# Patient Record
Sex: Female | Born: 1960
Health system: Southern US, Community
[De-identification: ages and names within clinical notes are randomized; demographics above are authoritative.]

## PROBLEM LIST (undated history)

## (undated) DIAGNOSIS — E785 Hyperlipidemia, unspecified: Secondary | ICD-10-CM

## (undated) DIAGNOSIS — K219 Gastro-esophageal reflux disease without esophagitis: Secondary | ICD-10-CM

## (undated) DIAGNOSIS — E079 Disorder of thyroid, unspecified: Secondary | ICD-10-CM

## (undated) DIAGNOSIS — C50919 Malignant neoplasm of unspecified site of unspecified female breast: Secondary | ICD-10-CM

## (undated) DIAGNOSIS — E039 Hypothyroidism, unspecified: Secondary | ICD-10-CM

## (undated) HISTORY — DX: Malignant neoplasm of unspecified site of unspecified female breast: C50.919

## (undated) HISTORY — DX: Hyperlipidemia, unspecified: E78.5

## (undated) HISTORY — DX: Disorder of thyroid, unspecified: E07.9

## (undated) HISTORY — DX: Gastro-esophageal reflux disease without esophagitis: K21.9

---

## 1998-08-23 ENCOUNTER — Encounter: Admission: RE | Admit: 1998-08-23 | Discharge: 1998-11-21 | Payer: Self-pay | Admitting: Gynecology

## 1998-10-24 ENCOUNTER — Inpatient Hospital Stay (HOSPITAL_COMMUNITY): Admission: AD | Admit: 1998-10-24 | Discharge: 1998-10-26 | Payer: Self-pay | Admitting: Obstetrics and Gynecology

## 1999-12-04 ENCOUNTER — Other Ambulatory Visit: Admission: RE | Admit: 1999-12-04 | Discharge: 1999-12-04 | Payer: Self-pay | Admitting: Obstetrics and Gynecology

## 2000-03-17 ENCOUNTER — Encounter: Admission: RE | Admit: 2000-03-17 | Discharge: 2000-03-17 | Payer: Self-pay | Admitting: Internal Medicine

## 2000-03-17 ENCOUNTER — Encounter: Payer: Self-pay | Admitting: Internal Medicine

## 2002-03-20 ENCOUNTER — Other Ambulatory Visit: Admission: RE | Admit: 2002-03-20 | Discharge: 2002-03-20 | Payer: Self-pay | Admitting: Obstetrics and Gynecology

## 2003-05-04 ENCOUNTER — Other Ambulatory Visit: Admission: RE | Admit: 2003-05-04 | Discharge: 2003-05-04 | Payer: Self-pay | Admitting: *Deleted

## 2004-05-30 ENCOUNTER — Other Ambulatory Visit: Admission: RE | Admit: 2004-05-30 | Discharge: 2004-05-30 | Payer: Self-pay | Admitting: Obstetrics and Gynecology

## 2005-08-04 ENCOUNTER — Other Ambulatory Visit: Admission: RE | Admit: 2005-08-04 | Discharge: 2005-08-04 | Payer: Self-pay | Admitting: Obstetrics and Gynecology

## 2006-11-23 ENCOUNTER — Other Ambulatory Visit: Admission: RE | Admit: 2006-11-23 | Discharge: 2006-11-23 | Payer: Self-pay | Admitting: Obstetrics and Gynecology

## 2007-09-27 ENCOUNTER — Encounter: Admission: RE | Admit: 2007-09-27 | Discharge: 2007-09-27 | Payer: Self-pay | Admitting: Internal Medicine

## 2008-01-17 ENCOUNTER — Other Ambulatory Visit: Admission: RE | Admit: 2008-01-17 | Discharge: 2008-01-17 | Payer: Self-pay | Admitting: Obstetrics & Gynecology

## 2008-12-18 ENCOUNTER — Ambulatory Visit: Payer: Self-pay | Admitting: Internal Medicine

## 2009-06-24 ENCOUNTER — Ambulatory Visit: Payer: Self-pay | Admitting: Internal Medicine

## 2010-01-14 ENCOUNTER — Ambulatory Visit: Payer: Self-pay | Admitting: Internal Medicine

## 2010-04-18 ENCOUNTER — Ambulatory Visit: Payer: Self-pay | Admitting: Internal Medicine

## 2010-07-22 ENCOUNTER — Ambulatory Visit: Payer: Self-pay | Admitting: Internal Medicine

## 2010-12-30 ENCOUNTER — Encounter (INDEPENDENT_AMBULATORY_CARE_PROVIDER_SITE_OTHER): Payer: 59 | Admitting: Internal Medicine

## 2010-12-30 DIAGNOSIS — E559 Vitamin D deficiency, unspecified: Secondary | ICD-10-CM

## 2010-12-30 DIAGNOSIS — E785 Hyperlipidemia, unspecified: Secondary | ICD-10-CM

## 2010-12-30 DIAGNOSIS — E039 Hypothyroidism, unspecified: Secondary | ICD-10-CM

## 2011-04-13 ENCOUNTER — Other Ambulatory Visit: Payer: Self-pay | Admitting: Internal Medicine

## 2011-06-14 ENCOUNTER — Other Ambulatory Visit: Payer: Self-pay | Admitting: Internal Medicine

## 2011-06-24 ENCOUNTER — Encounter: Payer: Self-pay | Admitting: Internal Medicine

## 2011-06-26 ENCOUNTER — Encounter: Payer: Self-pay | Admitting: Internal Medicine

## 2011-06-26 ENCOUNTER — Ambulatory Visit (INDEPENDENT_AMBULATORY_CARE_PROVIDER_SITE_OTHER): Payer: 59 | Admitting: Internal Medicine

## 2011-06-26 VITALS — BP 134/78 | HR 80 | Temp 97.9°F | Ht 61.25 in | Wt 131.0 lb

## 2011-06-26 DIAGNOSIS — Z23 Encounter for immunization: Secondary | ICD-10-CM

## 2011-06-26 DIAGNOSIS — E559 Vitamin D deficiency, unspecified: Secondary | ICD-10-CM

## 2011-06-26 DIAGNOSIS — E039 Hypothyroidism, unspecified: Secondary | ICD-10-CM

## 2011-06-26 DIAGNOSIS — E785 Hyperlipidemia, unspecified: Secondary | ICD-10-CM

## 2011-06-26 DIAGNOSIS — K219 Gastro-esophageal reflux disease without esophagitis: Secondary | ICD-10-CM

## 2011-06-26 LAB — TSH: TSH: 0.808 u[IU]/mL (ref 0.350–4.500)

## 2011-06-28 ENCOUNTER — Encounter: Payer: Self-pay | Admitting: Internal Medicine

## 2011-06-29 DIAGNOSIS — E785 Hyperlipidemia, unspecified: Secondary | ICD-10-CM | POA: Insufficient documentation

## 2011-06-29 DIAGNOSIS — K219 Gastro-esophageal reflux disease without esophagitis: Secondary | ICD-10-CM | POA: Insufficient documentation

## 2011-06-29 DIAGNOSIS — E039 Hypothyroidism, unspecified: Secondary | ICD-10-CM | POA: Insufficient documentation

## 2011-06-29 DIAGNOSIS — E559 Vitamin D deficiency, unspecified: Secondary | ICD-10-CM | POA: Insufficient documentation

## 2011-06-29 NOTE — Progress Notes (Signed)
  Subjective:    Patient ID: Christie Molina, female    DOB: July 25, 1961, 50 y.o.   MRN: [id number]  HPI 50 year old white female with history of vitamin D deficiency, hyperlipidemia, hypothyroidism, GE reflux for six-month recheck. Doing well with these health issues without complaints. Having issues with mother who has dementia. It has been stressful for her.    Review of Systems     Objective:   Physical Exam neck is supple without thyromegaly; chest clear to auscultation; cardiac exam regular rate and rhythm normal S1 and S2        Assessment & Plan:  Hypothyroidism  GE reflux  Hyperlipidemia  History of vitamin D deficiency  Plan is to continue same dose of Synthroid. In 6 months she will have physical examination and fasting lab studies

## 2011-06-30 ENCOUNTER — Other Ambulatory Visit: Payer: Self-pay | Admitting: Internal Medicine

## 2011-07-01 ENCOUNTER — Other Ambulatory Visit: Payer: Self-pay

## 2011-07-01 MED ORDER — PROTONIX 40 MG PO TBEC: 40.0000 mg | DELAYED_RELEASE_TABLET | Freq: Every day | ORAL | Status: DC

## 2011-07-14 ENCOUNTER — Other Ambulatory Visit: Payer: Self-pay | Admitting: Internal Medicine

## 2011-08-13 ENCOUNTER — Other Ambulatory Visit: Payer: Self-pay | Admitting: Internal Medicine

## 2011-09-11 ENCOUNTER — Other Ambulatory Visit: Payer: Self-pay | Admitting: Internal Medicine

## 2011-09-28 ENCOUNTER — Encounter: Payer: Self-pay | Admitting: Internal Medicine

## 2011-12-23 ENCOUNTER — Other Ambulatory Visit: Payer: Self-pay | Admitting: Internal Medicine

## 2011-12-23 LAB — TSH: TSH: 0.906 u[IU]/mL (ref 0.350–4.500)

## 2011-12-23 LAB — CBC WITH DIFFERENTIAL/PLATELET
Basophils Absolute: 0.1 10*3/uL (ref 0.0–0.1)
Eosinophils Absolute: 0.1 10*3/uL (ref 0.0–0.7)
Eosinophils Relative: 1 % (ref 0–5)
MCH: 29.5 pg (ref 26.0–34.0)
MCV: 91.7 fL (ref 78.0–100.0)
Neutrophils Relative %: 64 % (ref 43–77)
Platelets: 195 10*3/uL (ref 150–400)
RBC: 4.47 MIL/uL (ref 3.87–5.11)
RDW: 13 % (ref 11.5–15.5)
WBC: 5 10*3/uL (ref 4.0–10.5)

## 2011-12-23 LAB — COMPREHENSIVE METABOLIC PANEL
ALT: 11 U/L (ref 0–35)
AST: 16 U/L (ref 0–37)
Alkaline Phosphatase: 35 U/L — ABNORMAL LOW (ref 39–117)
Creat: 0.79 mg/dL (ref 0.50–1.10)
Sodium: 139 mEq/L (ref 135–145)
Total Bilirubin: 0.5 mg/dL (ref 0.3–1.2)
Total Protein: 6.3 g/dL (ref 6.0–8.3)

## 2011-12-23 LAB — LIPID PANEL
Cholesterol: 179 mg/dL (ref 0–200)
LDL Cholesterol: 100 mg/dL — ABNORMAL HIGH (ref 0–99)
Total CHOL/HDL Ratio: 2.7 Ratio
VLDL: 12 mg/dL (ref 0–40)

## 2011-12-24 ENCOUNTER — Other Ambulatory Visit: Payer: 59 | Admitting: Internal Medicine

## 2011-12-24 LAB — VITAMIN D 25 HYDROXY (VIT D DEFICIENCY, FRACTURES): Vit D, 25-Hydroxy: 21 ng/mL — ABNORMAL LOW (ref 30–89)

## 2011-12-25 ENCOUNTER — Other Ambulatory Visit: Payer: Self-pay | Admitting: Internal Medicine

## 2011-12-25 ENCOUNTER — Encounter: Payer: Self-pay | Admitting: Internal Medicine

## 2011-12-25 ENCOUNTER — Ambulatory Visit (INDEPENDENT_AMBULATORY_CARE_PROVIDER_SITE_OTHER): Payer: 59 | Admitting: Internal Medicine

## 2011-12-25 DIAGNOSIS — E785 Hyperlipidemia, unspecified: Secondary | ICD-10-CM

## 2011-12-25 DIAGNOSIS — Z87898 Personal history of other specified conditions: Secondary | ICD-10-CM

## 2011-12-25 DIAGNOSIS — F419 Anxiety disorder, unspecified: Secondary | ICD-10-CM

## 2011-12-25 DIAGNOSIS — K219 Gastro-esophageal reflux disease without esophagitis: Secondary | ICD-10-CM

## 2011-12-25 DIAGNOSIS — Z0000 Encounter for general adult medical examination without abnormal findings: Secondary | ICD-10-CM

## 2011-12-25 DIAGNOSIS — F411 Generalized anxiety disorder: Secondary | ICD-10-CM

## 2011-12-25 DIAGNOSIS — Z8639 Personal history of other endocrine, nutritional and metabolic disease: Secondary | ICD-10-CM

## 2011-12-25 DIAGNOSIS — E039 Hypothyroidism, unspecified: Secondary | ICD-10-CM

## 2011-12-25 LAB — POCT URINALYSIS DIPSTICK
Bilirubin, UA: NEGATIVE
Ketones, UA: NEGATIVE
Protein, UA: NEGATIVE

## 2011-12-27 LAB — URINE CULTURE: Colony Count: >=100000

## 2011-12-27 NOTE — Patient Instructions (Signed)
Continue same medications and return in 6 months 
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
## 2011-12-29 ENCOUNTER — Encounter: Payer: Self-pay | Admitting: Internal Medicine

## 2012-01-11 ENCOUNTER — Other Ambulatory Visit: Payer: Self-pay | Admitting: Internal Medicine

## 2012-06-27 ENCOUNTER — Other Ambulatory Visit: Payer: Self-pay

## 2012-06-27 MED ORDER — PROTONIX 40 MG PO TBEC: 40.0000 mg | DELAYED_RELEASE_TABLET | Freq: Every day | ORAL | Status: DC

## 2012-07-13 ENCOUNTER — Other Ambulatory Visit: Payer: Self-pay | Admitting: Internal Medicine

## 2012-07-13 LAB — LIPID PANEL
LDL Cholesterol: 125 mg/dL — ABNORMAL HIGH (ref 0–99)
Total CHOL/HDL Ratio: 3.4 Ratio

## 2012-07-13 LAB — HEPATIC FUNCTION PANEL
AST: 13 U/L (ref 0–37)
Bilirubin, Direct: 0.1 mg/dL (ref 0.0–0.3)
Total Bilirubin: 0.6 mg/dL (ref 0.3–1.2)

## 2012-07-13 LAB — TSH: TSH: 1.068 u[IU]/mL (ref 0.350–4.500)

## 2012-07-18 ENCOUNTER — Ambulatory Visit (INDEPENDENT_AMBULATORY_CARE_PROVIDER_SITE_OTHER): Payer: 59 | Admitting: Internal Medicine

## 2012-07-18 ENCOUNTER — Encounter: Payer: Self-pay | Admitting: Internal Medicine

## 2012-07-18 VITALS — BP 128/76 | HR 80 | Temp 98.7°F | Wt 135.0 lb

## 2012-07-18 DIAGNOSIS — E785 Hyperlipidemia, unspecified: Secondary | ICD-10-CM

## 2012-07-18 DIAGNOSIS — Z23 Encounter for immunization: Secondary | ICD-10-CM

## 2012-07-18 DIAGNOSIS — E039 Hypothyroidism, unspecified: Secondary | ICD-10-CM

## 2012-07-18 DIAGNOSIS — Z733 Stress, not elsewhere classified: Secondary | ICD-10-CM

## 2012-07-18 DIAGNOSIS — K219 Gastro-esophageal reflux disease without esophagitis: Secondary | ICD-10-CM

## 2012-07-18 DIAGNOSIS — F439 Reaction to severe stress, unspecified: Secondary | ICD-10-CM

## 2012-07-18 NOTE — Progress Notes (Signed)
  Subjective:    Patient ID: Christie Molina, female    DOB: April 15, 1961, 51 y.o.   MRN: [id number]  HPI 51 year old White female for 6 month recheck. Hx hyperlipidemia and hypothyroidism. TSH normal and  LDL has increased. Under stress with job and caring for mother with dementia. History of GE reflux.    Review of Systems     Objective:   Physical Exam Neck supple; Chest clear to auscultation; Corr RRR; No thyromegaly. Skin is warm and dry. Extremities without edema.        Assessment & Plan:  Hypothyroidism  Hyperlipidemia  Situational stress  History of GE reflux  Plan: Labs reviewed. Watch diet and try to get more exercise. Also under stress at work. Stress with mother with dementia. Return in 6 months for physical exam.

## 2012-07-21 ENCOUNTER — Ambulatory Visit: Payer: 59 | Admitting: Internal Medicine

## 2012-08-07 ENCOUNTER — Other Ambulatory Visit: Payer: Self-pay | Admitting: Internal Medicine

## 2012-08-07 NOTE — Patient Instructions (Addendum)
Continue same medications and return in 6 months. Watch diet and try to get some exercise.

## 2012-08-17 ENCOUNTER — Other Ambulatory Visit: Payer: Self-pay | Admitting: Internal Medicine

## 2012-10-10 ENCOUNTER — Other Ambulatory Visit: Payer: Self-pay | Admitting: Internal Medicine

## 2012-10-21 ENCOUNTER — Other Ambulatory Visit: Payer: Self-pay

## 2012-10-21 MED ORDER — SIMVASTATIN 20 MG PO TABS: 20.0000 mg | ORAL_TABLET | Freq: Every day | ORAL | Status: DC

## 2012-11-01 LAB — HM MAMMOGRAPHY

## 2012-12-30 ENCOUNTER — Encounter: Payer: 59 | Admitting: Internal Medicine

## 2013-01-26 ENCOUNTER — Other Ambulatory Visit: Payer: Self-pay | Admitting: Internal Medicine

## 2013-01-26 LAB — COMPREHENSIVE METABOLIC PANEL
AST: 14 U/L (ref 0–37)
Albumin: 3.8 g/dL (ref 3.5–5.2)
BUN: 11 mg/dL (ref 6–23)
Calcium: 9.1 mg/dL (ref 8.4–10.5)
Chloride: 107 mEq/L (ref 96–112)
Glucose, Bld: 82 mg/dL (ref 70–99)
Potassium: 4.3 mEq/L (ref 3.5–5.3)
Sodium: 141 mEq/L (ref 135–145)
Total Protein: 6.4 g/dL (ref 6.0–8.3)

## 2013-01-26 LAB — CBC WITH DIFFERENTIAL/PLATELET
HCT: 39.2 % (ref 36.0–46.0)
Hemoglobin: 13.5 g/dL (ref 12.0–15.0)
Lymphocytes Relative: 26 % (ref 12–46)
Monocytes Absolute: 0.4 10*3/uL (ref 0.1–1.0)
Monocytes Relative: 7 % (ref 3–12)
Neutro Abs: 3.5 10*3/uL (ref 1.7–7.7)
Neutrophils Relative %: 65 % (ref 43–77)
RBC: 4.54 MIL/uL (ref 3.87–5.11)
WBC: 5.4 10*3/uL (ref 4.0–10.5)

## 2013-01-26 LAB — TSH: TSH: 1.037 u[IU]/mL (ref 0.350–4.500)

## 2013-01-26 LAB — LIPID PANEL
HDL: 68 mg/dL (ref >39)
LDL Cholesterol: 115 mg/dL — ABNORMAL HIGH (ref 0–99)
Triglycerides: 81 mg/dL (ref <150)

## 2013-01-30 ENCOUNTER — Encounter: Payer: Self-pay | Admitting: Internal Medicine

## 2013-01-30 ENCOUNTER — Ambulatory Visit (INDEPENDENT_AMBULATORY_CARE_PROVIDER_SITE_OTHER): Payer: 59 | Admitting: Internal Medicine

## 2013-01-30 VITALS — BP 122/80 | HR 76 | Temp 98.1°F | Ht 61.25 in | Wt 136.0 lb

## 2013-01-30 DIAGNOSIS — E785 Hyperlipidemia, unspecified: Secondary | ICD-10-CM

## 2013-01-30 DIAGNOSIS — Z8639 Personal history of other endocrine, nutritional and metabolic disease: Secondary | ICD-10-CM

## 2013-01-30 DIAGNOSIS — K219 Gastro-esophageal reflux disease without esophagitis: Secondary | ICD-10-CM

## 2013-01-30 DIAGNOSIS — Z0000 Encounter for general adult medical examination without abnormal findings: Secondary | ICD-10-CM

## 2013-01-30 DIAGNOSIS — E039 Hypothyroidism, unspecified: Secondary | ICD-10-CM

## 2013-01-30 DIAGNOSIS — F411 Generalized anxiety disorder: Secondary | ICD-10-CM

## 2013-01-30 LAB — POCT URINALYSIS DIPSTICK
Glucose, UA: NEGATIVE
Leukocytes, UA: NEGATIVE
Protein, UA: NEGATIVE
Spec Grav, UA: <=1.005
Urobilinogen, UA: NEGATIVE

## 2013-03-06 ENCOUNTER — Other Ambulatory Visit: Payer: Self-pay | Admitting: Internal Medicine

## 2013-03-29 ENCOUNTER — Telehealth: Payer: Self-pay

## 2013-03-29 DIAGNOSIS — J069 Acute upper respiratory infection, unspecified: Secondary | ICD-10-CM

## 2013-03-29 MED ORDER — AZITHROMYCIN 250 MG PO TABS: ORAL_TABLET | ORAL | Status: DC

## 2013-03-29 NOTE — Telephone Encounter (Signed)
Patient calls this am with sore throat, earache and colored nasal drainage. No fever or chills. Mom is in the hospital with possible discharge today. OK per verbal order of Dr. Lenord Fellers to call in a Z Pak to her pharmacy. Patient advised of this.

## 2013-06-13 ENCOUNTER — Ambulatory Visit: Payer: Self-pay | Admitting: Nurse Practitioner

## 2013-06-28 ENCOUNTER — Encounter: Payer: Self-pay | Admitting: Nurse Practitioner

## 2013-06-29 ENCOUNTER — Encounter: Payer: Self-pay | Admitting: Nurse Practitioner

## 2013-06-29 ENCOUNTER — Ambulatory Visit (INDEPENDENT_AMBULATORY_CARE_PROVIDER_SITE_OTHER): Payer: 59 | Admitting: Nurse Practitioner

## 2013-06-29 VITALS — BP 100/62 | HR 72 | Resp 16 | Ht 61.75 in | Wt 135.0 lb

## 2013-06-29 DIAGNOSIS — E559 Vitamin D deficiency, unspecified: Secondary | ICD-10-CM

## 2013-06-29 DIAGNOSIS — Z01419 Encounter for gynecological examination (general) (routine) without abnormal findings: Secondary | ICD-10-CM

## 2013-06-29 NOTE — Progress Notes (Signed)
Patient ID: Christie Molina, female   DOB: 07-Feb-1961, 52 y.o.   MRN: [id number] 52 y.o. [id number] Married Caucasian Fe here for annual exam.   Menses still at 3-4 days. Light.  This summer Vit D level was very low at 41 and took Vit D RX weekly X 12 weeks.  As we discussed last year, the plan was to take her off OCP this year.  She is still OK with plan.  Patient's last menstrual period was 06/22/2013.          Sexually active: yes  The current method of family planning is OCP (estrogen/progesterone).    Exercising: no  The patient does not participate in regular exercise at present. Smoker:  no  Health Maintenance: Pap:  06/03/12, WNL, NEG HR HPV MMG:  11/01/12, BI-Rads 2: benign findings TDaP:  12/2011 Labs: PCP   reports that she has quit smoking. Her smoking use included Cigarettes. She smoked 0.00 packs per day. She has never used smokeless tobacco. She reports that  drinks alcohol. She reports that she does not use illicit drugs.  Past Medical History  Diagnosis Date  . GERD (gastroesophageal reflux disease)   . Hyperlipidemia   . Thyroid disease   . Vitamin D deficiency     History reviewed. No pertinent past surgical history.  Current Outpatient Prescriptions  Medication Sig Dispense Refill  . Ergocalciferol (VITAMIN D2) 2000 UNITS TABS Take 1 tablet by mouth daily.       Marland Kitchen levonorgestrel-ethinyl estradiol (AVIANE,ALESSE,LESSINA) 0.1-20 MG-MCG tablet Take 1 tablet by mouth daily.        Marland Kitchen PROTONIX 40 MG tablet Take 1 tablet (40 mg total) by mouth daily.  90 tablet  3  . simvastatin (ZOCOR) 20 MG tablet Take 1 tablet (20 mg total) by mouth at bedtime.  30 tablet  3  . SYNTHROID 112 MCG tablet TAKE 1 TABLET BY MOUTH DAILY  30 tablet  5   No current facility-administered medications for this visit.    Family History  Problem Relation Age of Onset  . Heart disease Father     ROS:  Pertinent items are noted in HPI.  Otherwise, a comprehensive ROS was negative.  Exam:   BP  100/62  Pulse 72  Resp 16  Ht 5' 1.75" (1.568 m)  Wt 135 lb (61.236 kg)  BMI 24.91 kg/m2  LMP 06/22/2013 Height: 5' 1.75" (156.8 cm)  Ht Readings from Last 3 Encounters:  06/29/13 5' 1.75" (1.568 m)  01/30/13 5' 1.25" (1.556 m)  12/25/11 5' 0.25" (1.53 m)    General appearance: alert, cooperative and appears stated age Head: Normocephalic, without obvious abnormality, atraumatic Neck: no adenopathy, supple, symmetrical, trachea midline and thyroid normal to inspection and palpation Lungs: clear to auscultation bilaterally Breasts: normal appearance, no masses or tenderness Heart: regular rate and rhythm Abdomen: soft, non-tender; no masses,  no organomegaly Extremities: extremities normal, atraumatic, no cyanosis or edema Skin: Skin color, texture, turgor normal. No rashes or lesions Lymph nodes: Cervical, supraclavicular, and axillary nodes normal. No abnormal inguinal nodes palpated Neurologic: Grossly normal   Pelvic: External genitalia:  no lesions              Urethra:  normal appearing urethra with no masses, tenderness or lesions              Bartholin's and Skene's: normal                 Vagina: normal appearing vagina with normal  color and discharge, no lesions              Cervix: anteverted              Pap taken: no Bimanual Exam:  Uterus:  normal size, contour, position, consistency, mobility, non-tender              Adnexa: no mass, fullness, tenderness               Rectovaginal: Confirms               Anus:  normal sphincter tone, no lesions  A:  Well Woman with normal exam  OCP for cycle regulation  Perimenopausal  Hypothyroid, Vit D deficiency, GERD  P:   Pap smear as per guidelines   Will DC OCP after this cycle, she will then call back in 3 months with an update on menses and any menopausal symptoms  BUM for BC in the interim  Mammogram due 1/15  Will repeat Vit D for patient and follow with results  Counseled on breast self exam, adequate intake  of calcium and vitamin D, diet and exercise, Kegel's exercises return annually or prn  An After Visit Summary was printed and given to the patient.

## 2013-06-29 NOTE — Patient Instructions (Addendum)

## 2013-06-30 LAB — VITAMIN D 25 HYDROXY (VIT D DEFICIENCY, FRACTURES): Vit D, 25-Hydroxy: 32 ng/mL (ref 30–89)

## 2013-07-03 NOTE — Progress Notes (Signed)
Encounter reviewed by Dr. Brook Silva.  

## 2013-07-04 ENCOUNTER — Telehealth: Payer: Self-pay | Admitting: *Deleted

## 2013-07-04 NOTE — Telephone Encounter (Signed)
Message copied by Luisa Dago on Tue Jul 04, 2013  5:01 PM ------      Message from: Ria Comment R      Created: Fri Jun 30, 2013 11:02 AM       Let her know Vit D is better than this summer - she can let PCP decide if more Vit D is needed or to change to OTC. ------

## 2013-07-04 NOTE — Telephone Encounter (Signed)
I have attempted to contact this patient by phone with the following results: left message to return my call on answering machine (home).  

## 2013-07-05 NOTE — Telephone Encounter (Signed)
Pt.notified

## 2013-07-06 ENCOUNTER — Encounter: Payer: Self-pay | Admitting: Internal Medicine

## 2013-07-06 DIAGNOSIS — F411 Generalized anxiety disorder: Secondary | ICD-10-CM | POA: Insufficient documentation

## 2013-07-06 NOTE — Patient Instructions (Addendum)
Continue same medications and return in 6 months 
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
## 2013-07-25 ENCOUNTER — Encounter: Payer: Self-pay | Admitting: Internal Medicine

## 2013-07-25 ENCOUNTER — Ambulatory Visit (INDEPENDENT_AMBULATORY_CARE_PROVIDER_SITE_OTHER): Payer: 59 | Admitting: Internal Medicine

## 2013-07-25 VITALS — BP 118/72 | HR 72 | Temp 96.4°F | Ht 62.0 in | Wt 140.0 lb

## 2013-07-25 DIAGNOSIS — Z1329 Encounter for screening for other suspected endocrine disorder: Secondary | ICD-10-CM

## 2013-07-25 DIAGNOSIS — K219 Gastro-esophageal reflux disease without esophagitis: Secondary | ICD-10-CM

## 2013-07-25 DIAGNOSIS — E785 Hyperlipidemia, unspecified: Secondary | ICD-10-CM

## 2013-07-25 DIAGNOSIS — Z23 Encounter for immunization: Secondary | ICD-10-CM

## 2013-07-25 DIAGNOSIS — E039 Hypothyroidism, unspecified: Secondary | ICD-10-CM

## 2013-07-25 LAB — TSH: TSH: 0.384 u[IU]/mL (ref 0.350–4.500)

## 2013-07-25 NOTE — Patient Instructions (Signed)
TSH is pending. Flu vaccine given today. Return in 6 months for physical exam.

## 2013-07-25 NOTE — Progress Notes (Signed)
  Subjective:    Patient ID: Christie Molina, female    DOB: December 29, 1960, 52 y.o.   MRN: [id number]  HPI 52 year old female in today to followup on hypothyroidism. TSH drawn today. She also has history of hyperlipidemia and is on statin medication. Mother has dementia and is in a nursing home. Patient also has history of GE reflux.   Review of Systems     Objective:   Physical Exam Neck is supple without thyromegaly. Chest clear to auscultation. Cardiac exam regular rate and rhythm. Skin is warm and dry. Extremities without edema.        Assessment & Plan:  Hypothyroidism-TSH pending  GE reflux-stable on PPI  Hyperlipidemia-lipid panel not checked today  Plan: Flu vaccine given today. TSH is pending. Return in 6 months for physical examination.  Spent 25 minutes with patient speaking with her about medical issues and about her mother's illness

## 2013-08-15 ENCOUNTER — Other Ambulatory Visit: Payer: Self-pay | Admitting: Internal Medicine

## 2013-08-30 ENCOUNTER — Other Ambulatory Visit: Payer: Self-pay | Admitting: Internal Medicine

## 2013-09-07 ENCOUNTER — Encounter: Payer: Self-pay | Admitting: Internal Medicine

## 2013-09-07 ENCOUNTER — Ambulatory Visit (INDEPENDENT_AMBULATORY_CARE_PROVIDER_SITE_OTHER): Payer: 59 | Admitting: Internal Medicine

## 2013-09-07 VITALS — BP 120/80 | HR 80 | Temp 97.7°F | Ht 61.25 in | Wt 134.0 lb

## 2013-09-07 DIAGNOSIS — F411 Generalized anxiety disorder: Secondary | ICD-10-CM

## 2013-09-12 MED ORDER — ALPRAZOLAM 0.25 MG PO TABS: 0.2500 mg | ORAL_TABLET | Freq: Two times a day (BID) | ORAL | Status: DC | PRN
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
## 2013-09-12 NOTE — Patient Instructions (Addendum)
Take Xanax as needed for anxiety.

## 2013-09-26 ENCOUNTER — Telehealth: Payer: Self-pay | Admitting: Nurse Practitioner

## 2013-09-26 NOTE — Telephone Encounter (Signed)
Ok to do Provera Challenge 10 mg for 10 days.  Then have her to call with +/- results about 10-14 days later.

## 2013-09-26 NOTE — Telephone Encounter (Signed)
Patient Is calling because she was seen in October by patty they took her off of the [facility] and patty told her to call in a few months if she hadnt started her cycle. She has not started her cycle.

## 2013-09-26 NOTE — Telephone Encounter (Signed)
Patient had AEX 06-29-13 with Patty and OCP were discontinued.   She was instructed to call back with update on cycles. No contraception, states no possibility of pregnancy, patient very vague in her answers and can not talk because she is at work.  I am having to guess answers to my questions so she can answer with yes/no answers. States LMP 07-20-13. Usually cycles Q month.  Advised will send to [facility] for review and call her back but maybe tomorrow.

## 2013-10-03 MED ORDER — MEDROXYPROGESTERONE ACETATE 10 MG PO TABS: 10.0000 mg | ORAL_TABLET | Freq: Every day | ORAL | Status: DC

## 2013-10-03 NOTE — Telephone Encounter (Signed)
Spoke with patient. Message from Lauro Franklin, FNP given, verbalized understanding. Rx sent. . Will call us with results of provera withdrawal.

## 2014-01-25 ENCOUNTER — Other Ambulatory Visit: Payer: Self-pay | Admitting: Internal Medicine

## 2014-01-25 LAB — CBC WITH DIFFERENTIAL/PLATELET
Basophils Absolute: 0 10*3/uL (ref 0.0–0.1)
Basophils Relative: 1 % (ref 0–1)
EOS ABS: 0.1 10*3/uL (ref 0.0–0.7)
Eosinophils Relative: 3 % (ref 0–5)
HCT: 39.1 % (ref 36.0–46.0)
HEMOGLOBIN: 13.8 g/dL (ref 12.0–15.0)
LYMPHS ABS: 1.4 10*3/uL (ref 0.7–4.0)
Lymphocytes Relative: 38 % (ref 12–46)
MCH: 28.9 pg (ref 26.0–34.0)
MCHC: 35.3 g/dL (ref 30.0–36.0)
MCV: 82 fL (ref 78.0–100.0)
MONOS PCT: 11 % (ref 3–12)
Monocytes Absolute: 0.4 10*3/uL (ref 0.1–1.0)
Neutro Abs: 1.8 10*3/uL (ref 1.7–7.7)
Neutrophils Relative %: 47 % (ref 43–77)
PLATELETS: 216 10*3/uL (ref 150–400)
RBC: 4.77 MIL/uL (ref 3.87–5.11)
RDW: 13.6 % (ref 11.5–15.5)
WBC: 3.8 10*3/uL — ABNORMAL LOW (ref 4.0–10.5)

## 2014-01-25 LAB — COMPREHENSIVE METABOLIC PANEL
ALBUMIN: 4.2 g/dL (ref 3.5–5.2)
ALT: 17 U/L (ref 0–35)
AST: 19 U/L (ref 0–37)
Alkaline Phosphatase: 55 U/L (ref 39–117)
BILIRUBIN TOTAL: 0.5 mg/dL (ref 0.2–1.2)
BUN: 13 mg/dL (ref 6–23)
CO2: 29 meq/L (ref 19–32)
Calcium: 9.3 mg/dL (ref 8.4–10.5)
Chloride: 103 mEq/L (ref 96–112)
Creat: 0.87 mg/dL (ref 0.50–1.10)
Glucose, Bld: 84 mg/dL (ref 70–99)
POTASSIUM: 4.4 meq/L (ref 3.5–5.3)
SODIUM: 141 meq/L (ref 135–145)
Total Protein: 6.6 g/dL (ref 6.0–8.3)

## 2014-01-25 LAB — LIPID PANEL
CHOLESTEROL: 231 mg/dL — AB (ref 0–200)
HDL: 87 mg/dL (ref >39)
LDL Cholesterol: 132 mg/dL — ABNORMAL HIGH (ref 0–99)
TRIGLYCERIDES: 62 mg/dL (ref <150)
Total CHOL/HDL Ratio: 2.7 Ratio
VLDL: 12 mg/dL (ref 0–40)

## 2014-01-25 LAB — TSH: TSH: 0.057 u[IU]/mL — ABNORMAL LOW (ref 0.350–4.500)

## 2014-01-26 LAB — VITAMIN D 25 HYDROXY (VIT D DEFICIENCY, FRACTURES): VIT D 25 HYDROXY: 26 ng/mL — AB (ref 30–89)

## 2014-02-01 ENCOUNTER — Ambulatory Visit (INDEPENDENT_AMBULATORY_CARE_PROVIDER_SITE_OTHER): Payer: 59 | Admitting: Internal Medicine

## 2014-02-01 ENCOUNTER — Encounter: Payer: Self-pay | Admitting: Internal Medicine

## 2014-02-01 VITALS — BP 128/84 | HR 80 | Temp 98.8°F | Ht 61.0 in | Wt 136.5 lb

## 2014-02-01 DIAGNOSIS — Z9119 Patient's noncompliance with other medical treatment and regimen: Secondary | ICD-10-CM

## 2014-02-01 DIAGNOSIS — R82998 Other abnormal findings in urine: Secondary | ICD-10-CM

## 2014-02-01 DIAGNOSIS — E039 Hypothyroidism, unspecified: Secondary | ICD-10-CM

## 2014-02-01 DIAGNOSIS — Z9114 Patient's other noncompliance with medication regimen: Secondary | ICD-10-CM

## 2014-02-01 DIAGNOSIS — E559 Vitamin D deficiency, unspecified: Secondary | ICD-10-CM

## 2014-02-01 DIAGNOSIS — Z0000 Encounter for general adult medical examination without abnormal findings: Secondary | ICD-10-CM

## 2014-02-01 DIAGNOSIS — E785 Hyperlipidemia, unspecified: Secondary | ICD-10-CM

## 2014-02-01 DIAGNOSIS — Z91199 Patient's noncompliance with other medical treatment and regimen due to unspecified reason: Secondary | ICD-10-CM

## 2014-02-01 LAB — POCT URINALYSIS DIPSTICK
BILIRUBIN UA: NEGATIVE
Glucose, UA: NEGATIVE
KETONES UA: NEGATIVE
NITRITE UA: NEGATIVE
PH UA: 6.5
Protein, UA: NEGATIVE
Spec Grav, UA: <=1.005
Urobilinogen, UA: NEGATIVE

## 2014-02-01 MED ORDER — SYNTHROID 112 MCG PO TABS: ORAL_TABLET | ORAL | Status: DC

## 2014-02-01 MED ORDER — SIMVASTATIN 20 MG PO TABS: 20.0000 mg | ORAL_TABLET | Freq: Every day | ORAL | Status: DC
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
## 2014-02-01 NOTE — Patient Instructions (Addendum)
TSH is low but continue same dose of Synthroid and come by in 2 months to have TSH rechecked. Otherwise restart lipid-lowering medication and followup here in 6 months for six-month recheck with office visit, lipid panel, liver functions and TSH if compliant with statin medication. We will arrange for colonoscopy. You will be due for mammogram in January. Please have bone density study done then. Take 2000 units vitamin D 3 daily

## 2014-02-02 LAB — URINALYSIS, MICROSCOPIC ONLY
Casts: NONE SEEN
Crystals: NONE SEEN

## 2014-02-02 LAB — URINE CULTURE
COLONY COUNT: NO GROWTH
Organism ID, Bacteria: NO GROWTH

## 2014-02-02 LAB — URINALYSIS, ROUTINE W REFLEX MICROSCOPIC
Bilirubin Urine: NEGATIVE
Glucose, UA: NEGATIVE mg/dL
HGB URINE DIPSTICK: NEGATIVE
Ketones, ur: NEGATIVE mg/dL
Nitrite: NEGATIVE
PROTEIN: NEGATIVE mg/dL
Specific Gravity, Urine: 1.006 (ref 1.005–1.030)
UROBILINOGEN UA: 0.2 mg/dL (ref 0.0–1.0)
pH: 6.5 (ref 5.0–8.0)

## 2014-04-12 ENCOUNTER — Other Ambulatory Visit: Payer: 59 | Admitting: Internal Medicine

## 2014-04-13 ENCOUNTER — Other Ambulatory Visit (INDEPENDENT_AMBULATORY_CARE_PROVIDER_SITE_OTHER): Payer: 59 | Admitting: Internal Medicine

## 2014-04-13 DIAGNOSIS — E039 Hypothyroidism, unspecified: Secondary | ICD-10-CM

## 2014-04-14 LAB — TSH: TSH: 0.048 u[IU]/mL — AB (ref 0.350–4.500)

## 2014-04-16 ENCOUNTER — Other Ambulatory Visit: Payer: Self-pay | Admitting: Gastroenterology

## 2014-04-16 ENCOUNTER — Other Ambulatory Visit: Payer: Self-pay

## 2014-04-16 MED ORDER — LEVOTHYROXINE SODIUM 100 MCG PO TABS: 100.0000 ug | ORAL_TABLET | Freq: Every day | ORAL | Status: DC
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
## 2014-07-02 ENCOUNTER — Encounter (HOSPITAL_COMMUNITY): Payer: Self-pay | Admitting: Pharmacy Technician

## 2014-07-03 ENCOUNTER — Encounter: Payer: Self-pay | Admitting: Nurse Practitioner

## 2014-07-03 ENCOUNTER — Ambulatory Visit (INDEPENDENT_AMBULATORY_CARE_PROVIDER_SITE_OTHER): Payer: 59 | Admitting: Nurse Practitioner

## 2014-07-03 VITALS — BP 110/70 | HR 68 | Resp 20 | Ht 61.75 in | Wt 136.6 lb

## 2014-07-03 DIAGNOSIS — K219 Gastro-esophageal reflux disease without esophagitis: Secondary | ICD-10-CM

## 2014-07-03 DIAGNOSIS — N951 Menopausal and female climacteric states: Secondary | ICD-10-CM

## 2014-07-03 DIAGNOSIS — E039 Hypothyroidism, unspecified: Secondary | ICD-10-CM

## 2014-07-03 DIAGNOSIS — E559 Vitamin D deficiency, unspecified: Secondary | ICD-10-CM

## 2014-07-03 DIAGNOSIS — Z01419 Encounter for gynecological examination (general) (routine) without abnormal findings: Secondary | ICD-10-CM

## 2014-07-03 NOTE — Progress Notes (Signed)
53 y.o. [id number] Married Caucasian Fe here for annual exam.  No new diagnosis.  Amenorrhea almost at a year.  Some warm flushes that are tolerable. No vaginal dryness.  Patient's last menstrual period was 07/22/2013.          Sexually active: Yes.    The current method of family planning is none.    Exercising: No.  not regularly Smoker:  Former smoker  Health Maintenance: Pap:  06/03/12 WNL/negative HR HPV MMG:  11/02/13 3D-normal-per patient will need request for 3D next year, so insurance will cover Colonoscopy:  One is scheduled for 10/15 BMD:   none TDaP:  3/13 with Dr [facility]: PCP-Dr Baxley   reports that she quit smoking about 28 years ago. Her smoking use included Cigarettes. She has a .4 pack-year smoking history. She has never used smokeless tobacco. She reports that she drinks alcohol. She reports that she does not use illicit drugs.  Past Medical History  Diagnosis Date  . GERD (gastroesophageal reflux disease)   . Hyperlipidemia   . Thyroid disease   . Vitamin D deficiency     History reviewed. No pertinent past surgical history.  Current Outpatient Prescriptions  Medication Sig Dispense Refill  . Cholecalciferol (VITAMIN D PO) Take by mouth.      Marland Kitchen. ibuprofen (ADVIL,MOTRIN) 200 MG tablet Take 200 mg by mouth once as needed for headache or mild pain.      Marland Kitchen. levothyroxine (SYNTHROID, LEVOTHROID) 100 MCG tablet Take 100 mcg by mouth daily before breakfast.      . Loratadine (ALAVERT PO) Take 1 tablet by mouth once as needed (allergies).      . meloxicam (MOBIC) 15 MG tablet Take 15 mg by mouth once as needed for pain.       . pantoprazole (PROTONIX) 40 MG tablet Take 40 mg by mouth every morning.      . simvastatin (ZOCOR) 20 MG tablet Take 20 mg by mouth every evening.       No current facility-administered medications for this visit.    Family History  Problem Relation Age of Onset  . Heart disease Father     ROS:  Pertinent items are noted in HPI.   Otherwise, a comprehensive ROS was negative.  Exam:   BP 110/70  Pulse 68  Resp 20  Ht 5' 1.75" (1.568 m)  Wt 136 lb 9.6 oz (61.961 kg)  BMI 25.20 kg/m2  LMP 07/22/2013 Height: 5' 1.75" (156.8 cm)  Ht Readings from Last 3 Encounters:  07/03/14 5' 1.75" (1.568 m)  02/01/14 5\' 1"  (1.549 m)  09/07/13 5' 1.25" (1.556 m)    General appearance: alert, cooperative and appears stated age Head: Normocephalic, without obvious abnormality, atraumatic Neck: no adenopathy, supple, symmetrical, trachea midline and thyroid normal to inspection and palpation Lungs: clear to auscultation bilaterally Breasts: normal appearance, no masses or tenderness Heart: regular rate and rhythm Abdomen: soft, non-tender; no masses,  no organomegaly Extremities: extremities normal, atraumatic, no cyanosis or edema Skin: Skin color, texture, turgor normal. No rashes or lesions Lymph nodes: Cervical, supraclavicular, and axillary nodes normal. No abnormal inguinal nodes palpated Neurologic: Grossly normal   Pelvic: External genitalia:  no lesions              Urethra:  normal appearing urethra with no masses, tenderness or lesions              Bartholin's and Skene's: normal  Vagina: normal appearing vagina with normal color and discharge, no lesions              Cervix: anteverted              Pap taken: No. Bimanual Exam:  Uterus:  normal size, contour, position, consistency, mobility, non-tender              Adnexa: no mass, fullness, tenderness               Rectovaginal: Confirms               Anus:  normal sphincter tone, no lesions  A:  Well Woman with normal exam  Postmenopausal   Tolerable vaso symptoms  Hypothyroid, Vit D deficiency, GERD   P:   Reviewed health and wellness pertinent to exam  Pap smear taken today  Mammogram is due 1/16  If any vaginal bleeding to call  Counseled on breast self exam, mammography screening, adequate intake of calcium and vitamin D, diet and  exercise, Kegel's exercises return annually or prn  An After Visit Summary was printed and given to the patient.

## 2014-07-03 NOTE — Progress Notes (Signed)
Encounter reviewed by Dr. Brook Silva.  

## 2014-07-03 NOTE — Patient Instructions (Signed)

## 2014-07-09 ENCOUNTER — Encounter (HOSPITAL_COMMUNITY): Payer: Self-pay | Admitting: *Deleted

## 2014-07-11 ENCOUNTER — Other Ambulatory Visit: Payer: Self-pay | Admitting: Internal Medicine

## 2014-07-11 DIAGNOSIS — E039 Hypothyroidism, unspecified: Secondary | ICD-10-CM

## 2014-07-11 DIAGNOSIS — E785 Hyperlipidemia, unspecified: Secondary | ICD-10-CM

## 2014-07-11 NOTE — Progress Notes (Signed)
Patient called requesting lab orders be faxed to her so she can take them to [facility] to have blood drawn.  Orders faxed.

## 2014-07-17 LAB — HEPATIC FUNCTION PANEL
ALK PHOS: 51 U/L (ref 39–117)
ALT: 13 U/L (ref 0–35)
AST: 18 U/L (ref 0–37)
Albumin: 4.2 g/dL (ref 3.5–5.2)
BILIRUBIN INDIRECT: 0.6 mg/dL (ref 0.2–1.2)
BILIRUBIN TOTAL: 0.7 mg/dL (ref 0.2–1.2)
Bilirubin, Direct: 0.1 mg/dL (ref 0.0–0.3)
TOTAL PROTEIN: 6.5 g/dL (ref 6.0–8.3)

## 2014-07-17 LAB — TSH: TSH: 0.081 u[IU]/mL — ABNORMAL LOW (ref 0.350–4.500)

## 2014-07-17 LAB — LIPID PANEL
Cholesterol: 197 mg/dL (ref 0–200)
HDL: 79 mg/dL (ref >39)
LDL CALC: 105 mg/dL — AB (ref 0–99)
Total CHOL/HDL Ratio: 2.5 Ratio
Triglycerides: 64 mg/dL (ref <150)
VLDL: 13 mg/dL (ref 0–40)

## 2014-07-20 ENCOUNTER — Ambulatory Visit (INDEPENDENT_AMBULATORY_CARE_PROVIDER_SITE_OTHER): Payer: 59 | Admitting: Internal Medicine

## 2014-07-20 ENCOUNTER — Encounter: Payer: Self-pay | Admitting: Internal Medicine

## 2014-07-20 VITALS — BP 120/80 | HR 75 | Temp 97.0°F | Ht 62.0 in | Wt 137.0 lb

## 2014-07-20 DIAGNOSIS — E039 Hypothyroidism, unspecified: Secondary | ICD-10-CM

## 2014-07-20 DIAGNOSIS — E785 Hyperlipidemia, unspecified: Secondary | ICD-10-CM

## 2014-07-20 DIAGNOSIS — K219 Gastro-esophageal reflux disease without esophagitis: Secondary | ICD-10-CM

## 2014-07-20 MED ORDER — LEVOTHYROXINE SODIUM 88 MCG PO TABS: 88.0000 ug | ORAL_TABLET | Freq: Every day | ORAL | Status: DC

## 2014-07-20 NOTE — Patient Instructions (Addendum)
Return in 8 weeks for TSH on new dose of Levothyroxine. Physical exam due April 2016.

## 2014-07-21 ENCOUNTER — Other Ambulatory Visit: Payer: Self-pay | Admitting: Internal Medicine

## 2014-07-23 ENCOUNTER — Other Ambulatory Visit: Payer: Self-pay | Admitting: Gastroenterology

## 2014-07-23 NOTE — Anesthesia Preprocedure Evaluation (Addendum)
Anesthesia Evaluation  Patient identified by MRN, date of birth, ID band Patient awake    Reviewed: Allergy & Precautions, H&P , NPO status , Patient's Chart, lab work & pertinent test results  Airway Mallampati: II TM Distance: >3 FB Neck ROM: Full    Dental no notable dental hx.    Pulmonary neg pulmonary ROS, former smoker,  breath sounds clear to auscultation  Pulmonary exam normal       Cardiovascular Exercise Tolerance: Good negative cardio ROS  Rhythm:Regular Rate:Normal     Neuro/Psych negative neurological ROS  negative psych ROS   GI/Hepatic negative GI ROS, Neg liver ROS, GERD-  Medicated and Controlled,  Endo/Other  negative endocrine ROSHypothyroidism   Renal/GU negative Renal ROS  negative genitourinary   Musculoskeletal negative musculoskeletal ROS (+)   Abdominal   Peds negative pediatric ROS (+)  Hematology negative hematology ROS (+)   Anesthesia Other Findings   Reproductive/Obstetrics negative OB ROS                        Anesthesia Physical Anesthesia Plan  ASA: II  Anesthesia Plan: MAC   Post-op Pain Management:    Induction: Intravenous  Airway Management Planned: Nasal Cannula  Additional Equipment:   Intra-op Plan:   Post-operative Plan: Extubation in OR  Informed Consent: I have reviewed the patients History and Physical, chart, labs and discussed the procedure including the risks, benefits and alternatives for the proposed anesthesia with the patient or authorized representative who has indicated his/her understanding and acceptance.   Dental advisory given  Plan Discussed with: CRNA  Anesthesia Plan Comments:         Anesthesia Quick Evaluation

## 2014-07-24 ENCOUNTER — Ambulatory Visit (HOSPITAL_COMMUNITY): Payer: 59 | Admitting: Anesthesiology

## 2014-07-24 ENCOUNTER — Ambulatory Visit (HOSPITAL_COMMUNITY)
Admission: RE | Admit: 2014-07-24 | Discharge: 2014-07-24 | Disposition: A | Discharge status: Home / Self Care | Payer: 59 | Source: Ambulatory Visit | Attending: Gastroenterology | Admitting: Gastroenterology

## 2014-07-24 ENCOUNTER — Encounter (HOSPITAL_COMMUNITY)
Admission: RE | Disposition: A | Discharge status: Home / Self Care | Payer: Self-pay | Source: Ambulatory Visit | Attending: Gastroenterology

## 2014-07-24 ENCOUNTER — Encounter (HOSPITAL_COMMUNITY): Payer: Self-pay | Admitting: *Deleted

## 2014-07-24 ENCOUNTER — Encounter (HOSPITAL_COMMUNITY): Payer: 59 | Admitting: Anesthesiology

## 2014-07-24 DIAGNOSIS — Z87891 Personal history of nicotine dependence: Secondary | ICD-10-CM | POA: Insufficient documentation

## 2014-07-24 DIAGNOSIS — Z1211 Encounter for screening for malignant neoplasm of colon: Secondary | ICD-10-CM | POA: Insufficient documentation

## 2014-07-24 DIAGNOSIS — E039 Hypothyroidism, unspecified: Secondary | ICD-10-CM | POA: Diagnosis not present

## 2014-07-24 HISTORY — PX: COLONOSCOPY WITH PROPOFOL: SHX5780

## 2014-07-24 SURGERY — COLONOSCOPY WITH PROPOFOL
Anesthesia: Monitor Anesthesia Care

## 2014-07-24 MED ORDER — PROPOFOL 10 MG/ML IV BOLUS
INTRAVENOUS | Status: AC
  Filled 2014-07-24: qty 20

## 2014-07-24 MED ORDER — LACTATED RINGERS IV SOLN
INTRAVENOUS | Status: DC | PRN
  Administered 2014-07-24: 08:00:00 via INTRAVENOUS

## 2014-07-24 MED ORDER — SODIUM CHLORIDE 0.9 % IJ SOLN
INTRAMUSCULAR | Status: AC
  Filled 2014-07-24: qty 10

## 2014-07-24 MED ORDER — LACTATED RINGERS IV SOLN
INTRAVENOUS | Status: DC
  Administered 2014-07-24: 1000 mL via INTRAVENOUS

## 2014-07-24 MED ORDER — PROPOFOL INFUSION 10 MG/ML OPTIME
INTRAVENOUS | Status: DC | PRN
  Administered 2014-07-24: 100 ug/kg/min via INTRAVENOUS

## 2014-07-24 MED ORDER — PROPOFOL 10 MG/ML IV EMUL
INTRAVENOUS | Status: DC | PRN
  Administered 2014-07-24 (×4): 20 mg via INTRAVENOUS
  Administered 2014-07-24: 40 mg via INTRAVENOUS

## 2014-07-24 MED ORDER — EPHEDRINE SULFATE 50 MG/ML IJ SOLN
INTRAMUSCULAR | Status: AC
  Filled 2014-07-24: qty 1

## 2014-07-24 MED ORDER — LIDOCAINE HCL (CARDIAC) 20 MG/ML IV SOLN
INTRAVENOUS | Status: AC
  Filled 2014-07-24: qty 5

## 2014-07-24 SURGICAL SUPPLY — 22 items

## 2014-07-24 NOTE — Transfer of Care (Signed)
Immediate Anesthesia Transfer of Care Note  Patient: Christie BachJulie D Streett  Procedure(s) Performed: Procedure(s): COLONOSCOPY WITH PROPOFOL (N/A)  Patient Location: PACU  Anesthesia Type:MAC  Level of Consciousness: awake, alert  and oriented  Airway & Oxygen Therapy: Patient Spontanous Breathing and Patient connected to face mask oxygen  Post-op Assessment: Report given to PACU RN and Post -op Vital signs reviewed and stable  Post vital signs: Reviewed and stable  Complications: No apparent anesthesia complications

## 2014-07-24 NOTE — H&P (Signed)
  Procedure: Baseline screening colonoscopy History: The patient is a 53 year old female born October 15, 1960. She is scheduled to undergo her first screening colonoscopy with polypectomy to prevent colon cancer. Medication allergies: None Past medical and surgical history: No chronic medical problems Exam: The patient is alert lying comfortably on the endoscopy stretcher. Lungs are clear to auscultation. Cardiac exam reveals a regular rhythm. Abdomen is soft and nontender to palpation. Plan: Proceed with baseline screening colonoscopy

## 2014-07-24 NOTE — Op Note (Signed)
Procedure: Baseline screening colonoscopy  Endoscopist: Danise EdgeMartin Johnson  Sedation: Propofol administered by anesthesia  Procedure: The patient was placed in the left lateral decubitus position. Anal inspection and digital rectal exam were normal. The Pentax pediatric colonoscope was introduced into the rectum and advanced to the cecum. A normal-appearing appendiceal orifice was identified. A normal-appearing ileocecal valve was intubated and the terminal ileum inspected. Colonic preparation for the exam today was good.  Rectum. Normal. Retroflexed view of the distal rectum normal  Sigmoid colon and descending colon. Normal  Splenic flexure. Normal  Transverse colon. Normal  Hepatic flexure. Normal  Ascending colon. Normal  Cecum and ileocecal valve. Normal  Terminal ileum. Normal  Assessment: Normal colonoscopy with inspection of the terminal ileum  Recommendation: Schedule repeat screening colonoscopy in 10 years

## 2014-07-24 NOTE — Discharge Instructions (Signed)
Colonoscopy, Care After °These instructions give you information on caring for yourself after your procedure. Your doctor may also give you more specific instructions. Call your doctor if you have any problems or questions after your procedure. °HOME CARE °· Do not drive for 24 hours. °· Do not sign important papers or use machinery for 24 hours. °· You may shower. °· You may go back to your usual activities, but go slower for the first 24 hours. °· Take rest breaks often during the first 24 hours. °· Walk around or use warm packs on your belly (abdomen) if you have belly cramping or gas. °· Drink enough fluids to keep your pee (urine) clear or pale yellow. °· Resume your normal diet. Avoid heavy or fried foods. °· Avoid drinking alcohol for 24 hours or as told by your doctor. °· Only take medicines as told by your doctor. °If a tissue sample (biopsy) was taken during the procedure:  °· Do not take aspirin or blood thinners for 7 days, or as told by your doctor. °· Do not drink alcohol for 7 days, or as told by your doctor. °· Eat soft foods for the first 24 hours. °GET HELP IF: °You still have a small amount of blood in your poop (stool) 2-3 days after the procedure. °GET HELP RIGHT AWAY IF: °· You have more than a small amount of blood in your poop. °· You see clumps of tissue (blood clots) in your poop. °· Your belly is puffy (swollen). °· You feel sick to your stomach (nauseous) or throw up (vomit). °· You have a fever. °· You have belly pain that gets worse and medicine does not help. °MAKE SURE YOU: °· Understand these instructions. °· Will watch your condition. °· Will get help right away if you are not doing well or get worse. °Document Released: 10/24/2010 Document Revised: 09/26/2013 Document Reviewed: 05/29/2013 °ExitCare® Patient Information ©2015 [employer]. This information is not intended to replace advice given to you by your health care provider. Make sure you discuss any questions you have with  your health care provider. ° °Monitored Anesthesia Care °Monitored anesthesia care is an anesthesia service for a medical procedure. Anesthesia is the loss of the ability to feel pain. It is produced by medicines called anesthetics. It may affect a small area of your body (local anesthesia), a large area of your body (regional anesthesia), or your entire body (general anesthesia). The need for monitored anesthesia care depends your procedure, your condition, and the potential need for regional or general anesthesia. It is often provided during procedures where:  °· General anesthesia may be needed if there are complications. This is because you need special care when you are under general anesthesia.   °· You will be under local or regional anesthesia. This is so that you are able to have higher levels of anesthesia if needed.   °· You will receive calming medicines (sedatives). This is especially the case if sedatives are given to put you in a semi-conscious state of relaxation (deep sedation). This is because the amount of sedative needed to produce this state can be hard to predict. Too much of a sedative can produce general anesthesia. °Monitored anesthesia care is performed by one or more health care providers who have special training in all types of anesthesia. You will need to meet with these health care providers before your procedure. During this meeting, they will ask you about your medical history. They will also give you instructions to follow. (  For example, you will need to stop eating and drinking before your procedure. You may also need to stop or change medicines you are taking.) During your procedure, your health care providers will stay with you. They will:  °· Watch your condition. This includes watching your blood pressure, breathing, and level of pain.   °· Diagnose and treat problems that occur.   °· Give medicines if they are needed. These may include calming medicines (sedatives) and  anesthetics.   °· Make sure you are comfortable.   °Having monitored anesthesia care does not necessarily mean that you will be under anesthesia. It does mean that your health care providers will be able to manage anesthesia if you need it or if it occurs. It also means that you will be able to have a different type of anesthesia than you are having if you need it. When your procedure is complete, your health care providers will continue to watch your condition. They will make sure any medicines wear off before you are allowed to go home.  °Document Released: 06/17/2005 Document Revised: 02/05/2014 Document Reviewed: 11/02/2012 °ExitCare® Patient Information ©2015 [employer]. This information is not intended to replace advice given to you by your health care provider. Make sure you discuss any questions you have with your health care provider. ° ° °

## 2014-07-25 ENCOUNTER — Encounter (HOSPITAL_COMMUNITY): Payer: Self-pay | Admitting: Gastroenterology

## 2014-07-26 NOTE — Anesthesia Postprocedure Evaluation (Signed)
  Anesthesia Post-op Note  Patient: Lawerance BachJulie D Kakar  Procedure(s) Performed: Procedure(s) (LRB): COLONOSCOPY WITH PROPOFOL (N/A)  Patient Location: PACU  Anesthesia Type: MAC  Level of Consciousness: awake and alert   Airway and Oxygen Therapy: Patient Spontanous Breathing  Post-op Pain: mild  Post-op Assessment: Post-op Vital signs reviewed, Patient's Cardiovascular Status Stable, Respiratory Function Stable, Patent Airway and No signs of Nausea or vomiting  Last Vitals:  Filed Vitals:   07/24/14 0850  BP: 122/76  Pulse: 65  Temp:   Resp: 15    Post-op Vital Signs: stable   Complications: No apparent anesthesia complications

## 2014-08-03 ENCOUNTER — Ambulatory Visit: Payer: 59 | Admitting: Internal Medicine

## 2014-08-06 ENCOUNTER — Encounter (HOSPITAL_COMMUNITY): Payer: Self-pay | Admitting: Gastroenterology

## 2014-09-02 NOTE — Progress Notes (Signed)
   Subjective:    Patient ID: Christie BachJulie D Balash, female    DOB: [birth year], 53 y.o.   MRN: [id number]  HPI  Six-month recheck on hypothyroidism and GE reflux. No complaints or problems. Doing well. Has colonoscopy planned in the near future. TSH is low. Has been low for the past several months. Were going to change Synthroid to 0.088 mg daily from 0.1 mg daily. Total cholesterol has improved on statin therapy from 231-197 and LDL cholesterol is improved from 132-105. Am pleased with this result.    Review of Systems     Objective:   Physical Exam  Neck is supple without thyromegaly. Chest clear. Cardiac exam regular rate and rhythm. Extremities without edema      Assessment & Plan:  Hypothyroidism-TSH is low indicating over replacement. Change dose to 0.088 mg daily  Hyperlipidemia-stable on statin therapy and improved  GE reflux-on PPI  Plan: Return in April for physical examination. Return in 8 weeks for TSH only on new dose of Synthroid.  25 minutes spent with patient

## 2014-09-13 ENCOUNTER — Other Ambulatory Visit: Payer: 59 | Admitting: Internal Medicine

## 2014-09-17 ENCOUNTER — Other Ambulatory Visit (INDEPENDENT_AMBULATORY_CARE_PROVIDER_SITE_OTHER): Payer: 59 | Admitting: Internal Medicine

## 2014-09-17 DIAGNOSIS — E039 Hypothyroidism, unspecified: Secondary | ICD-10-CM

## 2014-09-18 ENCOUNTER — Telehealth: Payer: Self-pay

## 2014-09-18 LAB — TSH: TSH: 0.466 u[IU]/mL (ref 0.350–4.500)

## 2014-09-18 NOTE — Telephone Encounter (Signed)
Patient informed of TSH results and advised to continue same therapy.

## 2014-09-18 NOTE — Telephone Encounter (Signed)
-----   Message from Margaree MackintoshMary J Baxley, MD sent at 09/18/2014  8:57 AM EST ----- TSH much improved. Continue same dose of thyroid replacement.

## 2014-10-16 ENCOUNTER — Other Ambulatory Visit: Payer: Self-pay | Admitting: Internal Medicine

## 2014-10-18 ENCOUNTER — Other Ambulatory Visit: Payer: Self-pay | Admitting: Internal Medicine

## 2014-10-23 ENCOUNTER — Telehealth: Payer: Self-pay | Admitting: Nurse Practitioner

## 2014-10-23 NOTE — Telephone Encounter (Signed)
Spoke with Programmer, applicationsAnne at [facility]. Initiated PA for patient's 3D mammogram. Reference number [id number]. Thurston Holenne states that no further forms need to be sent to [facility] as information was already provided. To check on status will need to return call in one week.

## 2014-10-23 NOTE — Telephone Encounter (Signed)
Spoke with patient. Patient states that her insurance will not cover a 3D mammogram unless she receives PA from our office. States PA needs to be sent to [facility] [address] [phone] [place] [phone]. Advised will speak with insurance company regarding PA and will let patient know once this has been sent off. Patient is agreeable.

## 2014-10-23 NOTE — Telephone Encounter (Signed)
Patient is asking for a referral for [facility] MMG to [facility]. Last seen 07/03/14.

## 2014-10-24 NOTE — Telephone Encounter (Signed)
Sp[facility] with Tiffany at [facility] who states that Christie Molina is "Not a participating provider and coverage has been denied for 3D screening mammogram." Nothing further can be done with PA to try to get approval per Tiffany. States patient will need to change location for coverage.

## 2014-10-24 NOTE — Telephone Encounter (Signed)
Spoke with [facility] who state that they accept all insurance coverage. Provided me with NPI [id number] and Tax ID [id number] to help with approval. Spoke with Marcelle SmilingNatasha at [facility] who states that [facility] is in network. States that CPT code [id number] is an invalid code and a I need to check with [facility] for new code. Advised will check with [facility] again regarding coding and return call to initiate another PA for patient.

## 2014-10-24 NOTE — Telephone Encounter (Signed)
Spoke with [facility]Bryan at [facility] who states that prior authorization is not needed for 3D screening mammogram for this patient. Advised would let patient know so that she can proceed with scheduling. Spoke with Sherri at [facility] who states only insurance requiring PA at this time is [employer]. Left message for patient to call Kaitlyn at [phone].

## 2014-10-24 NOTE — Telephone Encounter (Signed)
Spoke with patient. Advised [facility] no prior authorization is needed for 3D screening mammogram. Advised spoke with [facility] who states [facility]. Patient will call [facility] to schedule and return call if she needs anything further.  Routing to provider for final review. Patient agreeable to disposition. Will close encounter

## 2014-11-29 ENCOUNTER — Telehealth: Payer: Self-pay

## 2014-11-29 NOTE — Telephone Encounter (Signed)
Crystal from [facility] calling in regards to patient's PA for 3D mammogram with [facility]. Please see telephone call from 10/23/14.Crystal states that PA will need to be resent with [facility] NPI for approval. Advised will reinitiate PA for 3D screening mammogram with NPI for [facility].Spoke with Chole at [facility] regarding new PA for 3D screening mammogram who states that this is not required as [facility] is under patient's covered providers as well as her mammogram. Provided NPI for [facility] and Chole states that patient is covered for 3D mammogram and nothing further is needed at this time.  Routing to provider for final review. Patient agreeable to disposition. Will close encounter

## 2015-01-16 ENCOUNTER — Other Ambulatory Visit: Payer: Self-pay | Admitting: Internal Medicine

## 2015-01-17 ENCOUNTER — Other Ambulatory Visit: Payer: Self-pay | Admitting: *Deleted

## 2015-01-17 MED ORDER — SYNTHROID 88 MCG PO TABS: 88.0000 ug | ORAL_TABLET | Freq: Every day | ORAL | Status: DC

## 2015-01-17 NOTE — Telephone Encounter (Signed)
Sent refill on synthroid . Patient needs office visit prior to anymore refills.

## 2015-04-15 ENCOUNTER — Encounter: Payer: Self-pay | Admitting: Internal Medicine

## 2015-04-15 ENCOUNTER — Ambulatory Visit (INDEPENDENT_AMBULATORY_CARE_PROVIDER_SITE_OTHER): Payer: Commercial Managed Care - HMO | Admitting: Internal Medicine

## 2015-04-15 VITALS — BP 128/74 | HR 74 | Temp 97.9°F | Wt 133.0 lb

## 2015-04-15 DIAGNOSIS — K529 Noninfective gastroenteritis and colitis, unspecified: Secondary | ICD-10-CM

## 2015-04-15 LAB — HEMOCCULT GUIAC POC 1CARD (OFFICE): Fecal Occult Blood, POC: NEGATIVE

## 2015-04-15 MED ORDER — CIPROFLOXACIN HCL 500 MG PO TABS: 500.0000 mg | ORAL_TABLET | Freq: Two times a day (BID) | ORAL | Status: DC

## 2015-04-15 NOTE — Addendum Note (Signed)
Addended by: Thomasena EdisWILLIAMS, Lilah Mijangos N on: 04/15/2015 04:19 PM   Modules accepted: Orders

## 2015-04-15 NOTE — Progress Notes (Signed)
   Subjective:    Patient ID: Christie Molina, female    DOB: 06/07/1961, 54 y.o.   MRN: [id number]  HPI  Patient says she developed diarrhea about 2 weeks ago starting on Friday. No one else at home got ill. Does not recall eating seafood and has not traveled out of town recently. She had no fever or shaking chills. Had for 5 episodes a day for about 3 days and then it improved. This past Friday, July 8, diarrhea started again. She had 45 episodes. She continued to eat regular diet. She had some cheese yesterday and a few hours later had an episode of diarrhea. No prior history of lactose intolerance. No blood in stool. Diarrhea does not awaken her from sleep. Has some discomfort in her lower abdomen with the diarrhea described as burning. No recent travel history.  She is concerned because [place]  History of GE reflux and hypothyroidism. She takes a PPI.  Review of Systems     Objective:   Physical Exam  Skin warm and dry. Nodes none. Abdomen is soft nondistended without hepatosplenomegaly masses or significant tenderness whatsoever. Bowel sounds are active. Stool is guaiac negative.      Assessment & Plan:  Diarrhea  Possibilities include bacterial gastroenteritis or viral gastroenteritis. Doubt parasites. Also could be irritable bowel syndrome or colitis.  Plan: Patient is to go on clear liquids for 48 hours and advance to soft and then regular diet over the next several days. Avoid milk and milk products. Cipro 500 mg twice daily for 3 days. If symptoms persist or recur, consider stool studies.

## 2015-04-15 NOTE — Patient Instructions (Signed)
CIPRO 500 mg twice daily for 3 days. Clear liquids and advance diet slowly. Call if not better in 10 days or sooner if worse.

## 2015-06-24 ENCOUNTER — Other Ambulatory Visit: Payer: Self-pay | Admitting: Internal Medicine

## 2015-07-05 ENCOUNTER — Ambulatory Visit: Payer: 59 | Admitting: Nurse Practitioner

## 2015-07-10 ENCOUNTER — Ambulatory Visit: Payer: Self-pay | Admitting: Nurse Practitioner

## 2015-08-16 ENCOUNTER — Encounter: Payer: Self-pay | Admitting: Nurse Practitioner

## 2015-08-16 ENCOUNTER — Ambulatory Visit (INDEPENDENT_AMBULATORY_CARE_PROVIDER_SITE_OTHER): Payer: Commercial Managed Care - HMO | Admitting: Nurse Practitioner

## 2015-08-16 VITALS — BP 122/76 | HR 68 | Ht 61.5 in | Wt 134.0 lb

## 2015-08-16 DIAGNOSIS — Z0000 Encounter for general adult medical examination without abnormal findings: Secondary | ICD-10-CM | POA: Diagnosis not present

## 2015-08-16 DIAGNOSIS — Z01419 Encounter for gynecological examination (general) (routine) without abnormal findings: Secondary | ICD-10-CM | POA: Diagnosis not present

## 2015-08-16 DIAGNOSIS — N952 Postmenopausal atrophic vaginitis: Secondary | ICD-10-CM | POA: Diagnosis not present

## 2015-08-16 LAB — POCT URINALYSIS DIPSTICK
Bilirubin, UA: NEGATIVE
Blood, UA: NEGATIVE
Glucose, UA: NEGATIVE
Ketones, UA: NEGATIVE
Leukocytes, UA: NEGATIVE
Nitrite, UA: NEGATIVE
PH UA: 5
PROTEIN UA: NEGATIVE
Urobilinogen, UA: NEGATIVE

## 2015-08-16 NOTE — Progress Notes (Signed)
Patient ID: Christie BachJulie D Men, female   DOB: [birth year], 54 y.o.   MRN: [id number] 54 y.o. [id number] Married  Caucasian Fe here for annual exam.  No new medical problems.  Rare vaso symptoms, no complaints of vaginal dryness.  Did have colonoscopy and was normal 07/2014.  Patient's last menstrual period was 07/22/2013 (exact date).          Sexually active: Yes.    The current method of family planning is post menopausal status.    Exercising: No.  The patient does not participate in regular exercise at present. Smoker:  no  Health Maintenance: Pap: 06/03/12, Negative with neg HR HPV MMG:04/30/15, 3D, Bi-Rads 2: benign findings Colonoscopy: 07/24/14, Normal, repeat in 10 years, Dr. Daphine DeutscherMartin TDaP: 3/13 with Dr Lenord FellersBaxley Labs: PCP-Dr Baxley  Urine: Negative    reports that she quit smoking about 29 years ago. Her smoking use included Cigarettes. She has a .4 pack-year smoking history. She has never used smokeless tobacco. She reports that she drinks alcohol. She reports that she does not use illicit drugs.  Past Medical History  Diagnosis Date  . GERD (gastroesophageal reflux disease)   . Hyperlipidemia   . Thyroid disease   . Vitamin D deficiency   . Family history of anesthesia complication     mother n/v    Past Surgical History  Procedure Laterality Date  . No past surgeries    . Colonoscopy with propofol N/A 07/24/2014    Procedure: COLONOSCOPY WITH PROPOFOL;  Surgeon: Charolett BumpersMartin K Johnson, MD;  Location: [facility];  Service: Endoscopy;  Laterality: N/A;    Current Outpatient Prescriptions  Medication Sig Dispense Refill  . Cholecalciferol (VITAMIN D PO) Take by mouth.    Marland Kitchen. ibuprofen (ADVIL,MOTRIN) 200 MG tablet Take 200 mg by mouth once as needed for headache or mild pain.    . Loratadine (ALAVERT PO) Take 1 tablet by mouth once as needed (allergies).    . pantoprazole (PROTONIX) 40 MG tablet TAKE 1 TABLET BY MOUTH DAILY 90 tablet 3  . simvastatin (ZOCOR) 20 MG tablet Take 20 mg by  mouth every evening.    Marland Kitchen. SYNTHROID 88 MCG tablet TAKE 1 TABLET BY MOUTH EVERY DAY 90 tablet 0   No current facility-administered medications for this visit.    Family History  Problem Relation Age of Onset  . Heart disease Father     ROS:  Pertinent items are noted in HPI.  Otherwise, a comprehensive ROS was negative.  Exam:   BP 122/76 mmHg  Pulse 68  Ht 5' 1.5" (1.562 m)  Wt 134 lb (60.782 kg)  BMI 24.91 kg/m2  LMP 07/22/2013 (Exact Date) Height: 5' 1.5" (156.2 cm) Ht Readings from Last 3 Encounters:  08/16/15 5' 1.5" (1.562 m)  07/20/14 5\' 2"  (1.575 m)  07/03/14 5' 1.75" (1.568 m)    General appearance: alert, cooperative and appears stated age Head: Normocephalic, without obvious abnormality, atraumatic Neck: no adenopathy, supple, symmetrical, trachea midline and thyroid normal to inspection and palpation Lungs: clear to auscultation bilaterally Breasts: normal appearance, no masses or tenderness Heart: regular rate and rhythm Abdomen: soft, non-tender; no masses,  no organomegaly Extremities: extremities normal, atraumatic, no cyanosis or edema Skin: Skin color, texture, turgor normal. No rashes or lesions Lymph nodes: Cervical, supraclavicular, and axillary nodes normal. No abnormal inguinal nodes palpated Neurologic: Grossly normal   Pelvic: External genitalia:  no lesions              Urethra:  normal appearing  urethra with no masses, tenderness or lesions              Bartholin's and Skene's: normal                 Vagina: atrophic appearing vagina with normal color and discharge, no lesions              Cervix: anteverted              Pap taken: Yes.   Bimanual Exam:  Uterus:  normal size, contour, position, consistency, mobility, non-tender              Adnexa: no mass, fullness, tenderness               Rectovaginal: Confirms               Anus:  normal sphincter tone, no lesions  Chaperone present: yes  A:  Well Woman with normal  exam  Postmenopausal  Tolerable vaso symptoms Hypothyroid, Vit D deficiency, GERD, hypercholesterolemia  Atrophic vaginitis - declines hormonal treatment    P:   Reviewed health and wellness pertinent to exam  Pap smear as above  Mammogram is due 04/2016  Counseled on breast self exam, mammography screening, adequate intake of calcium and vitamin D, diet and exercise, Kegel's exercises return annually or prn  An After Visit Summary was printed and given to the patient.

## 2015-08-16 NOTE — Patient Instructions (Addendum)
EXERCISE AND DIET:  We recommended that you start or continue a regular exercise program for good health. Regular exercise means any activity that makes your heart beat faster and makes you sweat.  We recommend exercising at least 30 minutes per day at least 3 days a week, preferably 4 or 5.  We also recommend a diet low in fat and sugar.  Inactivity, poor dietary choices and obesity can cause diabetes, heart attack, stroke, and kidney damage, among others.    ALCOHOL AND SMOKING:  Women should limit their alcohol intake to no more than 7 drinks/beers/glasses of wine (combined, not each!) per week. Moderation of alcohol intake to this level decreases your risk of breast cancer and liver damage. And of course, no recreational drugs are part of a healthy lifestyle.  And absolutely no smoking or even second hand smoke. Most people know smoking can cause heart and lung diseases, but did you know it also contributes to weakening of your bones? Aging of your skin?  Yellowing of your teeth and nails?  CALCIUM AND VITAMIN D:  Adequate intake of calcium and Vitamin D are recommended.  The recommendations for exact amounts of these supplements seem to change often, but generally speaking 600 mg of calcium (either carbonate or citrate) and 800 units of Vitamin D per day seems prudent. Certain women may benefit from higher intake of Vitamin D.  If you are among these women, your doctor will have told you during your visit.    PAP SMEARS:  Pap smears, to check for cervical cancer or precancers,  have traditionally been done yearly, although recent scientific advances have shown that most women can have pap smears less often.  However, every woman still should have a physical exam from her gynecologist every year. It will include a breast check, inspection of the vulva and vagina to check for abnormal growths or skin changes, a visual exam of the cervix, and then an exam to evaluate the size and shape of the uterus and  ovaries.  And after 55 years of age, a rectal exam is indicated to check for rectal cancers. We will also provide age appropriate advice regarding health maintenance, like when you should have certain vaccines, screening for sexually transmitted diseases, bone density testing, colonoscopy, mammograms, etc.   MAMMOGRAMS:  All women over 40 years old should have a yearly mammogram. Many facilities now offer a "3D" mammogram, which may cost around $50 extra out of pocket. If possible,  we recommend you accept the option to have the 3D mammogram performed.  It both reduces the number of women who will be called back for extra views which then turn out to be normal, and it is better than the routine mammogram at detecting truly abnormal areas.    COLONOSCOPY:  Colonoscopy to screen for colon cancer is recommended for all women at age 50.  We know, you hate the idea of the prep.  We agree, BUT, having colon cancer and not knowing it is worse!!  Colon cancer so often starts as a polyp that can be seen and removed at colonscopy, which can quite literally save your life!  And if your first colonoscopy is normal and you have no family history of colon cancer, most women don't have to have it again for 10 years.  Once every ten years, you can do something that may end up saving your life, right?  We will be happy to help you get it scheduled when you are ready.    Be sure to check your insurance coverage so you understand how much it will cost.  It may be covered as a preventative service at no cost, but you should check your particular policy.     Atrophic Vaginitis Atrophic vaginitis is a condition in which the tissues that line the vagina become dry and thin. This condition is most common in women who have stopped having regular menstrual periods (menopause). This usually starts when a woman is 45-55 years old. Estrogen helps to keep the vagina moist. It stimulates the vagina to produce a clear fluid that lubricates  the vagina for sexual intercourse. This fluid also protects the vagina from infection. Lack of estrogen can cause the lining of the vagina to get thinner and dryer. The vagina may also shrink in size. It may become less elastic. Atrophic vaginitis tends to get worse over time as a woman's estrogen level drops. CAUSES This condition is caused by the normal drop in estrogen that happens around the time of menopause. RISK FACTORS Certain conditions or situations may lower a woman's estrogen level, which increases her risk of atrophic vaginitis. These include:  Taking medicine that blocks estrogen.  Having ovaries removed surgically.  Being treated for cancer with X-ray treatment (radiation) or medicines (chemotherapy).  Exercising very hard and often.  Having an eating disorder (anorexia).  Giving birth or breastfeeding.  Being over the age of 50.  Smoking. SYMPTOMS Symptoms of this condition include:  Pain, soreness, or bleeding during sexual intercourse (dyspareunia).  Vaginal burning, irritation, or itching.  Pain or bleeding during a vaginal examination using a speculum (pelvic exam).  Loss of interest in sexual activity.  Having burning pain when passing urine.  Vaginal discharge that is brown or yellow. In some cases, there are no symptoms. DIAGNOSIS This condition is diagnosed with a medical history and physical exam. This will include a pelvic exam that checks whether the inside of your vagina appears pale, thin, or dry. Rarely, you may also have other tests, including:  A urine test.  A test that checks the acid balance in your vaginal fluid (acid balance test). TREATMENT Treatment for this condition may depend on the severity of your symptoms. Treatment may include:  Using an over-the-counter vaginal lubricant before you have sexual intercourse.  Using a long-acting vaginal moisturizer.  Using low-dose vaginal estrogen for moderate to severe symptoms that do  not respond to other treatments. Options include creams, tablets, and inserts (vaginal rings). Before using vaginal estrogen, tell your health care provider if you have a history of:  Breast cancer.  Endometrial cancer.  Blood clots.  Taking medicines. You may be able to take a daily pill for dyspareunia. Discuss all of the risks of this medicine with your health care provider. It is usually not recommended for women who have a family history or personal history of breast cancer. If your symptoms are very mild and you are not sexually active, you may not need treatment. HOME CARE INSTRUCTIONS  Take medicines only as directed by your health care provider. Do not use herbal or alternative medicines unless your health care provider says that you can.  Use over-the-counter creams, lubricants, or moisturizers for dryness only as directed by your health care provider.  If your atrophic vaginitis is caused by menopause, discuss all of your menopausal symptoms and treatment options with your health care provider.  Do not douche.  Do not use products that can make your vagina dry. These include:  Scented feminine sprays.    Scented tampons.  Scented soaps.  If it hurts to have sex, talk with your sexual partner. SEEK MEDICAL CARE IF:  Your discharge looks different than normal.  Your vagina has an unusual smell.  You have new symptoms.  Your symptoms do not improve with treatment.  Your symptoms get worse.   This information is not intended to replace advice given to you by your health care provider. Make sure you discuss any questions you have with your health care provider.   Document Released: 02/05/2015 Document Reviewed: 02/05/2015 Elsevier Interactive Patient Education 2016 [employer]  

## 2015-08-19 NOTE — Progress Notes (Signed)
Encounter reviewed by Dr. Khizar Fiorella Amundson C. Silva.  

## 2015-08-21 LAB — IPS PAP TEST WITH HPV

## 2016-01-13 ENCOUNTER — Other Ambulatory Visit: Payer: Self-pay | Admitting: Internal Medicine

## 2016-01-13 NOTE — Telephone Encounter (Signed)
Needs PE without Pap. Not seen since 2015.

## 2016-01-20 ENCOUNTER — Other Ambulatory Visit: Payer: Self-pay | Admitting: Internal Medicine

## 2016-01-20 LAB — COMPREHENSIVE METABOLIC PANEL
ALT: 10 U/L (ref 6–29)
AST: 16 U/L (ref 10–35)
Albumin: 4 g/dL (ref 3.6–5.1)
Alkaline Phosphatase: 52 U/L (ref 33–130)
BILIRUBIN TOTAL: 0.6 mg/dL (ref 0.2–1.2)
BUN: 8 mg/dL (ref 7–25)
CO2: 26 mmol/L (ref 20–31)
Calcium: 9.3 mg/dL (ref 8.6–10.4)
Chloride: 104 mmol/L (ref 98–110)
Creat: 0.83 mg/dL (ref 0.50–1.05)
Glucose, Bld: 87 mg/dL (ref 65–99)
Potassium: 5 mmol/L (ref 3.5–5.3)
Sodium: 140 mmol/L (ref 135–146)
Total Protein: 6.4 g/dL (ref 6.1–8.1)

## 2016-01-20 LAB — TSH: TSH: 1.4 mIU/L

## 2016-01-20 LAB — LIPID PANEL
Cholesterol: 234 mg/dL — ABNORMAL HIGH (ref 125–200)
HDL: 83 mg/dL (ref >=46)
LDL Cholesterol: 138 mg/dL — ABNORMAL HIGH (ref <130)
Total CHOL/HDL Ratio: 2.8 Ratio (ref <=5.0)
Triglycerides: 67 mg/dL (ref <150)
VLDL: 13 mg/dL (ref <30)

## 2016-01-20 LAB — CBC
HCT: 38.7 % (ref 35.0–45.0)
HEMOGLOBIN: 13.2 g/dL (ref 11.7–15.5)
MCH: 29.7 pg (ref 27.0–33.0)
MCHC: 34.1 g/dL (ref 32.0–36.0)
MCV: 87.2 fL (ref 80.0–100.0)
MPV: 10.2 fL (ref 7.5–12.5)
PLATELETS: 206 10*3/uL (ref 140–400)
RBC: 4.44 MIL/uL (ref 3.80–5.10)
RDW: 13.6 % (ref 11.0–15.0)
WBC: 3.7 10*3/uL — ABNORMAL LOW (ref 3.8–10.8)

## 2016-01-21 LAB — VITAMIN D 25 HYDROXY (VIT D DEFICIENCY, FRACTURES): VIT D 25 HYDROXY: 20 ng/mL — AB (ref 30–100)

## 2016-01-23 ENCOUNTER — Encounter: Payer: Self-pay | Admitting: Internal Medicine

## 2016-01-23 ENCOUNTER — Ambulatory Visit (INDEPENDENT_AMBULATORY_CARE_PROVIDER_SITE_OTHER): Payer: Commercial Managed Care - HMO | Admitting: Internal Medicine

## 2016-01-23 VITALS — BP 120/78 | HR 75 | Temp 97.0°F | Resp 18 | Wt 136.0 lb

## 2016-01-23 DIAGNOSIS — E039 Hypothyroidism, unspecified: Secondary | ICD-10-CM | POA: Diagnosis not present

## 2016-01-23 DIAGNOSIS — Z0000 Encounter for general adult medical examination without abnormal findings: Secondary | ICD-10-CM | POA: Diagnosis not present

## 2016-01-23 DIAGNOSIS — E785 Hyperlipidemia, unspecified: Secondary | ICD-10-CM

## 2016-01-23 DIAGNOSIS — E559 Vitamin D deficiency, unspecified: Secondary | ICD-10-CM | POA: Diagnosis not present

## 2016-01-23 DIAGNOSIS — F411 Generalized anxiety disorder: Secondary | ICD-10-CM | POA: Diagnosis not present

## 2016-01-23 DIAGNOSIS — K219 Gastro-esophageal reflux disease without esophagitis: Secondary | ICD-10-CM | POA: Diagnosis not present

## 2016-01-23 LAB — POCT URINALYSIS DIPSTICK
Bilirubin, UA: NEGATIVE
Glucose, UA: NEGATIVE
Ketones, UA: NEGATIVE
Leukocytes, UA: NEGATIVE
NITRITE UA: NEGATIVE
PH UA: 6
PROTEIN UA: NEGATIVE
RBC UA: NEGATIVE
SPEC GRAV UA: 1.01
UROBILINOGEN UA: 0.2

## 2016-01-23 MED ORDER — SIMVASTATIN 20 MG PO TABS: 20.0000 mg | ORAL_TABLET | Freq: Every evening | ORAL | Status: DC

## 2016-01-23 NOTE — Patient Instructions (Addendum)
It was a pleasure to see you today. Continue same medications and return in one year. Take 2000 units vitamin D 3 daily. Please be compliant with that. Take lipid-lowering medication regularly.
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
## 2016-02-11 ENCOUNTER — Other Ambulatory Visit: Payer: Self-pay | Admitting: Internal Medicine

## 2016-05-07 ENCOUNTER — Encounter: Payer: Self-pay | Admitting: Nurse Practitioner

## 2016-05-26 ENCOUNTER — Other Ambulatory Visit: Payer: Self-pay | Admitting: Internal Medicine

## 2016-08-18 ENCOUNTER — Ambulatory Visit: Payer: Commercial Managed Care - HMO | Admitting: Nurse Practitioner

## 2016-09-22 ENCOUNTER — Encounter: Payer: Self-pay | Admitting: Nurse Practitioner

## 2016-09-22 ENCOUNTER — Ambulatory Visit (INDEPENDENT_AMBULATORY_CARE_PROVIDER_SITE_OTHER): Payer: Commercial Managed Care - HMO | Admitting: Nurse Practitioner

## 2016-09-22 VITALS — BP 110/66 | HR 72 | Ht 61.25 in | Wt 138.0 lb

## 2016-09-22 DIAGNOSIS — Z01419 Encounter for gynecological examination (general) (routine) without abnormal findings: Secondary | ICD-10-CM

## 2016-09-22 DIAGNOSIS — Z0000 Encounter for general adult medical examination without abnormal findings: Secondary | ICD-10-CM

## 2016-09-22 DIAGNOSIS — N952 Postmenopausal atrophic vaginitis: Secondary | ICD-10-CM | POA: Diagnosis not present

## 2016-09-22 NOTE — Patient Instructions (Signed)

## 2016-09-22 NOTE — Progress Notes (Signed)
Patient ID: Christie Molina, female   DOB: 1961-02-19, 55 y.o.   MRN: [id number]  55 y.o. [id number] Married  Caucasian Fe here for annual exam.  No new health problems.  Mother age 56 in nursing home.  Patient's last menstrual period was 07/22/2013 (exact date).          Sexually active: Yes.    The current method of family planning is post menopausal status.    Exercising: Yes.    walking Smoker:  no  Health Maintenance: Pap:08/16/15, Negative with neg HR HPV MMG: 04/27/16, 3D, Bi-Rads 1:  Negative Colonoscopy: 07/24/14, Normal, repeat in 10 years, Dr. Daphine DeutscherMartin TDaP:01/03/10 Shingles: Not indicated due to age Pneumonia: Not indicated due to age Hep C and HIV: discuss today and declines Labs: PCP in EPIC   reports that she quit smoking about 30 years ago. Her smoking use included Cigarettes. She has a 0.40 pack-year smoking history. She has never used smokeless tobacco. She reports that she drinks alcohol. She reports that she does not use drugs.  Past Medical History:  Diagnosis Date  . Family history of anesthesia complication    mother n/v  . GERD (gastroesophageal reflux disease)   . Hyperlipidemia   . Thyroid disease   . Vitamin D deficiency     Past Surgical History:  Procedure Laterality Date  . COLONOSCOPY WITH PROPOFOL N/A 07/24/2014   Procedure: COLONOSCOPY WITH PROPOFOL;  Surgeon: Charolett BumpersMartin K Johnson, MD;  Location: [facility];  Service: Endoscopy;  Laterality: N/A;  . NO PAST SURGERIES      Current Outpatient Prescriptions  Medication Sig Dispense Refill  . Cholecalciferol (VITAMIN D PO) Take by mouth.    Marland Kitchen. ibuprofen (ADVIL,MOTRIN) 200 MG tablet Take 200 mg by mouth once as needed for headache or mild pain.    . Loratadine (ALAVERT PO) Take 1 tablet by mouth once as needed (allergies).    . pantoprazole (PROTONIX) 40 MG tablet TAKE 1 TABLET BY MOUTH DAILY 90 tablet 3  . simvastatin (ZOCOR) 20 MG tablet Take 1 tablet (20 mg total) by mouth every evening. 90 tablet 3   . SYNTHROID 88 MCG tablet TAKE 1 TABLET BY MOUTH EVERY DAY 90 tablet 0  . SYNTHROID 88 MCG tablet TAKE 1 TABLET BY MOUTH EVERY DAY 90 tablet 2   No current facility-administered medications for this visit.     Family History  Problem Relation Age of Onset  . Heart disease Father     ROS:  Pertinent items are noted in HPI.  Otherwise, a comprehensive ROS was negative.  Exam:   LMP 07/22/2013 (Exact Date)    Ht Readings from Last 3 Encounters:  08/16/15 5' 1.5" (1.562 m)  07/20/14 5\' 2"  (1.575 m)  07/03/14 5' 1.75" (1.568 m)    General appearance: alert, cooperative and appears stated age Head: Normocephalic, without obvious abnormality, atraumatic Neck: no adenopathy, supple, symmetrical, trachea midline and thyroid normal to inspection and palpation Lungs: clear to auscultation bilaterally Breasts: normal appearance, no masses or tenderness Heart: regular rate and rhythm Abdomen: soft, non-tender; no masses,  no organomegaly Extremities: extremities normal, atraumatic, no cyanosis or edema Skin: Skin color, texture, turgor normal. No rashes or lesions Lymph nodes: Cervical, supraclavicular, and axillary nodes normal. No abnormal inguinal nodes palpated Neurologic: Grossly normal   Pelvic: External genitalia:  no lesions              Urethra:  normal appearing urethra with no masses, tenderness or lesions  Bartholin's and Skene's: normal                 Vagina: normal appearing vagina with normal color and discharge, no lesions              Cervix: anteverted              Pap taken: Yes.   Bimanual Exam:  Uterus:  normal size, contour, position, consistency, mobility, non-tender              Adnexa: no mass, fullness, tenderness               Rectovaginal: Confirms               Anus:  normal sphincter tone, no lesions  Chaperone present: yes  A:  Well Woman with normal exam  Postmenopausal  Tolerable vaso symptoms Hypothyroid,  Vit D deficiency, GERD, hypercholesterolemia             Atrophic vaginitis - declines hormonal treatment    P:   Reviewed health and wellness pertinent to exam  Pap smear as above  Mammogram is due 04/2017  [facility]  Counseled on breast self exam, mammography screening, adequate intake of calcium and vitamin D, diet and exercise, Kegel's exercises return annually or prn  An After Visit Summary was printed and given to the patient.

## 2016-09-23 LAB — IPS PAP TEST WITH REFLEX TO HPV

## 2016-09-29 NOTE — Progress Notes (Signed)
Encounter reviewed by Dr. Valarie Farace Amundson C. Silva.  

## 2016-10-19 DIAGNOSIS — M85852 Other specified disorders of bone density and structure, left thigh: Secondary | ICD-10-CM | POA: Diagnosis not present

## 2016-10-19 DIAGNOSIS — Z780 Asymptomatic menopausal state: Secondary | ICD-10-CM | POA: Diagnosis not present

## 2016-10-26 ENCOUNTER — Telehealth: Payer: Self-pay | Admitting: Nurse Practitioner

## 2016-10-26 NOTE — Telephone Encounter (Signed)
Please let pt know that BMD done on 10/19/16 shows a T Score at the spine of -0.70; right hip neck at -1.0; left hip neck at -1.30.  The lowest is the left hip and falls in the "low normal" range.  While some loss is normal and expectant in postmenopausal women we do not want to see further loss.  She must continue walking but increase to 4 times a week.  She must also do upper body weights.  Continue with Vit D 2000 IU per Dr Lenord FellersBaxley but this needs recheck this spring.  Lets repeat BMD at 2 yrs.

## 2016-10-26 NOTE — Telephone Encounter (Signed)
Patient notified of results and voices good understanding.  She will call if she has any questions, otherwise she will review with Shirlyn GoltzPatty Grubb, FNP at her next annual exam.  Closing encounter.

## 2016-11-09 ENCOUNTER — Other Ambulatory Visit: Payer: Self-pay | Admitting: Internal Medicine

## 2016-11-09 NOTE — Telephone Encounter (Signed)
Needs PE appt late April. Please call and book before refilling

## 2016-11-11 ENCOUNTER — Encounter: Payer: Self-pay | Admitting: Nurse Practitioner

## 2017-01-18 ENCOUNTER — Other Ambulatory Visit: Payer: Commercial Managed Care - HMO | Admitting: Internal Medicine

## 2017-01-18 DIAGNOSIS — Z0000 Encounter for general adult medical examination without abnormal findings: Secondary | ICD-10-CM

## 2017-01-18 DIAGNOSIS — E559 Vitamin D deficiency, unspecified: Secondary | ICD-10-CM

## 2017-01-18 DIAGNOSIS — E785 Hyperlipidemia, unspecified: Secondary | ICD-10-CM

## 2017-01-18 DIAGNOSIS — E039 Hypothyroidism, unspecified: Secondary | ICD-10-CM

## 2017-01-18 LAB — COMPREHENSIVE METABOLIC PANEL
ALBUMIN: 4 g/dL (ref 3.6–5.1)
ALK PHOS: 50 U/L (ref 33–130)
ALT: 13 U/L (ref 6–29)
AST: 20 U/L (ref 10–35)
BUN: 10 mg/dL (ref 7–25)
CALCIUM: 9.4 mg/dL (ref 8.6–10.4)
CO2: 26 mmol/L (ref 20–31)
Chloride: 105 mmol/L (ref 98–110)
Creat: 0.74 mg/dL (ref 0.50–1.05)
Glucose, Bld: 86 mg/dL (ref 65–99)
POTASSIUM: 4.6 mmol/L (ref 3.5–5.3)
Sodium: 141 mmol/L (ref 135–146)
TOTAL PROTEIN: 6.5 g/dL (ref 6.1–8.1)
Total Bilirubin: 0.5 mg/dL (ref 0.2–1.2)

## 2017-01-18 LAB — CBC WITH DIFFERENTIAL/PLATELET
BASOS ABS: 52 {cells}/uL (ref 0–200)
BASOS PCT: 1 %
Eosinophils Absolute: 52 cells/uL (ref 15–500)
Eosinophils Relative: 1 %
HEMATOCRIT: 40.1 % (ref 35.0–45.0)
Hemoglobin: 13.5 g/dL (ref 11.7–15.5)
Lymphocytes Relative: 25 %
Lymphs Abs: 1300 cells/uL (ref 850–3900)
MCH: 29.8 pg (ref 27.0–33.0)
MCHC: 33.7 g/dL (ref 32.0–36.0)
MCV: 88.5 fL (ref 80.0–100.0)
MPV: 9.8 fL (ref 7.5–12.5)
Monocytes Absolute: 520 cells/uL (ref 200–950)
Monocytes Relative: 10 %
Neutro Abs: 3276 cells/uL (ref 1500–7800)
Neutrophils Relative %: 63 %
Platelets: 208 10*3/uL (ref 140–400)
RBC: 4.53 MIL/uL (ref 3.80–5.10)
RDW: 13.7 % (ref 11.0–15.0)
WBC: 5.2 10*3/uL (ref 3.8–10.8)

## 2017-01-18 LAB — LIPID PANEL
CHOLESTEROL: 203 mg/dL — AB (ref <200)
HDL: 83 mg/dL (ref >50)
LDL Cholesterol: 107 mg/dL — ABNORMAL HIGH (ref <100)
TRIGLYCERIDES: 63 mg/dL (ref <150)
Total CHOL/HDL Ratio: 2.4 Ratio (ref <5.0)
VLDL: 13 mg/dL (ref <30)

## 2017-01-18 LAB — TSH: TSH: 0.35 mIU/L — ABNORMAL LOW

## 2017-01-19 LAB — VITAMIN D 25 HYDROXY (VIT D DEFICIENCY, FRACTURES): VIT D 25 HYDROXY: 30 ng/mL (ref 30–100)

## 2017-01-25 ENCOUNTER — Encounter: Payer: Self-pay | Admitting: Internal Medicine

## 2017-01-26 ENCOUNTER — Ambulatory Visit (INDEPENDENT_AMBULATORY_CARE_PROVIDER_SITE_OTHER): Payer: Commercial Managed Care - HMO | Admitting: Internal Medicine

## 2017-01-26 ENCOUNTER — Encounter: Payer: Self-pay | Admitting: Internal Medicine

## 2017-01-26 VITALS — BP 130/78 | HR 78 | Temp 97.2°F | Ht 61.0 in | Wt 135.0 lb

## 2017-01-26 DIAGNOSIS — R319 Hematuria, unspecified: Secondary | ICD-10-CM

## 2017-01-26 DIAGNOSIS — F439 Reaction to severe stress, unspecified: Secondary | ICD-10-CM

## 2017-01-26 DIAGNOSIS — R946 Abnormal results of thyroid function studies: Secondary | ICD-10-CM

## 2017-01-26 DIAGNOSIS — R8290 Unspecified abnormal findings in urine: Secondary | ICD-10-CM | POA: Diagnosis not present

## 2017-01-26 DIAGNOSIS — Z0000 Encounter for general adult medical examination without abnormal findings: Secondary | ICD-10-CM

## 2017-01-26 DIAGNOSIS — E039 Hypothyroidism, unspecified: Secondary | ICD-10-CM

## 2017-01-26 DIAGNOSIS — E7849 Other hyperlipidemia: Secondary | ICD-10-CM

## 2017-01-26 DIAGNOSIS — K219 Gastro-esophageal reflux disease without esophagitis: Secondary | ICD-10-CM

## 2017-01-26 DIAGNOSIS — F411 Generalized anxiety disorder: Secondary | ICD-10-CM | POA: Diagnosis not present

## 2017-01-26 DIAGNOSIS — R7989 Other specified abnormal findings of blood chemistry: Secondary | ICD-10-CM

## 2017-01-26 DIAGNOSIS — E784 Other hyperlipidemia: Secondary | ICD-10-CM | POA: Diagnosis not present

## 2017-01-26 LAB — POCT URINALYSIS DIPSTICK
Bilirubin, UA: NEGATIVE
Glucose, UA: NEGATIVE
Ketones, UA: NEGATIVE
Nitrite, UA: NEGATIVE
PH UA: 6 (ref 5.0–8.0)
PROTEIN UA: NEGATIVE
SPEC GRAV UA: 1.015 (ref 1.010–1.025)
UROBILINOGEN UA: 0.2 U/dL
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
## 2017-01-26 NOTE — Patient Instructions (Signed)
It was a pleasure to see you today. Return in 2 months for labs only including TSH and hepatitis C antibody. Otherwise return in one year or as needed. Medications can be refilled at your request. Have annual mammogram. Have annual flu vaccine. Urine culture pending.

## 2017-01-27 LAB — URINE CULTURE

## 2017-02-04 ENCOUNTER — Other Ambulatory Visit: Payer: Self-pay | Admitting: Internal Medicine

## 2017-04-01 ENCOUNTER — Other Ambulatory Visit: Payer: Commercial Managed Care - HMO | Admitting: Internal Medicine

## 2017-04-02 ENCOUNTER — Other Ambulatory Visit: Payer: 59 | Admitting: Internal Medicine

## 2017-04-02 DIAGNOSIS — E039 Hypothyroidism, unspecified: Secondary | ICD-10-CM

## 2017-04-02 DIAGNOSIS — Z0000 Encounter for general adult medical examination without abnormal findings: Secondary | ICD-10-CM

## 2017-04-02 LAB — TSH: TSH: 0.23 m[IU]/L — AB

## 2017-04-03 LAB — HEPATITIS C ANTIBODY: HCV AB: NEGATIVE

## 2017-04-29 ENCOUNTER — Encounter: Payer: Self-pay | Admitting: Internal Medicine

## 2017-04-29 DIAGNOSIS — Z1231 Encounter for screening mammogram for malignant neoplasm of breast: Secondary | ICD-10-CM | POA: Diagnosis not present

## 2017-05-26 ENCOUNTER — Other Ambulatory Visit: Payer: Self-pay | Admitting: Internal Medicine

## 2017-06-11 ENCOUNTER — Encounter: Payer: Self-pay | Admitting: Internal Medicine

## 2017-06-11 ENCOUNTER — Ambulatory Visit (INDEPENDENT_AMBULATORY_CARE_PROVIDER_SITE_OTHER): Payer: 59 | Admitting: Internal Medicine

## 2017-06-11 VITALS — BP 132/80 | HR 69 | Temp 97.6°F | Wt 134.0 lb

## 2017-06-11 DIAGNOSIS — J069 Acute upper respiratory infection, unspecified: Secondary | ICD-10-CM | POA: Diagnosis not present

## 2017-06-11 DIAGNOSIS — Z8709 Personal history of other diseases of the respiratory system: Secondary | ICD-10-CM | POA: Diagnosis not present

## 2017-06-11 DIAGNOSIS — J309 Allergic rhinitis, unspecified: Secondary | ICD-10-CM

## 2017-06-11 MED ORDER — AZITHROMYCIN 250 MG PO TABS: ORAL_TABLET | ORAL | 0 refills | Status: DC

## 2017-06-11 MED ORDER — METHYLPREDNISOLONE ACETATE 80 MG/ML IJ SUSP
80.0000 mg | Freq: Once | INTRAMUSCULAR | Status: AC
  Administered 2017-06-11: 80 mg via INTRAMUSCULAR

## 2017-06-11 NOTE — Progress Notes (Signed)
   Subjective:    Patient ID: Christie Molina, female    DOB: 12/14/1960, 56 y.o.   MRN: [id number]  HPI 56 year old Female in today with respiratory congestion. Hasn't felt well. Has malaise and fatigue. No documented fever. Symptoms are vague. History of allergic rhinitis. She is not sure if this is an acute respiratory infection or allergic rhinitis.       Review of Systems see above     Objective:   Physical Exam Skin warm and dry. Nodes none. TMs slightly full. Pharynx clear. Neck supple. Chest clear. She sounds nasally congested when she speaks.       Assessment & Plan:  Acute URI  Plan: Depo-Medrol 80 mg IM for congestion. Zithromax Z-PAK take 2 tablets day one followed by 1 tablet days 2 through 5.

## 2017-06-28 ENCOUNTER — Other Ambulatory Visit: Payer: Self-pay | Admitting: Internal Medicine

## 2017-07-04 NOTE — Patient Instructions (Signed)
Zithromax Z-PAK take 2 tablets day 1 followed by 1 tablet days 2 through 5. Depo-Medrol 80 mg IM given for congestion

## 2017-08-16 ENCOUNTER — Ambulatory Visit: Payer: 59 | Admitting: Obstetrics & Gynecology

## 2017-08-23 ENCOUNTER — Ambulatory Visit: Payer: Self-pay | Admitting: Obstetrics & Gynecology

## 2017-08-30 ENCOUNTER — Other Ambulatory Visit: Payer: Self-pay | Admitting: Internal Medicine

## 2017-09-03 ENCOUNTER — Ambulatory Visit: Payer: 59 | Admitting: Obstetrics & Gynecology

## 2017-09-03 ENCOUNTER — Other Ambulatory Visit: Payer: Self-pay

## 2017-09-03 ENCOUNTER — Encounter: Payer: Self-pay | Admitting: Obstetrics & Gynecology

## 2017-09-03 VITALS — BP 120/70 | HR 68 | Resp 14 | Ht 61.5 in | Wt 135.0 lb

## 2017-09-03 DIAGNOSIS — Z01419 Encounter for gynecological examination (general) (routine) without abnormal findings: Secondary | ICD-10-CM | POA: Diagnosis not present

## 2017-09-03 NOTE — Progress Notes (Signed)
56 y.o. [id number] MarriedCaucasianF here for annual exam.  Doing well.  No vaginal bleeding.        Patient's last menstrual period was 07/22/2013 (exact date).          Sexually active: Yes.    The current method of family planning is post menopausal status.    Exercising: Yes.    walking Smoker:  no  Health Maintenance: Pap:  09/22/16 WNL  08/16/15, Negative with neg HR HPV History of abnormal Pap:  no MMG:  04/29/17 BIRADS 2 benign/density c Colonoscopy:  07/24/14, Normal, repeat in 10 years, [facility] BMD:   10/19/16 Osteopenia, -1.3  TDaP:  01/03/10 Pneumonia vaccine(s):  never Zostavax:   never Hep C testing: 04/02/17 Negative Screening Labs: PCP, Hb today: PCP, Urine today: not collected   reports that she quit smoking about 31 years ago. Her smoking use included cigarettes. She has a 0.40 pack-year smoking history. she has never used smokeless tobacco. She reports that she drinks alcohol. She reports that she does not use drugs.  Past Medical History:  Diagnosis Date  . Family history of anesthesia complication    mother n/v  . GERD (gastroesophageal reflux disease)   . Hyperlipidemia   . Thyroid disease   . Vitamin D deficiency     Past Surgical History:  Procedure Laterality Date  . COLONOSCOPY WITH PROPOFOL N/A 07/24/2014   Procedure: COLONOSCOPY WITH PROPOFOL;  Surgeon: Charolett BumpersMartin K Johnson, MD;  Location: [facility];  Service: Endoscopy;  Laterality: N/A;    Current Outpatient Medications  Medication Sig Dispense Refill  . Cholecalciferol (VITAMIN D PO) Take by mouth.    Marland Kitchen. ibuprofen (ADVIL,MOTRIN) 200 MG tablet Take 200 mg by mouth once as needed for headache or mild pain.    . Loratadine (ALAVERT PO) Take 1 tablet by mouth once as needed (allergies).    . meloxicam (MOBIC) 15 MG tablet Take 15 mg by mouth as needed for pain.    . pantoprazole (PROTONIX) 40 MG tablet TAKE 1 TABLET BY MOUTH DAILY 90 tablet 3  . simvastatin (ZOCOR) 20 MG tablet TAKE 1  TABLET(20 MG) BY MOUTH EVERY EVENING 90 tablet 1  . SYNTHROID 88 MCG tablet TAKE 1 TABLET BY MOUTH EVERY DAY 90 tablet 3   No current facility-administered medications for this visit.     Family History  Problem Relation Age of Onset  . Heart disease Father     ROS:  Pertinent items are noted in HPI.  Otherwise, a comprehensive ROS was negative.  Exam:   BP 120/70 (BP Location: Right Arm, Patient Position: Sitting, Cuff Size: Normal)   Pulse 68   Resp 14   Ht 5' 1.5" (1.562 m)   Wt 135 lb (61.2 kg)   LMP 07/22/2013 (Exact Date)   BMI 25.09 kg/m     Height: 5' 1.5" (156.2 cm)  Ht Readings from Last 3 Encounters:  09/03/17 5' 1.5" (1.562 m)  01/26/17 5\' 1"  (1.549 m)  09/22/16 5' 1.25" (1.556 m)    General appearance: alert, cooperative and appears stated age Head: Normocephalic, without obvious abnormality, atraumatic Neck: no adenopathy, supple, symmetrical, trachea midline and thyroid normal to inspection and palpation Lungs: clear to auscultation bilaterally Breasts: normal appearance, no masses or tenderness Heart: regular rate and rhythm Abdomen: soft, non-tender; bowel sounds normal; no masses,  no organomegaly Extremities: extremities normal, atraumatic, no cyanosis or edema Skin: Skin color, texture, turgor normal. No rashes or lesions Lymph nodes: Cervical, supraclavicular, and  axillary nodes normal. No abnormal inguinal nodes palpated Neurologic: Grossly normal   Pelvic: External genitalia:  no lesions              Urethra:  normal appearing urethra with no masses, tenderness or lesions              Bartholins and Skenes: normal                 Vagina: normal appearing vagina with normal color and discharge, no lesions              Cervix: no lesions              Pap taken: No. Bimanual Exam:  Uterus:  normal size, contour, position, consistency, mobility, non-tender              Adnexa: normal adnexa and no mass, fullness, tenderness                Rectovaginal: Confirms               Anus:  normal sphincter tone, no lesions  Chaperone was present for exam.  A:  Well Woman with normal exam PMP, no HRT Hypothyroidism Vit D deficiency GERD Elevated lipids  P:   Mammogram guidelines reviewed.  Discussed 3D imaging. pap smear with neg HR HPV 2016.  No pap smear obtained today. Lab work and vaccinations are UTD.  Sees Dr. Lenord FellersBaxley. return annually or prn

## 2017-09-24 ENCOUNTER — Ambulatory Visit: Payer: Commercial Managed Care - HMO | Admitting: Nurse Practitioner

## 2017-10-18 DIAGNOSIS — G5601 Carpal tunnel syndrome, right upper limb: Secondary | ICD-10-CM | POA: Diagnosis not present

## 2018-02-01 ENCOUNTER — Other Ambulatory Visit: Payer: Self-pay | Admitting: Internal Medicine

## 2018-04-25 ENCOUNTER — Other Ambulatory Visit: Payer: Self-pay | Admitting: Internal Medicine

## 2018-04-25 DIAGNOSIS — E785 Hyperlipidemia, unspecified: Secondary | ICD-10-CM

## 2018-04-25 DIAGNOSIS — E559 Vitamin D deficiency, unspecified: Secondary | ICD-10-CM

## 2018-04-25 DIAGNOSIS — E039 Hypothyroidism, unspecified: Secondary | ICD-10-CM

## 2018-04-25 DIAGNOSIS — Z0000 Encounter for general adult medical examination without abnormal findings: Secondary | ICD-10-CM

## 2018-04-29 ENCOUNTER — Other Ambulatory Visit: Payer: 59 | Admitting: Internal Medicine

## 2018-04-29 DIAGNOSIS — E559 Vitamin D deficiency, unspecified: Secondary | ICD-10-CM

## 2018-04-29 DIAGNOSIS — E785 Hyperlipidemia, unspecified: Secondary | ICD-10-CM

## 2018-04-29 DIAGNOSIS — E039 Hypothyroidism, unspecified: Secondary | ICD-10-CM

## 2018-04-29 DIAGNOSIS — Z0000 Encounter for general adult medical examination without abnormal findings: Secondary | ICD-10-CM

## 2018-04-30 LAB — VITAMIN D 25 HYDROXY (VIT D DEFICIENCY, FRACTURES): Vit D, 25-Hydroxy: 35 ng/mL (ref 30–100)

## 2018-04-30 LAB — COMPLETE METABOLIC PANEL WITH GFR
AG Ratio: 1.9 (calc) (ref 1.0–2.5)
ALT: 12 U/L (ref 6–29)
AST: 17 U/L (ref 10–35)
Albumin: 4.6 g/dL (ref 3.6–5.1)
Alkaline phosphatase (APISO): 57 U/L (ref 33–130)
BUN: 11 mg/dL (ref 7–25)
CALCIUM: 9.8 mg/dL (ref 8.6–10.4)
CO2: 28 mmol/L (ref 20–32)
CREATININE: 0.9 mg/dL (ref 0.50–1.05)
Chloride: 100 mmol/L (ref 98–110)
GFR, EST AFRICAN AMERICAN: 82 mL/min/{1.73_m2} (ref >=60)
GFR, EST NON AFRICAN AMERICAN: 71 mL/min/{1.73_m2} (ref >=60)
GLOBULIN: 2.4 g/dL (ref 1.9–3.7)
GLUCOSE: 92 mg/dL (ref 65–99)
Potassium: 4.5 mmol/L (ref 3.5–5.3)
SODIUM: 137 mmol/L (ref 135–146)
TOTAL PROTEIN: 7 g/dL (ref 6.1–8.1)
Total Bilirubin: 0.6 mg/dL (ref 0.2–1.2)

## 2018-04-30 LAB — CBC WITH DIFFERENTIAL/PLATELET
Basophils Absolute: 49 cells/uL (ref 0–200)
Basophils Relative: 1.3 %
EOS ABS: 110 {cells}/uL (ref 15–500)
Eosinophils Relative: 2.9 %
HEMATOCRIT: 39.6 % (ref 35.0–45.0)
Hemoglobin: 13.7 g/dL (ref 11.7–15.5)
LYMPHS ABS: 1296 {cells}/uL (ref 850–3900)
MCH: 29.8 pg (ref 27.0–33.0)
MCHC: 34.6 g/dL (ref 32.0–36.0)
MCV: 86.3 fL (ref 80.0–100.0)
MPV: 10.4 fL (ref 7.5–12.5)
Monocytes Relative: 8.7 %
NEUTROS PCT: 53 %
Neutro Abs: 2014 cells/uL (ref 1500–7800)
Platelets: 218 10*3/uL (ref 140–400)
RBC: 4.59 10*6/uL (ref 3.80–5.10)
RDW: 12.9 % (ref 11.0–15.0)
Total Lymphocyte: 34.1 %
WBC: 3.8 10*3/uL (ref 3.8–10.8)
WBCMIX: 331 {cells}/uL (ref 200–950)

## 2018-04-30 LAB — LIPID PANEL
CHOLESTEROL: 235 mg/dL — AB (ref <200)
HDL: 85 mg/dL (ref >50)
LDL Cholesterol (Calc): 133 mg/dL (calc) — ABNORMAL HIGH
Non-HDL Cholesterol (Calc): 150 mg/dL (calc) — ABNORMAL HIGH (ref <130)
Total CHOL/HDL Ratio: 2.8 (calc) (ref <5.0)
Triglycerides: 77 mg/dL (ref <150)

## 2018-04-30 LAB — TSH: TSH: 0.73 mIU/L (ref 0.40–4.50)

## 2018-05-02 ENCOUNTER — Other Ambulatory Visit: Payer: Self-pay | Admitting: Internal Medicine

## 2018-05-02 ENCOUNTER — Ambulatory Visit (INDEPENDENT_AMBULATORY_CARE_PROVIDER_SITE_OTHER): Payer: 59 | Admitting: Internal Medicine

## 2018-05-02 ENCOUNTER — Encounter: Payer: Self-pay | Admitting: Internal Medicine

## 2018-05-02 VITALS — BP 122/72 | HR 80 | Ht 61.5 in | Wt 136.0 lb

## 2018-05-02 DIAGNOSIS — E7800 Pure hypercholesterolemia, unspecified: Secondary | ICD-10-CM

## 2018-05-02 DIAGNOSIS — Z0000 Encounter for general adult medical examination without abnormal findings: Secondary | ICD-10-CM | POA: Diagnosis not present

## 2018-05-02 DIAGNOSIS — K219 Gastro-esophageal reflux disease without esophagitis: Secondary | ICD-10-CM

## 2018-05-02 DIAGNOSIS — E039 Hypothyroidism, unspecified: Secondary | ICD-10-CM | POA: Diagnosis not present

## 2018-05-02 DIAGNOSIS — J309 Allergic rhinitis, unspecified: Secondary | ICD-10-CM

## 2018-05-02 LAB — POCT URINALYSIS DIPSTICK
APPEARANCE: NORMAL
Bilirubin, UA: NEGATIVE
Glucose, UA: NEGATIVE
Ketones, UA: NEGATIVE
LEUKOCYTES UA: NEGATIVE
Nitrite, UA: NEGATIVE
Odor: NORMAL
PH UA: 6.5 (ref 5.0–8.0)
Protein, UA: NEGATIVE
Spec Grav, UA: 1.01 (ref 1.010–1.025)
UROBILINOGEN UA: 0.2 U/dL

## 2018-05-02 MED ORDER — DICYCLOMINE HCL 20 MG PO TABS: ORAL_TABLET | ORAL | 2 refills | Status: DC
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
## 2018-05-02 NOTE — Patient Instructions (Signed)
It was a pleasure to see you today.  Try Bentyl for irritable bowel syndrome.  Resume lipid-lowering medication.  Follow-up in 6 months.  Try 1 month of Nexium before going back to Protonix.  Call if GE reflux does not come under good control.  Continue thyroid replacement medication

## 2018-05-16 ENCOUNTER — Encounter: Payer: Self-pay | Admitting: Internal Medicine

## 2018-05-16 DIAGNOSIS — Z1231 Encounter for screening mammogram for malignant neoplasm of breast: Secondary | ICD-10-CM | POA: Diagnosis not present

## 2018-07-19 ENCOUNTER — Ambulatory Visit (INDEPENDENT_AMBULATORY_CARE_PROVIDER_SITE_OTHER): Payer: 59 | Admitting: Internal Medicine

## 2018-07-19 ENCOUNTER — Encounter: Payer: Self-pay | Admitting: Internal Medicine

## 2018-07-19 VITALS — BP 140/80 | HR 72 | Temp 98.6°F | Ht 61.5 in | Wt 136.0 lb

## 2018-07-19 DIAGNOSIS — R1013 Epigastric pain: Secondary | ICD-10-CM

## 2018-07-19 DIAGNOSIS — K219 Gastro-esophageal reflux disease without esophagitis: Secondary | ICD-10-CM | POA: Diagnosis not present

## 2018-07-19 DIAGNOSIS — Z23 Encounter for immunization: Secondary | ICD-10-CM

## 2018-07-20 LAB — H. PYLORI BREATH TEST: H. pylori Breath Test: NOT DETECTED

## 2018-07-22 NOTE — Progress Notes (Signed)
   Subjective:    Patient ID: Christie Molina, female    DOB: [birth year], 57 y.o.   MRN: [id number]  HPI She was seen in July for routine physical examination.  She has a history of GE reflux and reportedly an GE reflux was worse.  I recommended she try Nexium 40 mg daily for a month and then return back to Protonix.  She had noticed hoarseness in her voice because of reflux symptoms she felt.  She has a history of hypothyroidism.  History of hyperlipidemia treated with Zocor and history of irritable bowel syndrome.  Now says that GE reflux has gotten worse once again.  Does not feel that Nexium helped all that much and Protonix is certainly not working.  She is worried about Barrett's esophagus.  She would like to see GI physician.    Review of Systems is complaining of epigastric pain.  H. pylori testing done     Objective:   Physical Exam  Mild tenderness in the epigastrium without rebound      Assessment & Plan:  Epigastric pain  Worsening of GE reflux-despite trial of 2 different 2 PPIs  Plan: H. pylori breath test negative.  Refer to Dr. Ewing Schlein.

## 2018-07-22 NOTE — Patient Instructions (Signed)
Referred to gastroenterologist.  Flu vaccine given.

## 2018-07-27 ENCOUNTER — Other Ambulatory Visit: Payer: Self-pay | Admitting: Internal Medicine

## 2018-08-04 DIAGNOSIS — K219 Gastro-esophageal reflux disease without esophagitis: Secondary | ICD-10-CM | POA: Diagnosis not present

## 2018-08-05 HISTORY — PX: UPPER GASTROINTESTINAL ENDOSCOPY: SHX188

## 2018-08-11 DIAGNOSIS — K210 Gastro-esophageal reflux disease with esophagitis: Secondary | ICD-10-CM | POA: Diagnosis not present

## 2018-08-11 DIAGNOSIS — K2980 Duodenitis without bleeding: Secondary | ICD-10-CM | POA: Diagnosis not present

## 2018-08-11 DIAGNOSIS — K317 Polyp of stomach and duodenum: Secondary | ICD-10-CM | POA: Diagnosis not present

## 2018-11-07 DIAGNOSIS — Z111 Encounter for screening for respiratory tuberculosis: Secondary | ICD-10-CM | POA: Diagnosis not present

## 2018-11-18 ENCOUNTER — Ambulatory Visit (INDEPENDENT_AMBULATORY_CARE_PROVIDER_SITE_OTHER): Payer: 59 | Admitting: Obstetrics & Gynecology

## 2018-11-18 ENCOUNTER — Other Ambulatory Visit (HOSPITAL_COMMUNITY)
Admission: RE | Admit: 2018-11-18 | Discharge: 2018-11-18 | Disposition: A | Discharge status: Home / Self Care | Payer: 59 | Source: Ambulatory Visit | Attending: Obstetrics & Gynecology | Admitting: Obstetrics & Gynecology

## 2018-11-18 ENCOUNTER — Encounter: Payer: Self-pay | Admitting: Obstetrics & Gynecology

## 2018-11-18 VITALS — BP 120/76 | HR 68 | Resp 16 | Ht 61.5 in | Wt 131.2 lb

## 2018-11-18 DIAGNOSIS — Z124 Encounter for screening for malignant neoplasm of cervix: Secondary | ICD-10-CM | POA: Diagnosis not present

## 2018-11-18 DIAGNOSIS — Z01419 Encounter for gynecological examination (general) (routine) without abnormal findings: Secondary | ICD-10-CM | POA: Diagnosis not present

## 2018-11-18 NOTE — Progress Notes (Signed)
58 y.o. G18P2002 Married White or Caucasian female here for annual exam.  Doing well.  Denies vaginal bleeding.  Was laid off this past year and is working with a temporary placement.  Company has opportunities so keeps hoping something will come along.  Current work is not long-term.   PCP:  Dr. Lenord Fellers.    Patient's last menstrual period was 07/22/2013 (exact date).          Sexually active: Yes.    The current method of family planning is post menopausal status.    Exercising: No.   Smoker:  no  Health Maintenance: Pap:  09/22/16 Neg.   08/16/15 Neg. HR HPV:neg  History of abnormal Pap:  no MMG:  05/16/18 BIRADS2:benign  Colonoscopy:  2015 normal. F/u 10 years  BMD:   10/19/16 osteopenia, -1.3  TDaP:  2011 Pneumonia vaccine(s):  n/a Shingrix:   n/a Hep C testing: 04/02/17 Neg  Screening Labs: PCP   reports that she quit smoking about 32 years ago. Her smoking use included cigarettes. She has a 0.40 pack-year smoking history. She has never used smokeless tobacco. She reports current alcohol use. She reports that she does not use drugs.  Past Medical History:  Diagnosis Date  . GERD (gastroesophageal reflux disease)   . Hyperlipidemia   . Thyroid disease   . Vitamin D deficiency     Past Surgical History:  Procedure Laterality Date  . COLONOSCOPY WITH PROPOFOL N/A 07/24/2014   Procedure: COLONOSCOPY WITH PROPOFOL;  Surgeon: Charolett Bumpers, MD;  Location: [facility];  Service: Endoscopy;  Laterality: N/A;  . UPPER GASTROINTESTINAL ENDOSCOPY  08/2018    Current Outpatient Medications  Medication Sig Dispense Refill  . Cholecalciferol (VITAMIN D PO) Take by mouth.    . dicyclomine (BENTYL) 20 MG tablet One po before meals as needed 30 tablet 2  . Loratadine (ALAVERT PO) Take 1 tablet by mouth once as needed (allergies).    . meloxicam (MOBIC) 15 MG tablet Take 15 mg by mouth as needed for pain.    . simvastatin (ZOCOR) 20 MG tablet TAKE 1 TABLET(20 MG) BY MOUTH EVERY  EVENING 90 tablet 2  . SYNTHROID 88 MCG tablet TAKE 1 TABLET BY MOUTH EVERY DAY 90 tablet 0   No current facility-administered medications for this visit.     Family History  Problem Relation Age of Onset  . Heart disease Father     Review of Systems  All other systems reviewed and are negative.   Exam:   BP 120/76 (BP Location: Left Arm, Patient Position: Sitting, Cuff Size: Normal)   Pulse 68   Resp 16   Ht 5' 1.5" (1.562 m)   Wt 131 lb 3.2 oz (59.5 kg)   LMP 07/22/2013 (Exact Date)   BMI 24.39 kg/m      Height: 5' 1.5" (156.2 cm)  Ht Readings from Last 3 Encounters:  11/18/18 5' 1.5" (1.562 m)  07/19/18 5' 1.5" (1.562 m)  05/02/18 5' 1.5" (1.562 m)    General appearance: alert, cooperative and appears stated age Head: Normocephalic, without obvious abnormality, atraumatic Neck: no adenopathy, supple, symmetrical, trachea midline and thyroid normal to inspection and palpation Lungs: clear to auscultation bilaterally Breasts: normal appearance, no masses or tenderness Heart: regular rate and rhythm Abdomen: soft, non-tender; bowel sounds normal; no masses,  no organomegaly Extremities: extremities normal, atraumatic, no cyanosis or edema Skin: Skin color, texture, turgor normal. No rashes or lesions Lymph nodes: Cervical, supraclavicular, and axillary nodes normal. No  abnormal inguinal nodes palpated Neurologic: Grossly normal   Pelvic: External genitalia:  no lesions              Urethra:  normal appearing urethra with no masses, tenderness or lesions              Bartholins and Skenes: normal                 Vagina: normal appearing vagina with normal color and discharge, no lesions              Cervix: no lesions              Pap taken: Yes.   Bimanual Exam:  Uterus:  normal size, contour, position, consistency, mobility, non-tender              Adnexa: normal adnexa and no mass, fullness, tenderness               Rectovaginal: Confirms               Anus:   normal sphincter tone, no lesions  Chaperone was present for exam.  A:  Well Woman with normal exam PMP, no HRT Hypothyroidism H/O elevated lipids GERD  P:   Mammogram guidelines reviewed.  Doing yearly. Colonoscopy screening UTD pap smear with HR HPV obtained today Lab work done with Dr. Lenord Fellers in July. D/w pt shingrix vaccination.  Not sure about this right now.  Aware Tdap due 2021. Return annually or prn

## 2018-11-22 LAB — CYTOLOGY - PAP
Diagnosis: NEGATIVE
HPV (WINDOPATH): NOT DETECTED

## 2019-03-02 ENCOUNTER — Telehealth: Payer: Self-pay | Admitting: Internal Medicine

## 2019-03-02 NOTE — Telephone Encounter (Signed)
LVM to CB and schedule CPE and Labs, talk about satin medications

## 2019-03-02 NOTE — Telephone Encounter (Signed)
Schedule visit tomorrow

## 2019-03-02 NOTE — Telephone Encounter (Signed)
Patient called she is having allergy symptoms runny nose runny eyes, she wants to know if she can come to get an allergy shot.

## 2019-03-07 ENCOUNTER — Encounter: Payer: Self-pay | Admitting: Internal Medicine

## 2019-03-07 ENCOUNTER — Ambulatory Visit (INDEPENDENT_AMBULATORY_CARE_PROVIDER_SITE_OTHER): Payer: 59 | Admitting: Internal Medicine

## 2019-03-07 VITALS — Ht 61.5 in

## 2019-03-07 DIAGNOSIS — J309 Allergic rhinitis, unspecified: Secondary | ICD-10-CM | POA: Diagnosis not present

## 2019-03-07 MED ORDER — METHYLPREDNISOLONE ACETATE 80 MG/ML IJ SUSP
80.0000 mg | Freq: Once | INTRAMUSCULAR | Status: AC
  Administered 2019-03-07: 16:00:00 80 mg via INTRAMUSCULAR

## 2019-03-07 NOTE — Progress Notes (Signed)
   Subjective:    Patient ID: Christie Molina, female    DOB: 07-26-1961, 58 y.o.   MRN: [id number]  HPI 58 year old Female with history of allergic rhinitis treated with over-the-counter allergy medication in today with complaint of flare of allergy symptoms.  No fever or shaking chills.  No travel history.  No sore throat.  No cough.  Mainly maxillary sinus congestion and postnasal drip.  Patient says injection of Depo-Medrol worked well for her in the past and she would like to get this again today.    Review of Systems see above     Objective:   Physical Exam  Temp taken on arrival. She is afebrile.  Skin warm and dry.  Nodes none.  Pharynx is clear.  TMs are clear.  Neck is supple.  No adenopathy.  Chest is clear to auscultation without rales or wheezing.       Assessment & Plan:  Exacerbation of allergic rhinitis symptoms  Plan: Depo-Medrol 80 mg IM.  Continue over-the-counter antihistamines previously recommended.  Return as needed.

## 2019-03-19 NOTE — Patient Instructions (Signed)
Depo-Medrol 80 mg IM for allergy symptom exacerbation.  Continue over-the-counter antihistamine as previously directed.

## 2019-04-28 ENCOUNTER — Other Ambulatory Visit: Payer: Self-pay | Admitting: Internal Medicine

## 2019-06-01 ENCOUNTER — Other Ambulatory Visit: Payer: 59 | Admitting: Internal Medicine

## 2019-06-02 ENCOUNTER — Other Ambulatory Visit: Payer: Self-pay

## 2019-06-02 ENCOUNTER — Other Ambulatory Visit: Payer: 59 | Admitting: Internal Medicine

## 2019-06-02 DIAGNOSIS — E039 Hypothyroidism, unspecified: Secondary | ICD-10-CM

## 2019-06-02 DIAGNOSIS — K219 Gastro-esophageal reflux disease without esophagitis: Secondary | ICD-10-CM

## 2019-06-02 DIAGNOSIS — E7800 Pure hypercholesterolemia, unspecified: Secondary | ICD-10-CM

## 2019-06-03 LAB — CBC WITH DIFFERENTIAL/PLATELET
Absolute Monocytes: 372 cells/uL (ref 200–950)
Basophils Absolute: 60 cells/uL (ref 0–200)
Basophils Relative: 1.5 %
Eosinophils Absolute: 80 cells/uL (ref 15–500)
Eosinophils Relative: 2 %
HCT: 42 % (ref 35.0–45.0)
Hemoglobin: 14.2 g/dL (ref 11.7–15.5)
Lymphs Abs: 1300 cells/uL (ref 850–3900)
MCH: 29.6 pg (ref 27.0–33.0)
MCHC: 33.8 g/dL (ref 32.0–36.0)
MCV: 87.7 fL (ref 80.0–100.0)
MPV: 10.3 fL (ref 7.5–12.5)
Monocytes Relative: 9.3 %
Neutro Abs: 2188 cells/uL (ref 1500–7800)
Neutrophils Relative %: 54.7 %
Platelets: 214 10*3/uL (ref 140–400)
RBC: 4.79 10*6/uL (ref 3.80–5.10)
RDW: 12.6 % (ref 11.0–15.0)
Total Lymphocyte: 32.5 %
WBC: 4 10*3/uL (ref 3.8–10.8)

## 2019-06-03 LAB — COMPLETE METABOLIC PANEL WITH GFR
AG Ratio: 2 (calc) (ref 1.0–2.5)
ALT: 11 U/L (ref 6–29)
AST: 17 U/L (ref 10–35)
Albumin: 4.5 g/dL (ref 3.6–5.1)
Alkaline phosphatase (APISO): 58 U/L (ref 37–153)
BUN: 9 mg/dL (ref 7–25)
CO2: 27 mmol/L (ref 20–32)
Calcium: 9.6 mg/dL (ref 8.6–10.4)
Chloride: 101 mmol/L (ref 98–110)
Creat: 0.71 mg/dL (ref 0.50–1.05)
GFR, Est African American: 108 mL/min/{1.73_m2} (ref >=60)
GFR, Est Non African American: 93 mL/min/{1.73_m2} (ref >=60)
Globulin: 2.3 g/dL (calc) (ref 1.9–3.7)
Glucose, Bld: 88 mg/dL (ref 65–99)
Potassium: 4.3 mmol/L (ref 3.5–5.3)
Sodium: 137 mmol/L (ref 135–146)
Total Bilirubin: 0.8 mg/dL (ref 0.2–1.2)
Total Protein: 6.8 g/dL (ref 6.1–8.1)

## 2019-06-03 LAB — LIPID PANEL
Cholesterol: 212 mg/dL — ABNORMAL HIGH (ref <200)
HDL: 90 mg/dL (ref >=50)
LDL Cholesterol (Calc): 108 mg/dL (calc) — ABNORMAL HIGH
Non-HDL Cholesterol (Calc): 122 mg/dL (calc) (ref <130)
Total CHOL/HDL Ratio: 2.4 (calc) (ref <5.0)
Triglycerides: 56 mg/dL (ref <150)

## 2019-06-03 LAB — VITAMIN D 25 HYDROXY (VIT D DEFICIENCY, FRACTURES): Vit D, 25-Hydroxy: 30 ng/mL (ref 30–100)

## 2019-06-03 LAB — TSH: TSH: 0.08 mIU/L — ABNORMAL LOW (ref 0.40–4.50)

## 2019-06-05 ENCOUNTER — Encounter: Payer: Self-pay | Admitting: Internal Medicine

## 2019-06-05 ENCOUNTER — Other Ambulatory Visit: Payer: Self-pay

## 2019-06-05 ENCOUNTER — Telehealth: Payer: Self-pay | Admitting: Internal Medicine

## 2019-06-05 ENCOUNTER — Ambulatory Visit (INDEPENDENT_AMBULATORY_CARE_PROVIDER_SITE_OTHER): Payer: 59 | Admitting: Internal Medicine

## 2019-06-05 VITALS — BP 110/80 | HR 69 | Temp 98.3°F | Ht 61.5 in | Wt 125.0 lb

## 2019-06-05 DIAGNOSIS — R7989 Other specified abnormal findings of blood chemistry: Secondary | ICD-10-CM | POA: Diagnosis not present

## 2019-06-05 DIAGNOSIS — Z0000 Encounter for general adult medical examination without abnormal findings: Secondary | ICD-10-CM | POA: Diagnosis not present

## 2019-06-05 DIAGNOSIS — K219 Gastro-esophageal reflux disease without esophagitis: Secondary | ICD-10-CM

## 2019-06-05 DIAGNOSIS — Z23 Encounter for immunization: Secondary | ICD-10-CM

## 2019-06-05 DIAGNOSIS — E7800 Pure hypercholesterolemia, unspecified: Secondary | ICD-10-CM

## 2019-06-05 DIAGNOSIS — K317 Polyp of stomach and duodenum: Secondary | ICD-10-CM | POA: Insufficient documentation

## 2019-06-05 DIAGNOSIS — Z8709 Personal history of other diseases of the respiratory system: Secondary | ICD-10-CM

## 2019-06-05 DIAGNOSIS — E039 Hypothyroidism, unspecified: Secondary | ICD-10-CM | POA: Diagnosis not present

## 2019-06-05 LAB — POCT URINALYSIS DIPSTICK
Appearance: NEGATIVE
Bilirubin, UA: NEGATIVE
Blood, UA: NEGATIVE
Glucose, UA: NEGATIVE
Ketones, UA: NEGATIVE
Leukocytes, UA: NEGATIVE
Nitrite, UA: NEGATIVE
Odor: NEGATIVE
Protein, UA: NEGATIVE
Spec Grav, UA: 1.01 (ref 1.010–1.025)
Urobilinogen, UA: 0.2 E.U./dL
pH, UA: 6.5 (ref 5.0–8.0)

## 2019-06-05 MED ORDER — LEVOTHYROXINE SODIUM 75 MCG PO TABS: 75.0000 ug | ORAL_TABLET | Freq: Every day | ORAL | 0 refills | Status: DC

## 2019-06-05 NOTE — Patient Instructions (Signed)
It was a pleasure to see you today.  Flu vaccine given.  Continue current dose of statin medication.  Decrease levothyroxine to 0.075 mg daily and follow-up with TSH without office visit in 6 weeks.

## 2019-06-05 NOTE — Telephone Encounter (Signed)
LVM to CB and schedule 6 wk recheck, TSH

## 2019-06-05 NOTE — Progress Notes (Signed)
   Subjective:    Patient ID: Christie Molina, female    DOB: [birth year], 58 y.o.   MRN: [id number]  HPI 58 year old Female for health maintenance exam and evaluation of medical issues.  She had gastric polyps on recent endoscopy at [facility] in July.  Has been having issues with epigastric pain and says she is lost about 12 pounds.  She took H2 blocker for a while and is now back on Protonix 20 mg daily.  Longstanding history of GE reflux.  I records she has lost 11 pounds since last year.  History of hypothyroidism.  TSH is low today.  Will decrease dose of thyroid replacement to 0.075 mg daily and follow-up in 6 weeks with TSH without office visit.  Longstanding history of allergic rhinitis.  Plan colonoscopy by Dr. Earle Gell in 2015 with 10-year follow-up recommended.  She saw Dr. Sabra Heck, GYN physician in February.  Patient's tetanus immunization is due in 2021.  Flu vaccine given today.  Patient will continue to monitor her weight at home.    Review of Systems see above otherwise no new complaints     Objective:   Physical Exam Blood pressure 110/80 pulse 69 temperature 98.3 degrees weight 125 pounds.  BMI 23.24. Skin warm and dry.  Nodes none.  Neck is supple.  Chest clear to auscultation.  Cardiac exam regular rate and rhythm normal S1 and S2.  Breasts normal female without masses.  Cardiac exam regular rate and rhythm normal S1 and S2 without murmurs or gallops.  Abdomen no hepatosplenomegaly masses or tenderness appreciated.  Pelvic exam deferred to GYN physician.  No lower extremity edema.  Neuro no focal deficits on brief neurological exam.      Assessment & Plan:  Epigastric pain-improved and now on PPI at lower dose  History of gastric polyps now on low-dose PPI 20 mg daily.  Followed by [facility].  Polyps were noted on recent upper endoscopy.  History of hypothyroidism: has low TSH-decrease levothyroxine to 0.75 mg daily and follow-up in 6 weeks without office  visit.  Will have TSH drawn.  She prefers non-generic thyroid replacement medication  Allergic rhinitis takes loratadine  Health maintenance-flu vaccine given  History of irritable bowel syndrome treated with generic Bentyl  Hyperlipidemia-stable on generic simvastatin 20 mg daily  Plan: Flu vaccine given.  Repeat TSH in 6 weeks without office visit on lower dose of levothyroxine 0.075 mg daily.  Continue low-dose PPI.

## 2019-06-06 NOTE — Telephone Encounter (Signed)
Scheduled lab appointment

## 2019-07-20 ENCOUNTER — Other Ambulatory Visit: Payer: 59 | Admitting: Internal Medicine

## 2019-07-24 ENCOUNTER — Other Ambulatory Visit: Payer: Self-pay

## 2019-07-24 ENCOUNTER — Other Ambulatory Visit: Payer: 59 | Admitting: Internal Medicine

## 2019-07-24 DIAGNOSIS — E039 Hypothyroidism, unspecified: Secondary | ICD-10-CM

## 2019-07-24 LAB — TSH: TSH: 0.19 mIU/L — ABNORMAL LOW (ref 0.40–4.50)

## 2019-07-26 ENCOUNTER — Other Ambulatory Visit: Payer: Self-pay

## 2019-07-26 MED ORDER — LEVOTHYROXINE SODIUM 50 MCG PO TABS: 50.0000 ug | ORAL_TABLET | Freq: Every day | ORAL | 1 refills | Status: DC

## 2019-09-07 ENCOUNTER — Other Ambulatory Visit: Payer: 59 | Admitting: Internal Medicine

## 2019-09-07 ENCOUNTER — Other Ambulatory Visit: Payer: Self-pay

## 2019-09-07 DIAGNOSIS — E039 Hypothyroidism, unspecified: Secondary | ICD-10-CM

## 2019-09-08 ENCOUNTER — Ambulatory Visit (INDEPENDENT_AMBULATORY_CARE_PROVIDER_SITE_OTHER): Payer: 59 | Admitting: Internal Medicine

## 2019-09-08 DIAGNOSIS — E039 Hypothyroidism, unspecified: Secondary | ICD-10-CM

## 2019-09-08 LAB — TSH: TSH: 6.49 mIU/L — ABNORMAL HIGH (ref 0.40–4.50)

## 2019-09-08 MED ORDER — LEVOTHYROXINE SODIUM 75 MCG PO TABS: 75.0000 ug | ORAL_TABLET | Freq: Every day | ORAL | 3 refills | Status: DC

## 2019-10-01 ENCOUNTER — Encounter: Payer: Self-pay | Admitting: Internal Medicine

## 2019-10-01 NOTE — Progress Notes (Signed)
   Subjective:    Patient ID: Christie Molina, female    DOB: 1961-06-05, 58 y.o.   MRN: [id number]  HPI 58 year old Female seen today by interactive audio  communications due to coronavirus pandemic.  She is agreeable to visit in this format today.  She is identified using 2 identifiers as Christie Molina, a longstanding patient in this practice.  She has a history of hypothyroidism and is maintained on thyroid replacement medication.  She has no history of goiter.  Was seen in August for health maintenance exam.  History of allergic rhinitis.  Tetanus immunization due in 2021.  Dr. Sabra Heck is GYN physician.  Colonoscopy due in 2025.  History of GE reflux treated with PPI.  Gastric polyps on endoscopy at [facility] in July 2020.  When she was seen in August.  Her TSH was low at 0.08.  She said she felt well.  We agreed to recheck it in October.  She was maintained on levothyroxine 0.075 mg daily.  TSH was rechecked again in October and TSH was once again low at 0.19.  At that point thyroid replacement medication was decreased to levothyroxine 0.05 mg daily.  She has no issues with taking medication correctly.  TSH was checked on December 3 was elevated at 6.49.  It is not clear to me why it has fluctuated so much of the past few months.  The thyroid gland itself may be putting out more thyroid hormone at times than others.  In any case I have left her on 0.075 mg of levothyroxine and she will follow-up in early January with TSH only.  Patient reminded to take medication on empty stomach with no other medications.     Review of Systems see above     Objective:   Physical Exam  Looks well and reports no issues with how she feels.      Assessment & Plan:  Elevated TSH  Hypothyroidism  Plan: Continue with levothyroxine 0.975 mg daily.  Follow-up in early January with TSH only.

## 2019-10-01 NOTE — Patient Instructions (Signed)
Continue levothyroxine 0.075 mg daily and follow-up in early January with TSH only.

## 2019-10-20 ENCOUNTER — Other Ambulatory Visit: Payer: Self-pay

## 2019-10-20 ENCOUNTER — Other Ambulatory Visit: Payer: 59 | Admitting: Internal Medicine

## 2019-10-20 DIAGNOSIS — E039 Hypothyroidism, unspecified: Secondary | ICD-10-CM

## 2019-10-21 LAB — TSH: TSH: 0.67 mIU/L (ref 0.40–4.50)

## 2019-11-21 ENCOUNTER — Encounter: Payer: Self-pay | Admitting: Internal Medicine

## 2019-11-21 ENCOUNTER — Ambulatory Visit (INDEPENDENT_AMBULATORY_CARE_PROVIDER_SITE_OTHER): Payer: 59 | Admitting: Internal Medicine

## 2019-11-21 VITALS — Ht 61.5 in | Wt 125.0 lb

## 2019-11-21 DIAGNOSIS — Z20822 Contact with and (suspected) exposure to covid-19: Secondary | ICD-10-CM

## 2019-11-23 NOTE — Progress Notes (Signed)
   Subjective:    Patient ID: Christie Molina, female    DOB: [birth year], 60 y.o.   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  Patient is asymptomatic.  She works in a current area and socially distances herself from other coworkers.  Sometimes not wearing a mask but is appropriately socially distanced she says.  She has no symptoms of COVID-19.  Was told that the building would be closed for cleaning.  She is okay with working from home.  Due to the Coronavirus pandemic she is seen today by interactive audio and video telecommunications and identified as Christie Molina. Christie Molina is a patient in this practice using 2 identifiers.  She is agreeable to visit in this format today.  Her Covid risk of complication score is 0.  She has a history of hypothyroidism, hyperlipidemia and GE reflux.  She is married and has 2 adult daughters.  Mother is in a nursing facility.    Review of Systems she has no nausea myalgias fever chills dysgeusia or headache.     Objective:   Physical Exam  Reports that she is afebrile.  Seen virtually in no acute distress.      Assessment & Plan:  Possible COVID-19 exposure at work  Plan: Patient is able to work full-time and I think can just be observed at this point time.  I do not feel strongly about her being tested since the report is vague and she is asymptomatic at present time.  She will call if she develops symptoms and we can arrange for COVID-19 testing.

## 2019-11-25 NOTE — Patient Instructions (Signed)
Recommend checking your temperature daily for 2 weeks.  Call if you develop fever or other symptoms of COVID-19.  Do not advise COVID-19 testing at this time.

## 2019-12-11 ENCOUNTER — Other Ambulatory Visit: Payer: Self-pay | Admitting: Internal Medicine

## 2019-12-15 ENCOUNTER — Encounter: Payer: Self-pay | Admitting: Obstetrics & Gynecology

## 2019-12-15 ENCOUNTER — Other Ambulatory Visit: Payer: Self-pay

## 2019-12-15 ENCOUNTER — Ambulatory Visit (INDEPENDENT_AMBULATORY_CARE_PROVIDER_SITE_OTHER): Payer: 59 | Admitting: Obstetrics & Gynecology

## 2019-12-15 VITALS — BP 136/70 | HR 72 | Temp 96.8°F | Resp 10 | Ht 61.25 in | Wt 127.6 lb

## 2019-12-15 DIAGNOSIS — Z01419 Encounter for gynecological examination (general) (routine) without abnormal findings: Secondary | ICD-10-CM | POA: Diagnosis not present

## 2019-12-15 NOTE — Progress Notes (Signed)
59 y.o. G41P2002 Married White or Caucasian female here for annual exam.  Doing well.  Had endoscopy due to GERD.  Had gastric polyps removed and this really helped.  Saw Dr. Lenord Fellers in the summer.  Had blood work done.  Denies vaginal bleeding.      Patient's last menstrual period was 07/22/2013 (exact date).          Sexually active: Yes.    The current method of family planning is post menopausal status.    Exercising: No.  The patient does not participate in regular exercise at present. Smoker:  no  Health Maintenance: Pap:   11/18/18 Neg:Neg HR HPV  09/22/16 Neg.              08/16/15 Neg. HR HPV:neg  History of abnormal Pap:  no MMG:  05/15/19 BIRADS 1 negative/density c Colonoscopy:   2015 normal. F/u 10 years BMD:   10/19/16 osteopenia TDaP:  2011.  D/w pt today   Pneumonia vaccine(s):  n/a Shingrix:   no Hep C testing: 04/02/17 Neg Screening Labs: PCP   reports that she quit smoking about 33 years ago. Her smoking use included cigarettes. She has a 0.40 pack-year smoking history. She has never used smokeless tobacco. She reports current alcohol use. She reports that she does not use drugs.  Past Medical History:  Diagnosis Date  . GERD (gastroesophageal reflux disease)   . Hyperlipidemia   . Thyroid disease   . Vitamin D deficiency     Past Surgical History:  Procedure Laterality Date  . COLONOSCOPY WITH PROPOFOL N/A 07/24/2014   Procedure: COLONOSCOPY WITH PROPOFOL;  Surgeon: Charolett Bumpers, MD;  Location: [facility];  Service: Endoscopy;  Laterality: N/A;  . UPPER GASTROINTESTINAL ENDOSCOPY  08/2018    Current Outpatient Medications  Medication Sig Dispense Refill  . Cholecalciferol (VITAMIN D PO) Take by mouth.    . dicyclomine (BENTYL) 20 MG tablet One po before meals as needed 30 tablet 2  . famotidine (PEPCID) 20 MG tablet Take 20 mg by mouth daily.    Marland Kitchen levothyroxine (SYNTHROID) 75 MCG tablet Take 1 tablet (75 mcg total) by mouth daily. 90 tablet 3  .  Loratadine (ALAVERT PO) Take 1 tablet by mouth once as needed (allergies).    . meloxicam (MOBIC) 15 MG tablet Take 15 mg by mouth as needed for pain.    . simvastatin (ZOCOR) 20 MG tablet TAKE 1 TABLET(20 MG) BY MOUTH EVERY EVENING 90 tablet 2   No current facility-administered medications for this visit.    Family History  Problem Relation Age of Onset  . Heart disease Father     Review of Systems  All other systems reviewed and are negative.   Exam:   BP 136/70 (BP Location: Right Arm, Patient Position: Sitting, Cuff Size: Normal)   Pulse 72   Temp (!) 96.8 F (36 C) (Temporal)   Resp 10   Ht 5' 1.25" (1.556 m)   Wt 127 lb 9.6 oz (57.9 kg)   LMP 07/22/2013 (Exact Date)   BMI 23.91 kg/m   Height: 5' 1.25" (155.6 cm)  Ht Readings from Last 3 Encounters:  12/15/19 5' 1.25" (1.556 m)  11/21/19 5' 1.5" (1.562 m)  06/05/19 5' 1.5" (1.562 m)   General appearance: alert, cooperative and appears stated age Head: Normocephalic, without obvious abnormality, atraumatic Neck: no adenopathy, supple, symmetrical, trachea midline and thyroid normal to inspection and palpation Lungs: clear to auscultation bilaterally Breasts: normal appearance, no masses  or tenderness Heart: regular rate and rhythm Abdomen: soft, non-tender; bowel sounds normal; no masses,  no organomegaly Extremities: extremities normal, atraumatic, no cyanosis or edema Skin: Skin color, texture, turgor normal. No rashes or lesions Lymph nodes: Cervical, supraclavicular, and axillary nodes normal. No abnormal inguinal nodes palpated Neurologic: Grossly normal   Pelvic: External genitalia:  no lesions              Urethra:  normal appearing urethra with no masses, tenderness or lesions              Bartholins and Skenes: normal                 Vagina: normal appearing vagina with normal color and discharge, no lesions              Cervix: no lesions              Pap taken: Yes.   Bimanual Exam:  Uterus:  normal  size, contour, position, consistency, mobility, non-tender              Adnexa: normal adnexa and no mass, fullness, tenderness               Rectovaginal: Confirms               Anus:  normal sphincter tone, no lesions  Chaperone, Terence Lux, CMA, was present for exam.  A:  Well Woman with normal exam PMP, no HRT Hypothyroidism H/o elevated lipids GERD  P:   Mammogram guidelines reviewed pap smear with neg HR HPV 11/2018 Lab work done with Dr. Renold Genta in 2020 Aware tdap is due Colonoscopy and BMD are UTD Return annually or prn

## 2019-12-15 NOTE — Patient Instructions (Addendum)
[email or web address]  (mall)  [id number] (Option 2) from 8:00 am-5:00 pm, until all appointments have been filled.  Visit [email or web address] Text [email or web address] to [phone] to subscribe to updates via our text message opt-in system.  Text [email or web address] to [phone] to subscribe to updates in Spanish via our text message opt-in system.

## 2020-01-01 ENCOUNTER — Ambulatory Visit: Payer: 59 | Admitting: Obstetrics & Gynecology

## 2020-01-15 ENCOUNTER — Telehealth: Payer: Self-pay

## 2020-01-15 NOTE — Telephone Encounter (Signed)
Called and spoke with patient to let her know she needs to wait at least 2 weeks after COVID vaccine to get Depomedrol shot. She verbalized understanding, she received 1st COVID vaccine on 01/08/2020 and is scheduled for 2nd one on 01/30/2020.

## 2020-01-15 NOTE — Telephone Encounter (Signed)
I am reluctant to give Depomedrol this soon after Covid vaccine. Needs to be at least 2 weeks out

## 2020-01-15 NOTE — Telephone Encounter (Signed)
Patient called she said she is having allergies(can't breath, nose stuffed up), she said this happens to her twice a year. She said she usually comes for a depo medrol injection. She had her covid shot last week.

## 2020-03-13 ENCOUNTER — Telehealth: Payer: Self-pay

## 2020-03-13 NOTE — Telephone Encounter (Signed)
Patient is calling for an appointment regarding an infection.

## 2020-03-13 NOTE — Telephone Encounter (Signed)
AEX 12/15/2019  Spoke with pt. Pt reports having possible UTI sx of burning after urination x 4 days. Pt denies urinary frequency, urgency, fever, chills, back pain. Denies taking any OTC meds for treatment. Denies vaginal discharge, bleeding or odor.   Advised pt to have OV for further evaluation. Pt agreeable. Pt scheduled with Dr Hyacinth Meeker on 03/14/20 at 4:30 pm. Pt verbalized understanding to date and time of appt. CPS neg.   Routing to Dr Hyacinth Meeker for review.  Encounter closed

## 2020-03-14 ENCOUNTER — Other Ambulatory Visit: Payer: Self-pay

## 2020-03-14 ENCOUNTER — Ambulatory Visit: Payer: 59 | Admitting: Obstetrics & Gynecology

## 2020-03-14 ENCOUNTER — Encounter: Payer: Self-pay | Admitting: Obstetrics & Gynecology

## 2020-03-14 VITALS — BP 110/72 | HR 68 | Temp 97.0°F | Resp 16 | Wt 129.0 lb

## 2020-03-14 DIAGNOSIS — R319 Hematuria, unspecified: Secondary | ICD-10-CM

## 2020-03-14 DIAGNOSIS — N390 Urinary tract infection, site not specified: Secondary | ICD-10-CM | POA: Diagnosis not present

## 2020-03-14 DIAGNOSIS — N766 Ulceration of vulva: Secondary | ICD-10-CM | POA: Diagnosis not present

## 2020-03-14 LAB — POCT URINALYSIS DIPSTICK
Bilirubin, UA: NEGATIVE
Glucose, UA: NEGATIVE
Ketones, UA: NEGATIVE
Nitrite, UA: NEGATIVE
Protein, UA: NEGATIVE
Urobilinogen, UA: NEGATIVE E.U./dL — AB
pH, UA: 5 (ref 5.0–8.0)

## 2020-03-14 MED ORDER — VALACYCLOVIR HCL 1 G PO TABS: 1000.0000 mg | ORAL_TABLET | Freq: Two times a day (BID) | ORAL | 0 refills | Status: DC

## 2020-03-14 MED ORDER — LIDOCAINE 5 % EX OINT
1.0000 | TOPICAL_OINTMENT | Freq: Three times a day (TID) | CUTANEOUS | 0 refills | Status: DC
Start: 2020-03-14 — End: 2020-12-19

## 2020-03-14 NOTE — Progress Notes (Signed)
GYNECOLOGY  VISIT  CC:   Burning with urination  HPI: 59 y.o. G80P2002 Married White or Caucasian female here for complaint of vaginal irritation/burning sensation that she noticed on Sunday.  She was at [facility] last week.  She does have some delayed burning with urinary but it doesn't feel like dysuria.  Denies vaginal bleeding or vaginal discharge.  Denies urinary odor.  Denies pelvic pain and fever.    poct urine-rbc tr, wbc 1+  GYNECOLOGIC HISTORY: Patient's last menstrual period was 07/22/2013 (exact date). Contraception: postmenopausal Menopausal hormone therapy: none  Patient Active Problem List   Diagnosis Date Noted  . Multiple gastric polyps 06/05/2019  . Anxiety state, unspecified 07/06/2013  . Hypothyroidism 06/29/2011  . GE reflux 06/29/2011  . Hyperlipidemia 06/29/2011    Past Medical History:  Diagnosis Date  . GERD (gastroesophageal reflux disease)   . Hyperlipidemia   . Thyroid disease   . Vitamin D deficiency     Past Surgical History:  Procedure Laterality Date  . COLONOSCOPY WITH PROPOFOL N/A 07/24/2014   Procedure: COLONOSCOPY WITH PROPOFOL;  Surgeon: Charolett Bumpers, MD;  Location: [facility];  Service: Endoscopy;  Laterality: N/A;  . UPPER GASTROINTESTINAL ENDOSCOPY  08/2018    MEDS:   Current Outpatient Medications on File Prior to Visit  Medication Sig Dispense Refill  . Cholecalciferol (VITAMIN D PO) Take by mouth.    . famotidine (PEPCID) 20 MG tablet Take 20 mg by mouth daily.    Marland Kitchen levothyroxine (SYNTHROID) 75 MCG tablet Take 1 tablet (75 mcg total) by mouth daily. 90 tablet 3  . Loratadine (ALAVERT PO) Take 1 tablet by mouth once as needed (allergies).    . meloxicam (MOBIC) 15 MG tablet Take 15 mg by mouth as needed for pain.    . simvastatin (ZOCOR) 20 MG tablet TAKE 1 TABLET(20 MG) BY MOUTH EVERY EVENING 90 tablet 2   No current facility-administered medications on file prior to visit.    ALLERGIES: Patient has no known  allergies.  Family History  Problem Relation Age of Onset  . Heart disease Father     SH:  Married, non smoker  Review of Systems  Constitutional: Negative.   HENT: Negative.   Eyes: Negative.   Respiratory: Negative.   Cardiovascular: Negative.   Gastrointestinal: Negative.   Endocrine: Negative.   Genitourinary:       Vaginal irritation/burning  Musculoskeletal: Negative.   Skin: Negative.   Allergic/Immunologic: Negative.   Neurological: Negative.   Hematological: Negative.   Psychiatric/Behavioral: Negative.     PHYSICAL EXAMINATION:    BP 110/72   Pulse 68   Temp (!) 97 F (36.1 C) (Skin)   Resp 16   Wt 129 lb (58.5 kg)   LMP 07/22/2013 (Exact Date)   BMI 24.18 kg/m     General appearance: alert, cooperative and appears stated age Lymph:  no inguinal LAD noted  Pelvic: External genitalia:  Ulcerated lesion inner left labia major c/w HSV lesion, right side without any findings.              Urethra:  normal appearing urethra with no masses, tenderness or lesions              Bartholins and Skenes: normal                 Vagina: normal appearing vagina with normal color and discharge, no lesions              Cervix:  no lesions              Bimanual Exam:  Uterus:  normal size, contour, position, consistency, mobility, non-tender              Adnexa: no mass, fullness, tenderness              Anus: no lesions  Chaperone, Royal Hawthorn, CMA, was present for exam.  Assessment: Ulcerated vulvar lesion c/w HSV  Plan: HSV PCR obtained today Valtrex 10mg  bid x 7-10 days Lidocaine ointment 5% topically every 6 hours as needed

## 2020-03-15 ENCOUNTER — Telehealth: Payer: Self-pay

## 2020-03-15 NOTE — Telephone Encounter (Signed)
Fax received from the pharmacy regarding Valtrex stating:  Drug not covered by patient plan. The preferred alternative is Acyclovir. Please call/fax the pharmacy to change medication along with strength, directions, quantity, and refills.   Please advise.

## 2020-03-16 LAB — HSV DNA BY PCR (REFERENCE LAB)
HSV 2 DNA: NEGATIVE
HSV-1 DNA: NEGATIVE

## 2020-03-18 NOTE — Telephone Encounter (Signed)
Spoke with patient, and she states that she is feeling "better", however the area has not "completely cleared". Patient states that she has seen the results in Mychart, but had already taken only one of the Valtrex pills. Routing to Dr. Hyacinth Meeker for review and to advise.

## 2020-03-18 NOTE — Telephone Encounter (Signed)
Patient returned call

## 2020-03-18 NOTE — Telephone Encounter (Signed)
Please let pt know her HSV testing was negative.  Can you get an update about how she is feeling.  If she is better, I think we do not need this prescription.  Please get update and then I can decide.

## 2020-03-18 NOTE — Telephone Encounter (Signed)
Tried calling patient, no answer. Left message for patient to call me back. 

## 2020-03-19 NOTE — Telephone Encounter (Signed)
Patient asked to talk with Christie Molina again.

## 2020-03-19 NOTE — Telephone Encounter (Signed)
Spoke with Dr. Hyacinth Meeker; patient should return to office for further testing. Patient should also use topical triple antibiotic ointment and take Valtrex prescription.  Advised patient of message from Dr. Hyacinth Meeker. When I advised patient to use ointment and start Valtrex, patient's response was "we'll see". Patient also states that she does not wish to return for an office visit at this time. Would like to see how her symptoms resolve over the next week and will call if she desires an appointment. Routing to Dr. Hyacinth Meeker for final review and to close encounter.

## 2020-06-06 ENCOUNTER — Encounter: Payer: 59 | Admitting: Internal Medicine

## 2020-06-07 LAB — HM MAMMOGRAPHY

## 2020-06-11 ENCOUNTER — Encounter: Payer: Self-pay | Admitting: Internal Medicine

## 2020-06-14 ENCOUNTER — Other Ambulatory Visit: Payer: 59 | Admitting: Internal Medicine

## 2020-06-17 ENCOUNTER — Other Ambulatory Visit: Payer: 59 | Admitting: Internal Medicine

## 2020-06-17 ENCOUNTER — Other Ambulatory Visit: Payer: Self-pay

## 2020-06-17 DIAGNOSIS — Z0000 Encounter for general adult medical examination without abnormal findings: Secondary | ICD-10-CM

## 2020-06-17 DIAGNOSIS — E7800 Pure hypercholesterolemia, unspecified: Secondary | ICD-10-CM

## 2020-06-17 DIAGNOSIS — E039 Hypothyroidism, unspecified: Secondary | ICD-10-CM

## 2020-06-17 DIAGNOSIS — K259 Gastric ulcer, unspecified as acute or chronic, without hemorrhage or perforation: Secondary | ICD-10-CM

## 2020-06-18 LAB — LIPID PANEL
Cholesterol: 202 mg/dL — ABNORMAL HIGH (ref <200)
HDL: 78 mg/dL (ref >=50)
LDL Cholesterol (Calc): 106 mg/dL (calc) — ABNORMAL HIGH
Non-HDL Cholesterol (Calc): 124 mg/dL (calc) (ref <130)
Total CHOL/HDL Ratio: 2.6 (calc) (ref <5.0)
Triglycerides: 86 mg/dL (ref <150)

## 2020-06-18 LAB — COMPLETE METABOLIC PANEL WITH GFR
AG Ratio: 1.7 (calc) (ref 1.0–2.5)
ALT: 15 U/L (ref 6–29)
AST: 20 U/L (ref 10–35)
Albumin: 4.3 g/dL (ref 3.6–5.1)
Alkaline phosphatase (APISO): 68 U/L (ref 37–153)
BUN: 9 mg/dL (ref 7–25)
CO2: 29 mmol/L (ref 20–32)
Calcium: 9.3 mg/dL (ref 8.6–10.4)
Chloride: 102 mmol/L (ref 98–110)
Creat: 0.85 mg/dL (ref 0.50–0.99)
GFR, Est African American: 86 mL/min/{1.73_m2} (ref >=60)
GFR, Est Non African American: 74 mL/min/{1.73_m2} (ref >=60)
Globulin: 2.5 g/dL (calc) (ref 1.9–3.7)
Glucose, Bld: 93 mg/dL (ref 65–99)
Potassium: 5 mmol/L (ref 3.5–5.3)
Sodium: 141 mmol/L (ref 135–146)
Total Bilirubin: 0.5 mg/dL (ref 0.2–1.2)
Total Protein: 6.8 g/dL (ref 6.1–8.1)

## 2020-06-18 LAB — CBC WITH DIFFERENTIAL/PLATELET
Absolute Monocytes: 400 cells/uL (ref 200–950)
Basophils Absolute: 19 cells/uL (ref 0–200)
Basophils Relative: 0.6 %
Eosinophils Absolute: 42 cells/uL (ref 15–500)
Eosinophils Relative: 1.3 %
HCT: 40.1 % (ref 35.0–45.0)
Hemoglobin: 13.6 g/dL (ref 11.7–15.5)
Lymphs Abs: 1430 cells/uL (ref 850–3900)
MCH: 29.9 pg (ref 27.0–33.0)
MCHC: 33.9 g/dL (ref 32.0–36.0)
MCV: 88.1 fL (ref 80.0–100.0)
MPV: 10.4 fL (ref 7.5–12.5)
Monocytes Relative: 12.5 %
Neutro Abs: 1309 cells/uL — ABNORMAL LOW (ref 1500–7800)
Neutrophils Relative %: 40.9 %
Platelets: 201 10*3/uL (ref 140–400)
RBC: 4.55 10*6/uL (ref 3.80–5.10)
RDW: 12.2 % (ref 11.0–15.0)
Total Lymphocyte: 44.7 %
WBC: 3.2 10*3/uL — ABNORMAL LOW (ref 3.8–10.8)

## 2020-06-18 LAB — TSH: TSH: 0.67 mIU/L (ref 0.40–4.50)

## 2020-06-20 ENCOUNTER — Ambulatory Visit (INDEPENDENT_AMBULATORY_CARE_PROVIDER_SITE_OTHER): Payer: 59 | Admitting: Internal Medicine

## 2020-06-20 ENCOUNTER — Encounter: Payer: Self-pay | Admitting: Internal Medicine

## 2020-06-20 ENCOUNTER — Other Ambulatory Visit: Payer: Self-pay

## 2020-06-20 VITALS — BP 120/80 | HR 65 | Ht 61.0 in | Wt 129.0 lb

## 2020-06-20 DIAGNOSIS — Z0000 Encounter for general adult medical examination without abnormal findings: Secondary | ICD-10-CM | POA: Diagnosis not present

## 2020-06-20 DIAGNOSIS — Z8719 Personal history of other diseases of the digestive system: Secondary | ICD-10-CM

## 2020-06-20 DIAGNOSIS — E039 Hypothyroidism, unspecified: Secondary | ICD-10-CM | POA: Diagnosis not present

## 2020-06-20 DIAGNOSIS — Z8709 Personal history of other diseases of the respiratory system: Secondary | ICD-10-CM

## 2020-06-20 DIAGNOSIS — Z23 Encounter for immunization: Secondary | ICD-10-CM | POA: Diagnosis not present

## 2020-06-20 DIAGNOSIS — J309 Allergic rhinitis, unspecified: Secondary | ICD-10-CM

## 2020-06-20 DIAGNOSIS — E7800 Pure hypercholesterolemia, unspecified: Secondary | ICD-10-CM

## 2020-06-20 DIAGNOSIS — M7918 Myalgia, other site: Secondary | ICD-10-CM

## 2020-06-20 LAB — POCT URINALYSIS DIPSTICK
Appearance: NEGATIVE
Bilirubin, UA: NEGATIVE
Blood, UA: NEGATIVE
Glucose, UA: NEGATIVE
Ketones, UA: NEGATIVE
Leukocytes, UA: NEGATIVE
Nitrite, UA: NEGATIVE
Odor: NEGATIVE
Protein, UA: NEGATIVE
Spec Grav, UA: 1.015 (ref 1.010–1.025)
Urobilinogen, UA: 0.2 E.U./dL
pH, UA: 6.5 (ref 5.0–8.0)

## 2020-06-20 NOTE — Patient Instructions (Signed)
Tetanus immunization update given today.  It was a pleasure to see you.  Continue current medications including vitamin D supplement and statin medication as well as thyroid replacement medication and Pepcid for GE reflux.  Return in 1 year or as needed.
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
## 2020-09-10 ENCOUNTER — Other Ambulatory Visit: Payer: Self-pay | Admitting: Internal Medicine

## 2020-12-16 ENCOUNTER — Other Ambulatory Visit: Payer: 59 | Admitting: Internal Medicine

## 2020-12-16 ENCOUNTER — Other Ambulatory Visit: Payer: Self-pay

## 2020-12-16 DIAGNOSIS — D72819 Decreased white blood cell count, unspecified: Secondary | ICD-10-CM

## 2020-12-16 LAB — CBC WITH DIFFERENTIAL/PLATELET
Absolute Monocytes: 435 {cells}/uL (ref 200–950)
Basophils Absolute: 62 {cells}/uL (ref 0–200)
Basophils Relative: 1.5 %
Eosinophils Absolute: 82 {cells}/uL (ref 15–500)
Eosinophils Relative: 2 %
HCT: 39.9 % (ref 35.0–45.0)
Hemoglobin: 13 g/dL (ref 11.7–15.5)
Lymphs Abs: 1718 {cells}/uL (ref 850–3900)
MCH: 29.1 pg (ref 27.0–33.0)
MCHC: 32.6 g/dL (ref 32.0–36.0)
MCV: 89.3 fL (ref 80.0–100.0)
MPV: 10.6 fL (ref 7.5–12.5)
Monocytes Relative: 10.6 %
Neutro Abs: 1804 {cells}/uL (ref 1500–7800)
Neutrophils Relative %: 44 %
Platelets: 212 Thousand/uL (ref 140–400)
RBC: 4.47 Million/uL (ref 3.80–5.10)
RDW: 12.4 % (ref 11.0–15.0)
Total Lymphocyte: 41.9 %
WBC: 4.1 Thousand/uL (ref 3.8–10.8)

## 2020-12-19 ENCOUNTER — Ambulatory Visit: Payer: 59 | Admitting: Internal Medicine

## 2020-12-19 ENCOUNTER — Encounter: Payer: Self-pay | Admitting: Internal Medicine

## 2020-12-19 ENCOUNTER — Other Ambulatory Visit: Payer: Self-pay

## 2020-12-19 VITALS — BP 130/80 | HR 64 | Ht 61.0 in | Wt 130.0 lb

## 2020-12-19 DIAGNOSIS — E7800 Pure hypercholesterolemia, unspecified: Secondary | ICD-10-CM | POA: Diagnosis not present

## 2020-12-19 DIAGNOSIS — E039 Hypothyroidism, unspecified: Secondary | ICD-10-CM | POA: Diagnosis not present

## 2020-12-19 DIAGNOSIS — R351 Nocturia: Secondary | ICD-10-CM

## 2020-12-19 NOTE — Progress Notes (Signed)
   Subjective:    Patient ID: Christie Molina, female    DOB: 21-Mar-1961, 60 y.o.   MRN: [id number]  HPI 60 year old Female in for 6 month follow up visit.  She has a history of hypothyroidism.  She is on thyroid replacement medication consisting of levothyroxine 75 mcg daily.  History of hyperlipidemia treated with Zocor 20 mg daily.  TSH was normal when checked in September. Lipid panel was near normal in September.  Total cholesterol was 202 and LDL cholesterol was 106.  She has a good HDL of 78 and triglycerides were low with 86 in September.  She will continue with same dose of statin medication.  She is here to discuss low white blood cell count that she had 6 months ago at 3200.  Never runs a very high white blood cell count.  White blood cell counts have ranged from 3200-5200  over the past 7 years with the average being around the high 3000s or low four thousands.  She suggested that we repeat her CBC in 6 months at visit in September 2021 rather than waiting a year.  Recent CBC shows white blood cell count 4100.  This is within normal limits.  Her hemoglobin is normal at 13 g and MCV is normal at 99.3 with a normal platelet count and differential.  She was reassured this was stable.  Her mother has been hospitalized with a urinary tract infection.  Mother has had dementia for several years and is at [facility] facility.  Patient sometimes has nocturia 2-3 times nightly.  We discussed this.  We talked about limiting fluid intake after dinner.  She goes to bed around 9:30 or 10 PM and gets up around 6:30 AM.  Does not have urinary incontinence.  We could refer her to Urology if this continues to be an issue.  I do not think she has a UTI.  She will return in 6 months for health maintenance exam.     Review of Systems see above     Objective:   Physical Exam BP 130/80 pulse 64 pulse ox 98% weight 130 pounds BMI 24.56.  Seen today in no acute distress.  CBC reviewed with  her.  Discussion regarding mother with dementia and nursing home with urinary tract infection now hospitalized.  Discussion regarding hospice care and when it would be appropriate to call in hospice.  Also discussion regarding nocturia     Assessment & Plan:  Repeat white blood cell count is within normal limits and is not concerning to me at this time  Situational stress with mother who has dementia and is in nursing facility but recently hospitalized with acute urinary infection.  Nocturia-decreased fluid intake prior to bedtime.  May be related more to habit than actual urge to urinate.  If she wakes up she typically goes to the bathroom.  Plan: Health maintenance exam due in 6 months with fasting labs.  We will not refer to urology at this point in time regarding nocturia.  CBC drawn in connection with this visit shows normal white blood cell count and differential.  6 months ago white blood cell count was 3200.  It is now normal at 4100.  Time spent with patient is 20 minutes

## 2020-12-19 NOTE — Patient Instructions (Signed)
It was a pleasure to see you today.  Continue current medications and follow-up for health maintenance exam in 6 months.  White blood cell count is completely normal.

## 2020-12-23 ENCOUNTER — Ambulatory Visit: Payer: 59

## 2021-01-02 ENCOUNTER — Encounter (HOSPITAL_BASED_OUTPATIENT_CLINIC_OR_DEPARTMENT_OTHER): Payer: Self-pay | Admitting: Obstetrics & Gynecology

## 2021-01-02 ENCOUNTER — Ambulatory Visit (INDEPENDENT_AMBULATORY_CARE_PROVIDER_SITE_OTHER): Payer: 59 | Admitting: Obstetrics & Gynecology

## 2021-01-02 ENCOUNTER — Other Ambulatory Visit: Payer: Self-pay | Admitting: Internal Medicine

## 2021-01-02 ENCOUNTER — Other Ambulatory Visit: Payer: Self-pay

## 2021-01-02 VITALS — BP 139/76 | HR 61 | Ht 61.0 in | Wt 131.0 lb

## 2021-01-02 DIAGNOSIS — Z01419 Encounter for gynecological examination (general) (routine) without abnormal findings: Secondary | ICD-10-CM | POA: Diagnosis not present

## 2021-01-02 DIAGNOSIS — Z780 Asymptomatic menopausal state: Secondary | ICD-10-CM

## 2021-01-02 NOTE — Progress Notes (Signed)
60 y.o. G36P2002 Married White or Caucasian female here for annual exam.  Doing well.  Denies vaginal bleeding.    Patient's last menstrual period was 07/22/2013 (exact date).          Sexually active: Yes.    The current method of family planning is post menopausal status.    Exercising: No.  Smoker:  yes  Health Maintenance: Pap:  11/2018, neg HR HPV History of abnormal Pap:  no MMG:  06/2020 Colonoscopy:  2015, follow up 10 year.  Dr. Laural Benes at [facility]. BMD:   10/19/2016, -1.3.  Plan to repeat next year TDaP:  06/20/2020 Pneumonia vaccine(s):  Not indicated Shingrix:   deferred Hep C testing: 2018 neg Screening Labs: 06/2020 done with Dr. Lenord Fellers   reports that she quit smoking about 34 years ago. Her smoking use included cigarettes. She has a 0.40 pack-year smoking history. She has never used smokeless tobacco. She reports current alcohol use. She reports that she does not use drugs.  Past Medical History:  Diagnosis Date  . GERD (gastroesophageal reflux disease)   . Hyperlipidemia   . Thyroid disease   . Vitamin D deficiency     Past Surgical History:  Procedure Laterality Date  . COLONOSCOPY WITH PROPOFOL N/A 07/24/2014   Procedure: COLONOSCOPY WITH PROPOFOL;  Surgeon: Charolett Bumpers, MD;  Location: [facility];  Service: Endoscopy;  Laterality: N/A;  . UPPER GASTROINTESTINAL ENDOSCOPY  08/2018    Current Outpatient Medications  Medication Sig Dispense Refill  . Cholecalciferol (VITAMIN D PO) Take by mouth.    . famotidine (PEPCID) 20 MG tablet Take 20 mg by mouth daily.    Marland Kitchen levothyroxine (SYNTHROID) 75 MCG tablet TAKE 1 TABLET(75 MCG) BY MOUTH DAILY 90 tablet 3  . meloxicam (MOBIC) 15 MG tablet Take 15 mg by mouth as needed for pain.    . simvastatin (ZOCOR) 20 MG tablet TAKE 1 TABLET(20 MG) BY MOUTH EVERY EVENING 90 tablet 3   No current facility-administered medications for this visit.    Family History  Problem Relation Age of Onset  . Heart disease Father      Review of Systems  All other systems reviewed and are negative.   Exam:   BP 139/76 (BP Location: Right Arm, Patient Position: Sitting, Cuff Size: Normal)   Pulse 61   Ht 5\' 1"  (1.549 m)   Wt 131 lb (59.4 kg)   LMP 07/22/2013 (Exact Date)   BMI 24.75 kg/m   Height: 5\' 1"  (154.9 cm)  General appearance: alert, cooperative and appears stated age Head: Normocephalic, without obvious abnormality, atraumatic Neck: no adenopathy, supple, symmetrical, trachea midline and thyroid normal to inspection and palpation Lungs: clear to auscultation bilaterally Breasts: normal appearance, no masses or tenderness Heart: regular rate and rhythm Abdomen: soft, non-tender; bowel sounds normal; no masses,  no organomegaly Extremities: extremities normal, atraumatic, no cyanosis or edema Skin: Skin color, texture, turgor normal. No rashes or lesions Lymph nodes: Cervical, supraclavicular, and axillary nodes normal. No abnormal inguinal nodes palpated Neurologic: Grossly normal   Pelvic: External genitalia:  no lesions              Urethra:  normal appearing urethra with no masses, tenderness or lesions              Bartholins and Skenes: normal                 Vagina: normal appearing vagina with normal color and discharge, no lesions  Cervix: no lesions              Pap taken: No. Bimanual Exam:  Uterus:  normal size, contour, position, consistency, mobility, non-tender              Adnexa: normal adnexa and no mass, fullness, tenderness               Rectovaginal: Confirms               Anus:  normal sphincter tone, no lesions  Chaperone, Charlsie Quest, CMA, was present for exam.  Assessment/Plan: 1. Well woman exam with routine gynecological exam - pap neg with neg HR HPV 11/2018.  Repeat next year. - MMG 06/2020 - Colonoscopy 2015.  Follow up 10 years.  Dr. Laural Benes at [facility] [facility] - BMD 10/2016, -1.3.  Repeat next year. - lab work done with Dr. Lenord Fellers - Vaccines up to date  2.  Postmenopausal - no HRT

## 2021-02-21 ENCOUNTER — Telehealth: Payer: Self-pay | Admitting: Internal Medicine

## 2021-02-21 NOTE — Telephone Encounter (Signed)
Home test neg

## 2021-02-21 NOTE — Telephone Encounter (Signed)
Scheduled

## 2021-02-21 NOTE — Telephone Encounter (Signed)
Christie Molina [id number]  [name] called to see if she can come in for a allergy shot, she stated she has gotten then here in the past. She is taking the [employer] version of Zetec now but it is not helping. When she goes outside she starts sneezing and blowing her nose and when the dog goes outside and comes back in and gets on her lap she starts sneezing and blowing her nose.

## 2021-02-21 NOTE — Telephone Encounter (Signed)
Needs negative home covid test and then can come in

## 2021-02-21 NOTE — Telephone Encounter (Signed)
She will do Home COVID test on Monday and call back.

## 2021-02-24 ENCOUNTER — Other Ambulatory Visit: Payer: Self-pay

## 2021-02-24 ENCOUNTER — Encounter: Payer: Self-pay | Admitting: Internal Medicine

## 2021-02-24 ENCOUNTER — Ambulatory Visit: Payer: 59 | Admitting: Internal Medicine

## 2021-02-24 VITALS — HR 66 | Temp 98.4°F | Ht 61.0 in | Wt 133.0 lb

## 2021-02-24 DIAGNOSIS — J0100 Acute maxillary sinusitis, unspecified: Secondary | ICD-10-CM

## 2021-02-24 DIAGNOSIS — J309 Allergic rhinitis, unspecified: Secondary | ICD-10-CM

## 2021-02-24 MED ORDER — METHYLPREDNISOLONE ACETATE 80 MG/ML IJ SUSP
80.0000 mg | Freq: Once | INTRAMUSCULAR | Status: AC
  Administered 2021-02-24: 80 mg via INTRAMUSCULAR

## 2021-02-24 MED ORDER — ALPRAZOLAM 0.25 MG PO TABS: 0.2500 mg | ORAL_TABLET | Freq: Two times a day (BID) | ORAL | 1 refills | Status: DC | PRN

## 2021-02-24 MED ORDER — AZITHROMYCIN 250 MG PO TABS: ORAL_TABLET | ORAL | 0 refills | Status: AC

## 2021-02-24 NOTE — Patient Instructions (Addendum)
Depomedrol 80 mg IM.  Prescribed Zithromax Z pak to take 2 tabs day 1 followed by one tab days 2-5.  Call if symptoms fail to improve in the next week to 10 days.

## 2021-02-24 NOTE — Progress Notes (Signed)
   Subjective:    Patient ID: Christie Molina, female    DOB: 1961/08/24, 60 y.o.   MRN: [id number]  HPI 60 year old Female seen today with exacerbation of allergic rhinitis.  Has been taking the [employer] generic Zyrtec but it is not helping.  When she goes outside she starts sneezing and having rhinorrhea.  When her dog goes outside comes back and she sneezes and has rhinorrhea.  Had similar issues during springtime previously.  Depo-Medrol generally works well for her.  She would like to get this today.  Was treated June 2020 for similar issues and received IM Depo-Medrol at that time.    Review of Systems see above no fever chills, nausea or vomiting.  Had negative home COVID test today.  Complaining of thick white nasal discharge.     Objective:   Physical Exam Pulse 66 temperature 98.4 degrees pulse oximetry 98% weight 133 pounds height 5 feet 1 inches BMI 25.13 Skin is warm and dry.  No cervical adenopathy.  TMs are clear.  Pharynx is clear.  Neck is supple.  Chest clear to auscultation without rales or wheezing.      Assessment & Plan:  Longstanding history of allergic rhinitis  Maxillary sinusitis  Acute exacerbation of allergic rhinitis  History of GE reflux treated with Pepcid  Plan: Patient given Depo-Medrol 80 mg IM and started on Zithromax Z-PAK 2 tabs day 1 followed by 1 tab days 2 through 5.

## 2021-06-19 ENCOUNTER — Other Ambulatory Visit: Payer: 59 | Admitting: Internal Medicine

## 2021-06-19 ENCOUNTER — Other Ambulatory Visit: Payer: Self-pay

## 2021-06-19 DIAGNOSIS — Z0000 Encounter for general adult medical examination without abnormal findings: Secondary | ICD-10-CM

## 2021-06-19 DIAGNOSIS — E039 Hypothyroidism, unspecified: Secondary | ICD-10-CM

## 2021-06-19 DIAGNOSIS — E7800 Pure hypercholesterolemia, unspecified: Secondary | ICD-10-CM

## 2021-06-19 LAB — COMPLETE METABOLIC PANEL WITH GFR
AG Ratio: 1.9 (calc) (ref 1.0–2.5)
ALT: 18 U/L (ref 6–29)
AST: 22 U/L (ref 10–35)
Albumin: 4.4 g/dL (ref 3.6–5.1)
Alkaline phosphatase (APISO): 54 U/L (ref 37–153)
BUN: 10 mg/dL (ref 7–25)
CO2: 30 mmol/L (ref 20–32)
Calcium: 9.7 mg/dL (ref 8.6–10.4)
Chloride: 102 mmol/L (ref 98–110)
Creat: 0.89 mg/dL (ref 0.50–1.05)
Globulin: 2.3 g/dL (calc) (ref 1.9–3.7)
Glucose, Bld: 89 mg/dL (ref 65–99)
Potassium: 4.8 mmol/L (ref 3.5–5.3)
Sodium: 139 mmol/L (ref 135–146)
Total Bilirubin: 0.6 mg/dL (ref 0.2–1.2)
Total Protein: 6.7 g/dL (ref 6.1–8.1)
eGFR: 74 mL/min/{1.73_m2} (ref >=60)

## 2021-06-19 LAB — CBC WITH DIFFERENTIAL/PLATELET
Absolute Monocytes: 429 cells/uL (ref 200–950)
Basophils Absolute: 59 cells/uL (ref 0–200)
Basophils Relative: 1.5 %
Eosinophils Absolute: 90 cells/uL (ref 15–500)
Eosinophils Relative: 2.3 %
HCT: 40.1 % (ref 35.0–45.0)
Hemoglobin: 13.7 g/dL (ref 11.7–15.5)
Lymphs Abs: 1482 cells/uL (ref 850–3900)
MCH: 30.2 pg (ref 27.0–33.0)
MCHC: 34.2 g/dL (ref 32.0–36.0)
MCV: 88.5 fL (ref 80.0–100.0)
MPV: 10.2 fL (ref 7.5–12.5)
Monocytes Relative: 11 %
Neutro Abs: 1841 cells/uL (ref 1500–7800)
Neutrophils Relative %: 47.2 %
Platelets: 219 10*3/uL (ref 140–400)
RBC: 4.53 10*6/uL (ref 3.80–5.10)
RDW: 12.5 % (ref 11.0–15.0)
Total Lymphocyte: 38 %
WBC: 3.9 10*3/uL (ref 3.8–10.8)

## 2021-06-19 LAB — LIPID PANEL
Cholesterol: 236 mg/dL — ABNORMAL HIGH (ref <200)
HDL: 89 mg/dL (ref >=50)
LDL Cholesterol (Calc): 126 mg/dL (calc) — ABNORMAL HIGH
Non-HDL Cholesterol (Calc): 147 mg/dL (calc) — ABNORMAL HIGH (ref <130)
Total CHOL/HDL Ratio: 2.7 (calc) (ref <5.0)
Triglycerides: 99 mg/dL (ref <150)

## 2021-06-19 LAB — TSH: TSH: 1.01 mIU/L (ref 0.40–4.50)

## 2021-06-23 ENCOUNTER — Other Ambulatory Visit: Payer: Self-pay

## 2021-06-23 ENCOUNTER — Encounter: Payer: Self-pay | Admitting: Internal Medicine

## 2021-06-23 ENCOUNTER — Ambulatory Visit (INDEPENDENT_AMBULATORY_CARE_PROVIDER_SITE_OTHER): Payer: 59 | Admitting: Internal Medicine

## 2021-06-23 VITALS — BP 116/70 | HR 74 | Resp 18 | Ht 61.0 in | Wt 128.0 lb

## 2021-06-23 DIAGNOSIS — Z23 Encounter for immunization: Secondary | ICD-10-CM

## 2021-06-23 DIAGNOSIS — J309 Allergic rhinitis, unspecified: Secondary | ICD-10-CM

## 2021-06-23 DIAGNOSIS — E039 Hypothyroidism, unspecified: Secondary | ICD-10-CM | POA: Diagnosis not present

## 2021-06-23 DIAGNOSIS — K219 Gastro-esophageal reflux disease without esophagitis: Secondary | ICD-10-CM | POA: Diagnosis not present

## 2021-06-23 DIAGNOSIS — Z0000 Encounter for general adult medical examination without abnormal findings: Secondary | ICD-10-CM | POA: Diagnosis not present

## 2021-06-23 DIAGNOSIS — E7800 Pure hypercholesterolemia, unspecified: Secondary | ICD-10-CM

## 2021-06-23 DIAGNOSIS — Z8659 Personal history of other mental and behavioral disorders: Secondary | ICD-10-CM

## 2021-06-23 LAB — POCT URINALYSIS DIPSTICK (MANUAL)
Leukocytes, UA: NEGATIVE
Nitrite, UA: NEGATIVE
Poct Bilirubin: NEGATIVE
Poct Blood: NEGATIVE
Poct Glucose: NORMAL mg/dL
Poct Ketones: NEGATIVE
Poct Protein: NEGATIVE mg/dL
Poct Urobilinogen: NORMAL mg/dL
Spec Grav, UA: >=1.03 — AB (ref 1.010–1.025)
pH, UA: 5 (ref 5.0–8.0)
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
## 2021-06-23 NOTE — Patient Instructions (Addendum)
Flu vaccine given. Take lipid med regularly and RTC  in March for lipid panel only without OV. Continue current meds as prescribed.  Try to get more exercise and watch her diet.  Continue same dose of thyroid replacement.  Continue medication for GE reflux and Xanax sparingly for anxiety.  It was a pleasure to see you today.

## 2021-07-11 ENCOUNTER — Encounter: Payer: Self-pay | Admitting: Internal Medicine

## 2021-09-07 ENCOUNTER — Other Ambulatory Visit: Payer: Self-pay | Admitting: Internal Medicine

## 2021-12-08 ENCOUNTER — Other Ambulatory Visit: Payer: Self-pay

## 2021-12-08 ENCOUNTER — Other Ambulatory Visit: Payer: 59 | Admitting: Internal Medicine

## 2021-12-08 DIAGNOSIS — E7800 Pure hypercholesterolemia, unspecified: Secondary | ICD-10-CM

## 2021-12-08 LAB — LIPID PANEL
Cholesterol: 206 mg/dL — ABNORMAL HIGH (ref <200)
HDL: 84 mg/dL (ref >=50)
LDL Cholesterol (Calc): 106 mg/dL (calc) — ABNORMAL HIGH
Non-HDL Cholesterol (Calc): 122 mg/dL (calc) (ref <130)
Total CHOL/HDL Ratio: 2.5 (calc) (ref <5.0)
Triglycerides: 69 mg/dL (ref <150)

## 2022-01-08 ENCOUNTER — Encounter (HOSPITAL_BASED_OUTPATIENT_CLINIC_OR_DEPARTMENT_OTHER): Payer: Self-pay | Admitting: Obstetrics & Gynecology

## 2022-01-08 ENCOUNTER — Other Ambulatory Visit (HOSPITAL_COMMUNITY)
Admission: RE | Admit: 2022-01-08 | Discharge: 2022-01-08 | Disposition: A | Discharge status: Home / Self Care | Payer: 59 | Source: Ambulatory Visit | Attending: Obstetrics & Gynecology | Admitting: Obstetrics & Gynecology

## 2022-01-08 ENCOUNTER — Ambulatory Visit (INDEPENDENT_AMBULATORY_CARE_PROVIDER_SITE_OTHER): Payer: 59 | Admitting: Obstetrics & Gynecology

## 2022-01-08 VITALS — BP 117/65 | HR 60 | Ht 61.0 in | Wt 136.4 lb

## 2022-01-08 DIAGNOSIS — Z01419 Encounter for gynecological examination (general) (routine) without abnormal findings: Secondary | ICD-10-CM

## 2022-01-08 DIAGNOSIS — Z124 Encounter for screening for malignant neoplasm of cervix: Secondary | ICD-10-CM

## 2022-01-08 DIAGNOSIS — Z780 Asymptomatic menopausal state: Secondary | ICD-10-CM | POA: Diagnosis not present

## 2022-01-08 DIAGNOSIS — M8580 Other specified disorders of bone density and structure, unspecified site: Secondary | ICD-10-CM

## 2022-01-08 NOTE — Progress Notes (Signed)
61 y.o. G76P2002 Married White or Caucasian female here for annual exam.  Denies vaginal bleeding.  Sees Dr. Renold Genta.  Lipids were elevated in 06/2021.  Follow up was better in early March.   ? ?Patient's last menstrual period was 07/22/2013 (exact date).          ?Sexually active: not much ?The current method of family planning is post menopausal status.    ?Smoker:  no ? ?Health Maintenance: ?Pap:  11/18/2018 Negative, neg with neg HR HPV ?History of abnormal Pap:  no ?MMG:  07/11/2021 Negative ?Colonoscopy:  2015, follow up 2 years, [facility] ?BMD:   10/19/2016, order placed ?Screening Labs: 06/2021 ? ? reports that she quit smoking about 35 years ago. Her smoking use included cigarettes. She has a 0.40 pack-year smoking history. She has never used smokeless tobacco. She reports current alcohol use. She reports that she does not use drugs. ? ?Past Medical History:  ?Diagnosis Date  ? GERD (gastroesophageal reflux disease)   ? Hyperlipidemia   ? Thyroid disease   ? Vitamin D deficiency   ? ? ?Past Surgical History:  ?Procedure Laterality Date  ? COLONOSCOPY WITH PROPOFOL N/A 07/24/2014  ? Procedure: COLONOSCOPY WITH PROPOFOL;  Surgeon: Garlan Fair, MD;  Location: [facility];  Service: Endoscopy;  Laterality: N/A;  ? UPPER GASTROINTESTINAL ENDOSCOPY  08/2018  ? ? ?Current Outpatient Medications  ?Medication Sig Dispense Refill  ? ALPRAZolam (XANAX) 0.25 MG tablet Take 1 tablet (0.25 mg total) by mouth 2 (two) times daily as needed for anxiety. 30 tablet 1  ? Cholecalciferol (VITAMIN D PO) Take by mouth.    ? famotidine (PEPCID) 20 MG tablet Take 20 mg by mouth daily.    ? levothyroxine (SYNTHROID) 75 MCG tablet TAKE 1 TABLET(75 MCG) BY MOUTH DAILY 90 tablet 3  ? simvastatin (ZOCOR) 20 MG tablet TAKE 1 TABLET(20 MG) BY MOUTH EVERY EVENING 90 tablet 3  ? ?No current facility-administered medications for this visit.  ? ? ?Family History  ?Problem Relation Age of Onset  ? Heart disease Father   ? ? ?Review of Systems   ?All other systems reviewed and are negative. ? ?Exam:   ?BP 117/65 (BP Location: Right Arm, Patient Position: Sitting, Cuff Size: Normal)   Pulse 60   Ht 5\' 1"  (1.549 m) Comment: reported  Wt 136 lb 6.4 oz (61.9 kg)   LMP 07/22/2013 (Exact Date)   BMI 25.77 kg/m?   Height: 5\' 1"  (154.9 cm) (reported) ? ?General appearance: alert, cooperative and appears stated age ?Head: Normocephalic, without obvious abnormality, atraumatic ?Neck: no adenopathy, supple, symmetrical, trachea midline and thyroid normal to inspection and palpation ?Lungs: clear to auscultation bilaterally ?Breasts: normal appearance, no masses or tenderness ?Heart: regular rate and rhythm ?Abdomen: soft, non-tender; bowel sounds normal; no masses,  no organomegaly ?Extremities: extremities normal, atraumatic, no cyanosis or edema ?Skin: Skin color, texture, turgor normal. No rashes or lesions ?Lymph nodes: Cervical, supraclavicular, and axillary nodes normal. ?No abnormal inguinal nodes palpated ?Neurologic: Grossly normal ? ? ?Pelvic: External genitalia:  no lesions ?             Urethra:  normal appearing urethra with no masses, tenderness or lesions ?             Bartholins and Skenes: normal    ?             Vagina: normal appearing vagina with normal color and no discharge, no lesions ?  Cervix: no lesions ?             Pap taken: Yes.   ?Bimanual Exam:  Uterus:  normal size, contour, position, consistency, mobility, non-tender ?             Adnexa: normal adnexa and no mass, fullness, tenderness ?              Rectovaginal: Confirms ?              Anus:  normal sphincter tone, no lesions ? ?Chaperone, Octaviano Batty, CMA, was present for exam. ? ?Assessment/Plan: ?1. Well woman exam with routine gynecological exam ?- pap and HR HPV obtained today ?- MMG 07/2021 ?- BMD ordered ?- colonoscopy 2015 ?- lab work done with Dr. Renold Genta ?- vaccines reviewed/updated ? ?2. Postmenopausal ?- no HRT ? ?3. Osteopenia, unspecified location ?-  DG BONE DENSITY (DXA); Future ? ?4. Cervical cancer screening ?- Cytology - PAP( [facility]) ? ? ?

## 2022-01-09 ENCOUNTER — Other Ambulatory Visit: Payer: Self-pay | Admitting: Internal Medicine

## 2022-01-13 LAB — CYTOLOGY - PAP
Adequacy: ABSENT
Comment: NEGATIVE
Diagnosis: NEGATIVE
High risk HPV: NEGATIVE

## 2022-02-02 ENCOUNTER — Telehealth: Payer: Self-pay

## 2022-02-02 NOTE — Telephone Encounter (Signed)
Appt made

## 2022-02-02 NOTE — Telephone Encounter (Signed)
Patient states that she is having some side discomfort and sometime som back pain. She denies dysuria, urinary frequency. She thinks it may have something to do with digestion. Please advise. She would like an appt.  ?

## 2022-02-02 NOTE — Telephone Encounter (Signed)
Left message for patient to return call to office at [phone].  

## 2022-02-03 ENCOUNTER — Ambulatory Visit
Admission: RE | Admit: 2022-02-03 | Discharge: 2022-02-03 | Disposition: A | Discharge status: Home / Self Care | Payer: 59 | Source: Ambulatory Visit | Attending: Internal Medicine | Admitting: Internal Medicine

## 2022-02-03 ENCOUNTER — Encounter: Payer: Self-pay | Admitting: Internal Medicine

## 2022-02-03 ENCOUNTER — Telehealth: Payer: Self-pay

## 2022-02-03 ENCOUNTER — Ambulatory Visit: Payer: 59 | Admitting: Internal Medicine

## 2022-02-03 VITALS — BP 128/80 | HR 61 | Temp 97.4°F | Ht 61.0 in | Wt 136.0 lb

## 2022-02-03 DIAGNOSIS — K5901 Slow transit constipation: Secondary | ICD-10-CM

## 2022-02-03 DIAGNOSIS — R109 Unspecified abdominal pain: Secondary | ICD-10-CM | POA: Diagnosis not present

## 2022-02-03 DIAGNOSIS — R1012 Left upper quadrant pain: Secondary | ICD-10-CM

## 2022-02-03 LAB — POCT URINALYSIS DIPSTICK
Bilirubin, UA: NEGATIVE
Glucose, UA: NEGATIVE
Ketones, UA: NEGATIVE
Leukocytes, UA: NEGATIVE
Nitrite, UA: NEGATIVE
Protein, UA: NEGATIVE
Spec Grav, UA: <=1.005 — AB (ref 1.010–1.025)
Urobilinogen, UA: 0.2 E.U./dL
pH, UA: 6.5 (ref 5.0–8.0)

## 2022-02-03 NOTE — Progress Notes (Signed)
   Subjective:    Patient ID: Christie Molina, female    DOB: 1961-07-30, 61 y.o.   MRN: [id number]  HPI 61 year old Female seen with LUQ pain.  About 3 weeks ago, discomfort started. Hx constipation. Does not have BM daily. Can be one BM every 2-3 days.  Dr. Hyacinth Meeker has recently suggested Colace.  Review of Systems  Patient had colonoscopy  2015 with 10 year follow up recommended.     Objective:   Physical Exam   BP 128/80 pulse 61 Pulse ox 99 % weight 136 pounds.  Currently in no acute distress.  Abdomen is soft nondistended without hepatosplenomegaly or masses.  There is some mild left upper quadrant tenderness on palpation.  KUB abdominal 1 view x-ray shows a moderate amount of stool in the colon including the region of the splenic flexure where there is mild gaseous distention of the colon as well.  No dilated small bowel loops are seen to suggest obstruction.  Urine dipstick is essentially normal except for low specific gravity likely due to overhydration.        Assessment & Plan:  My feeling is that her left upper quadrant pain is likely due to constipation.  Plan: I have recommended that she take 2 Dulcolax tablets this evening to treat the constipation and then start taking MiraLAX daily.  Urine dipstick is normal.

## 2022-02-03 NOTE — Telephone Encounter (Signed)
[facility] called with xray report of abdomen. ? ?Moderate colonic stool burden ?

## 2022-02-21 NOTE — Patient Instructions (Signed)
Take 2 Dulcolax tablets today to relieve constipation and then begin MiraLAX daily.

## 2022-02-23 ENCOUNTER — Encounter: Payer: Self-pay | Admitting: Internal Medicine

## 2022-02-23 ENCOUNTER — Telehealth: Payer: Self-pay

## 2022-02-23 ENCOUNTER — Ambulatory Visit (INDEPENDENT_AMBULATORY_CARE_PROVIDER_SITE_OTHER): Payer: 59 | Admitting: Internal Medicine

## 2022-02-23 VITALS — BP 120/72 | HR 72 | Temp 98.3°F

## 2022-02-23 DIAGNOSIS — J309 Allergic rhinitis, unspecified: Secondary | ICD-10-CM

## 2022-02-23 DIAGNOSIS — H6503 Acute serous otitis media, bilateral: Secondary | ICD-10-CM | POA: Diagnosis not present

## 2022-02-23 DIAGNOSIS — T7840XA Allergy, unspecified, initial encounter: Secondary | ICD-10-CM

## 2022-02-23 MED ORDER — METHYLPREDNISOLONE ACETATE 80 MG/ML IJ SUSP
80.0000 mg | Freq: Once | INTRAMUSCULAR | Status: AC
  Administered 2022-02-23: 80 mg via INTRAMUSCULAR

## 2022-02-23 NOTE — Patient Instructions (Addendum)
Please use Flonase nasal spray 2 sprays in each nostril daily through allergy season.  Depo-Medrol 80 mg IM given in office today.  Okay to continue generic Zyrtec.

## 2022-02-23 NOTE — Progress Notes (Signed)
   Subjective:    Patient ID: Christie Molina, female    DOB: March 08, 1961, 61 y.o.   MRN: [id number]  HPI 61 year old Female with history of allergic rhinitis presents with complaint of nasal congestion and sneezing as well as head congestion.  She stepped out of her home today and had significant allergy type symptoms with congestion and sneezing.  She takes Zyrtec (generic) daily during allergy season.  She has not seen an allergist.  Interestingly, had similar symptoms for which she was seen Feb 24, 2021.  Depo-Medrol generally works well for her.  In June 2020 she was treated for similar issues and received Depo-Medrol with relief.  In 2022 she received Depo-Medrol with relief.  Does not have symptoms of sinusitis.  She has a longstanding history of GE reflux, allergic rhinitis, hyperlipidemia,and hypothyroidism.  No known drug allergies    Review of Systems no fever or chills.  No significant headache.  Just nasal congestion.  No wheezing.     Objective:   Physical Exam  Blood pressure 120/72, pulse 72 regular, temperature 98.3 degrees, pulse oximetry 98% Skin warm and dry.  Both TMs are full bilaterally with splayed light reflex.  TMs are not red.  Pharynx is without exudate.  Neck is supple.  Chest is clear to auscultation without rales or wheezing.  No rhinorrhea.       Assessment & Plan:  Allergic rhinitis exacerbation  Bilateral serous otitis media  Plan: Depo-Medrol 80 mg IM.  Okay to continue with generic Zyrtec for allergy symptoms as needed.  Patient declines offer for Allergy referral.  Recommend Flonase nasal spray 2 sprays each nostril once daily.

## 2022-02-23 NOTE — Telephone Encounter (Signed)
Left message to return call to our office.  

## 2022-02-23 NOTE — Telephone Encounter (Signed)
Patient called wanted to schedule allergy shot. Informed will call back with time and date.

## 2022-02-23 NOTE — Telephone Encounter (Signed)
Patient called back appointment made for today at 230pm

## 2022-05-21 ENCOUNTER — Encounter: Payer: Self-pay | Admitting: Internal Medicine

## 2022-05-21 ENCOUNTER — Ambulatory Visit: Payer: 59 | Admitting: Internal Medicine

## 2022-05-21 VITALS — BP 122/80 | HR 61 | Temp 98.1°F | Wt 138.1 lb

## 2022-05-21 DIAGNOSIS — J309 Allergic rhinitis, unspecified: Secondary | ICD-10-CM | POA: Diagnosis not present

## 2022-05-21 DIAGNOSIS — K580 Irritable bowel syndrome with diarrhea: Secondary | ICD-10-CM | POA: Diagnosis not present

## 2022-05-21 DIAGNOSIS — Z8719 Personal history of other diseases of the digestive system: Secondary | ICD-10-CM

## 2022-05-21 DIAGNOSIS — K219 Gastro-esophageal reflux disease without esophagitis: Secondary | ICD-10-CM

## 2022-05-21 DIAGNOSIS — E039 Hypothyroidism, unspecified: Secondary | ICD-10-CM

## 2022-05-21 DIAGNOSIS — E7800 Pure hypercholesterolemia, unspecified: Secondary | ICD-10-CM | POA: Diagnosis not present

## 2022-05-21 DIAGNOSIS — Z8659 Personal history of other mental and behavioral disorders: Secondary | ICD-10-CM

## 2022-05-21 MED ORDER — CILIDINIUM-CHLORDIAZEPOXIDE 2.5-5 MG PO CAPS: ORAL_CAPSULE | ORAL | 3 refills | Status: DC

## 2022-05-21 MED ORDER — HYOSCYAMINE SULFATE SL 0.125 MG SL SUBL: 1.0000 | SUBLINGUAL_TABLET | Freq: Three times a day (TID) | SUBLINGUAL | 1 refills | Status: DC | PRN

## 2022-05-21 NOTE — Progress Notes (Signed)
   Subjective:    Patient ID: Christie Molina, female    DOB: 1961-06-16, 61 y.o.   MRN: [id number]  HPI    Review of Systems     Objective:   Physical Exam        Assessment & Plan:

## 2022-05-21 NOTE — Progress Notes (Signed)
   Subjective:    Patient ID: Christie Molina, female    DOB: 12-28-1960, 61 y.o.   MRN: [id number]  HPI 61 year old Female seen for issues with loose stools and diarrhea often at inconvenient times. In May had issue with constipation treated with Colace. H. Pylori breath test 2019 was negative.  Denies significant stress. No blood in BM. No melena.  She has a history of hypothyroidism, allergic rhinitis, GE reflux.  She takes Zocor for hyperlipidemia and thyroid replacement medication for hypothyroidism.  Social history: She has 2 daughters.  She is married.  Non-smoker.  Family history: Remarkable for dementia in mother and aunt.  Father died of congestive heart failure.  History of anxiety and sometimes takes Xanax if needed.  Review of Systems had colonoscopy 2015 with 10 year follow up recommended. Had upper endoscopy July 2020 with multiple polyps noted in gastric fundus     Objective:   Physical Exam   BP 122/80 pulse 61 T 98.1 degrees pulse ox 98% weight 138 lb 1.9 oz BMI 26.10 Abdomen: Soft nondistended without hepatosplenomegaly masses or tenderness.     Assessment & Plan:  Probable irritable bowel syndrome.  Trial of Levsin 0.125 mg under tongue up to 3 times daily as needed.  Handout given on irritable bowel syndrome.  Hyperlipidemia treated with statin and stable  Hypothyroidism treated with thyroid replacement medication  GE reflux treated with PPI.  History of anxiety treated with as needed Xanax    Plan: Handout given on irritable bowel syndrome.  Trial of Levsin 0.125 mg up to 3 times daily before meals.  Patient to let me know if not improving within a couple of weeks.

## 2022-06-18 NOTE — Patient Instructions (Signed)
Trial of Levsin 0.125 mg sublingual up to 3 times daily before meals as needed.  Handout given on irritable bowel syndrome.

## 2022-06-22 ENCOUNTER — Other Ambulatory Visit: Payer: 59

## 2022-06-22 DIAGNOSIS — Z8659 Personal history of other mental and behavioral disorders: Secondary | ICD-10-CM

## 2022-06-22 DIAGNOSIS — E039 Hypothyroidism, unspecified: Secondary | ICD-10-CM

## 2022-06-22 DIAGNOSIS — E7800 Pure hypercholesterolemia, unspecified: Secondary | ICD-10-CM

## 2022-06-23 LAB — LIPID PANEL
Cholesterol: 229 mg/dL — ABNORMAL HIGH (ref <200)
HDL: 89 mg/dL (ref >=50)
LDL Cholesterol (Calc): 124 mg/dL (calc) — ABNORMAL HIGH
Non-HDL Cholesterol (Calc): 140 mg/dL (calc) — ABNORMAL HIGH (ref <130)
Total CHOL/HDL Ratio: 2.6 (calc) (ref <5.0)
Triglycerides: 64 mg/dL (ref <150)

## 2022-06-23 LAB — CBC WITH DIFFERENTIAL/PLATELET
Absolute Monocytes: 426 cells/uL (ref 200–950)
Basophils Absolute: 62 cells/uL (ref 0–200)
Basophils Relative: 1.5 %
Eosinophils Absolute: 90 cells/uL (ref 15–500)
Eosinophils Relative: 2.2 %
HCT: 39.3 % (ref 35.0–45.0)
Hemoglobin: 13.3 g/dL (ref 11.7–15.5)
Lymphs Abs: 1378 cells/uL (ref 850–3900)
MCH: 30.5 pg (ref 27.0–33.0)
MCHC: 33.8 g/dL (ref 32.0–36.0)
MCV: 90.1 fL (ref 80.0–100.0)
MPV: 10.4 fL (ref 7.5–12.5)
Monocytes Relative: 10.4 %
Neutro Abs: 2144 cells/uL (ref 1500–7800)
Neutrophils Relative %: 52.3 %
Platelets: 213 10*3/uL (ref 140–400)
RBC: 4.36 10*6/uL (ref 3.80–5.10)
RDW: 12.5 % (ref 11.0–15.0)
Total Lymphocyte: 33.6 %
WBC: 4.1 10*3/uL (ref 3.8–10.8)

## 2022-06-23 LAB — COMPLETE METABOLIC PANEL WITH GFR
AG Ratio: 1.8 (calc) (ref 1.0–2.5)
ALT: 13 U/L (ref 6–29)
AST: 23 U/L (ref 10–35)
Albumin: 4.4 g/dL (ref 3.6–5.1)
Alkaline phosphatase (APISO): 56 U/L (ref 37–153)
BUN: 11 mg/dL (ref 7–25)
CO2: 30 mmol/L (ref 20–32)
Calcium: 9.8 mg/dL (ref 8.6–10.4)
Chloride: 103 mmol/L (ref 98–110)
Creat: 0.77 mg/dL (ref 0.50–1.05)
Globulin: 2.5 g/dL (calc) (ref 1.9–3.7)
Glucose, Bld: 88 mg/dL (ref 65–99)
Potassium: 4.6 mmol/L (ref 3.5–5.3)
Sodium: 141 mmol/L (ref 135–146)
Total Bilirubin: 0.6 mg/dL (ref 0.2–1.2)
Total Protein: 6.9 g/dL (ref 6.1–8.1)
eGFR: 87 mL/min/{1.73_m2} (ref >=60)

## 2022-06-23 LAB — TSH: TSH: 1.05 mIU/L (ref 0.40–4.50)

## 2022-06-25 ENCOUNTER — Ambulatory Visit (INDEPENDENT_AMBULATORY_CARE_PROVIDER_SITE_OTHER): Payer: 59 | Admitting: Internal Medicine

## 2022-06-25 ENCOUNTER — Encounter: Payer: Self-pay | Admitting: Internal Medicine

## 2022-06-25 VITALS — BP 122/72 | HR 60 | Temp 97.8°F | Ht 61.25 in | Wt 139.8 lb

## 2022-06-25 DIAGNOSIS — Z8249 Family history of ischemic heart disease and other diseases of the circulatory system: Secondary | ICD-10-CM

## 2022-06-25 DIAGNOSIS — K219 Gastro-esophageal reflux disease without esophagitis: Secondary | ICD-10-CM

## 2022-06-25 DIAGNOSIS — Z8659 Personal history of other mental and behavioral disorders: Secondary | ICD-10-CM

## 2022-06-25 DIAGNOSIS — E039 Hypothyroidism, unspecified: Secondary | ICD-10-CM

## 2022-06-25 DIAGNOSIS — K580 Irritable bowel syndrome with diarrhea: Secondary | ICD-10-CM | POA: Diagnosis not present

## 2022-06-25 DIAGNOSIS — Z23 Encounter for immunization: Secondary | ICD-10-CM | POA: Diagnosis not present

## 2022-06-25 DIAGNOSIS — E7800 Pure hypercholesterolemia, unspecified: Secondary | ICD-10-CM

## 2022-06-25 DIAGNOSIS — J309 Allergic rhinitis, unspecified: Secondary | ICD-10-CM

## 2022-06-25 DIAGNOSIS — Z0000 Encounter for general adult medical examination without abnormal findings: Secondary | ICD-10-CM

## 2022-06-25 LAB — POCT URINALYSIS DIPSTICK
Bilirubin, UA: NEGATIVE
Glucose, UA: NEGATIVE
Ketones, UA: NEGATIVE
Leukocytes, UA: NEGATIVE
Nitrite, UA: NEGATIVE
Protein, UA: NEGATIVE
Spec Grav, UA: 1.015 (ref 1.010–1.025)
Urobilinogen, UA: 0.2 E.U./dL
pH, UA: 5 (ref 5.0–8.0)

## 2022-06-25 NOTE — Patient Instructions (Addendum)
Flu vaccine given. Covid vaccine suggested. Labs reviewed. Will take cholesterol lowering medication more consistently. RTC in 6 months. Have mammogram and bone density.  It was a pleasure to see you today.  Coronary calcium scoring ordered.
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
## 2022-07-24 LAB — HM DEXA SCAN

## 2022-07-24 LAB — HM MAMMOGRAPHY

## 2022-07-27 ENCOUNTER — Encounter: Payer: Self-pay | Admitting: Internal Medicine

## 2022-07-28 ENCOUNTER — Encounter: Payer: Self-pay | Admitting: Internal Medicine

## 2022-07-31 ENCOUNTER — Encounter (HOSPITAL_BASED_OUTPATIENT_CLINIC_OR_DEPARTMENT_OTHER): Payer: Self-pay | Admitting: *Deleted

## 2022-07-31 DIAGNOSIS — M8580 Other specified disorders of bone density and structure, unspecified site: Secondary | ICD-10-CM

## 2022-08-06 ENCOUNTER — Other Ambulatory Visit (HOSPITAL_COMMUNITY): Payer: 59

## 2022-08-10 LAB — HM MAMMOGRAPHY

## 2022-08-11 ENCOUNTER — Encounter: Payer: Self-pay | Admitting: Internal Medicine

## 2022-08-18 ENCOUNTER — Ambulatory Visit (HOSPITAL_COMMUNITY)
Admission: RE | Admit: 2022-08-18 | Discharge: 2022-08-18 | Disposition: A | Discharge status: Home / Self Care | Payer: 59 | Source: Ambulatory Visit | Attending: Internal Medicine | Admitting: Internal Medicine

## 2022-08-18 DIAGNOSIS — Z8249 Family history of ischemic heart disease and other diseases of the circulatory system: Secondary | ICD-10-CM | POA: Insufficient documentation

## 2022-09-03 ENCOUNTER — Other Ambulatory Visit: Payer: Self-pay

## 2022-09-03 MED ORDER — LEVOTHYROXINE SODIUM 75 MCG PO TABS: ORAL_TABLET | ORAL | 3 refills | Status: DC

## 2022-10-10 IMAGING — CR DG ABDOMEN 1V
1 series · 1 of 1 positions shown · non-contrast
Comparison: None Available.

CLINICAL DATA: Left upper quadrant abdominal pain.  Constipation.

EXAM:
ABDOMEN - 1 VIEW

[t abdomen supine]
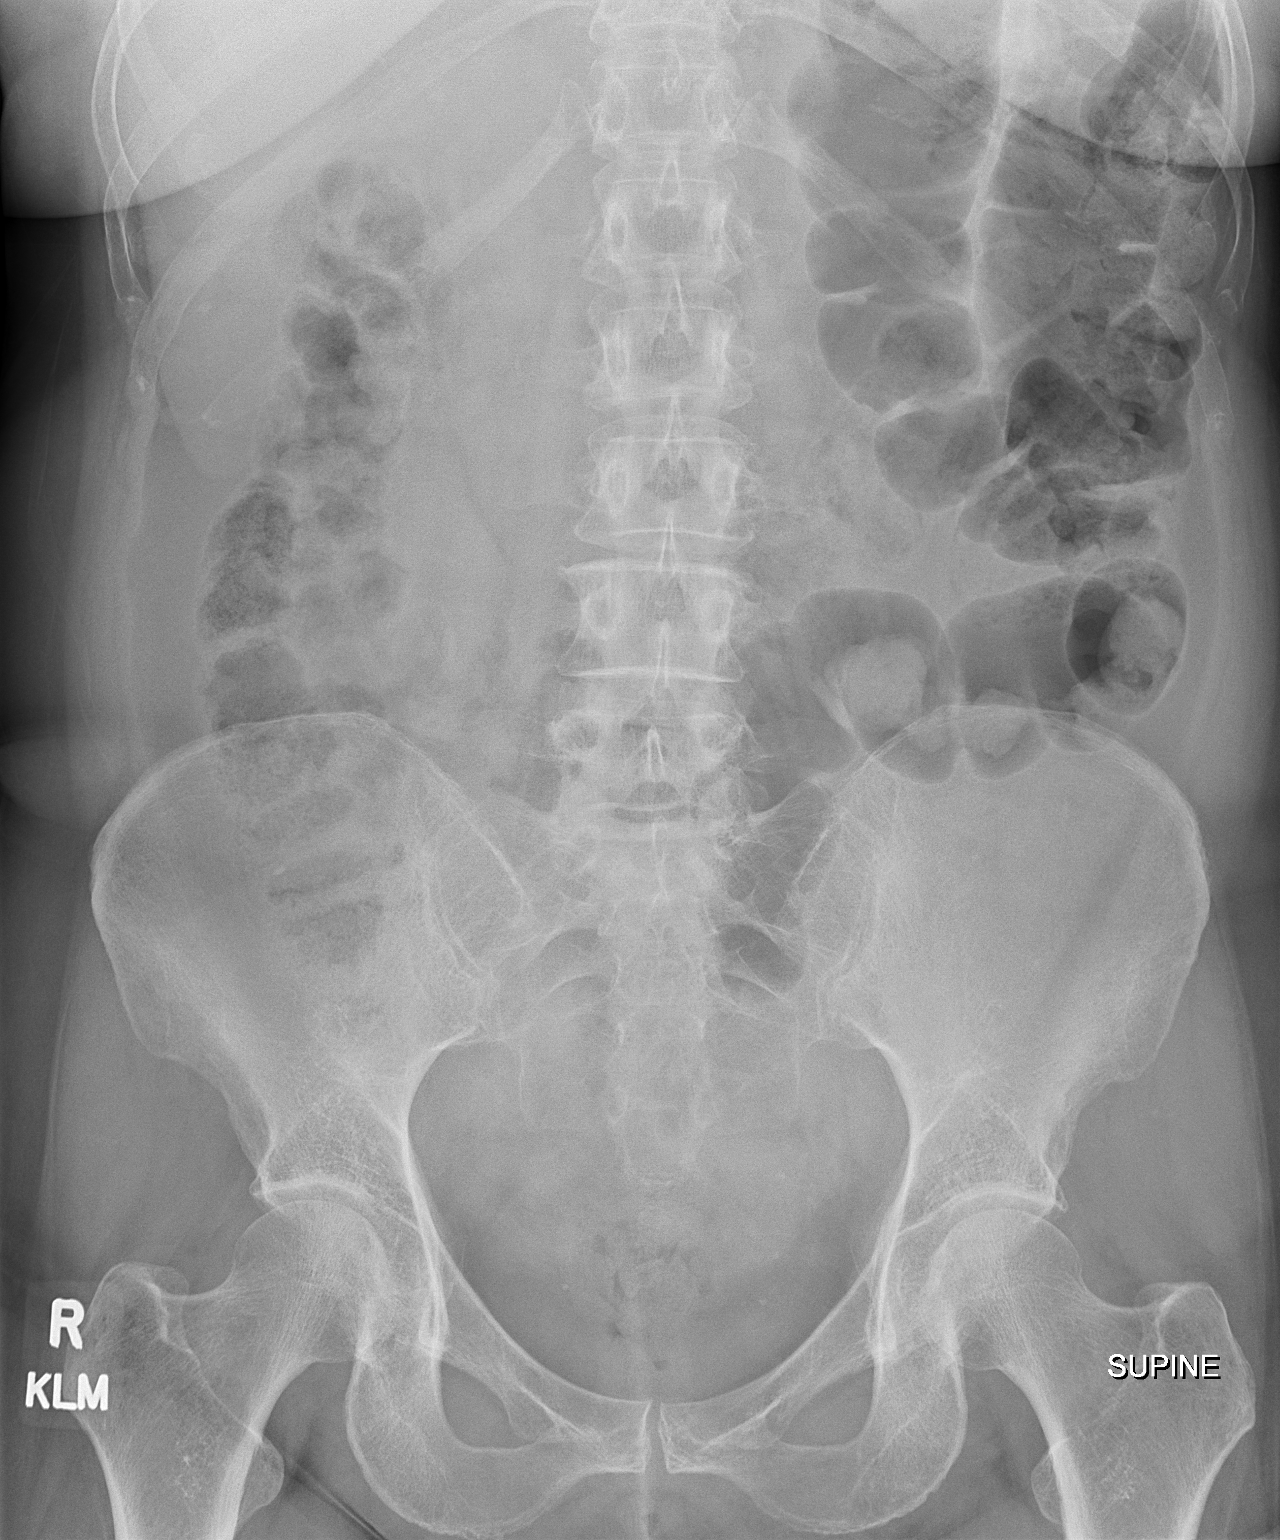

[1 of 1 positions shown; findings below may reference images not displayed]

FINDINGS: A moderate amount of stool is noted in the colon, including in the
region of the splenic flexure where there is mild gaseous distension
of the colon as well. No dilated small bowel loops are seen to
suggest obstruction. No acute osseous abnormality is seen.
IMPRESSION: Moderate colonic stool burden.

## 2022-12-17 ENCOUNTER — Other Ambulatory Visit: Payer: 59

## 2022-12-17 DIAGNOSIS — E7800 Pure hypercholesterolemia, unspecified: Secondary | ICD-10-CM

## 2022-12-17 NOTE — Progress Notes (Signed)
Patient Care Team: Elby Showers, MD as PCP - General (Internal Medicine) Megan Salon, MD as Consulting Physician (Gynecology)  Visit Date: 12/24/22  Subjective:    Patient ID: Christie Molina , Female   DOB: [birth year], 62 y.o.    MRN: [id number]   62 y.o. Female presents today for a 6 month follow-up. Patient has a past medical history of GERD, hyperlipidemia, thyroid disease, Vitamin D deficiency, IBS.  History of hyperlipidemia treated with Zocor 20 mg daily in the evening. Lipid panel normal on 12/17/22. Denies  side effects with Zocor.  History of GERD treated with Pepcid 20 mg daily and stable  Longstanding history of hypothyroidism treated with Synthroid 75 mcg daily.TSH in September was normal at 1.05  History of IBS treated with Levsin 0.125 mg three times daily with meals as needed.  08/19/22 CT coronary calcium score showed  an excellent coronary calcium score of 16.    Past Medical History:  Diagnosis Date   GERD (gastroesophageal reflux disease)    Hyperlipidemia    Thyroid disease    Vitamin D deficiency      Family History  Problem Relation Age of Onset   Heart disease Father   Mother with hx of dementia deceased  Social Hx: Married. Non-smoker.     Review of Systems  Constitutional:  Negative for fever and malaise/fatigue.  HENT:  Negative for congestion.   Eyes:  Negative for blurred vision.  Respiratory:  Negative for cough and shortness of breath.   Cardiovascular:  Negative for chest pain, palpitations and leg swelling.  Gastrointestinal:  Negative for vomiting.  Musculoskeletal:  Negative for back pain.  Skin:  Negative for rash.  Neurological:  Negative for loss of consciousness and headaches.    Hx anxiety stable with Xanax    Objective:   Vitals: BP 114/72   Pulse 60   Temp 98.4 F (36.9 C) (Tympanic)   Ht 5' 1.25" (1.556 m)   Wt 139 lb 12.8 oz (63.4 kg)   LMP 07/22/2013 (Exact Date)   SpO2 98%   BMI 26.20 kg/m     Physical Exam Vitals and nursing note reviewed.  Constitutional:      General: She is not in acute distress.    Appearance: Normal appearance. She is not toxic-appearing.  HENT:     Head: Normocephalic and atraumatic.  Neck:     Thyroid: No thyroid mass, thyromegaly or thyroid tenderness.     Vascular: No carotid bruit.  Cardiovascular:     Rate and Rhythm: Normal rate and regular rhythm. No extrasystoles are present.    Pulses: Normal pulses.     Heart sounds: Normal heart sounds. No murmur heard.    No friction rub. No gallop.  Pulmonary:     Effort: Pulmonary effort is normal. No respiratory distress.     Breath sounds: Normal breath sounds. No wheezing or rales.  Lymphadenopathy:     Cervical: No cervical adenopathy.  Skin:    General: Skin is warm and dry.  Neurological:     Mental Status: She is alert and oriented to person, place, and time. Mental status is at baseline.  Psychiatric:        Mood and Affect: Mood normal.        Behavior: Behavior normal.        Thought Content: Thought content normal.        Judgment: Judgment normal.       Results:   Studies  obtained and personally reviewed by me:  08/19/22 CT coronary calcium score showed coronary calcium score of 16.   Labs:       Component Value Date/Time   NA 141 06/22/2022 1110   K 4.6 06/22/2022 1110   CL 103 06/22/2022 1110   CO2 30 06/22/2022 1110   GLUCOSE 88 06/22/2022 1110   BUN 11 06/22/2022 1110   CREATININE 0.77 06/22/2022 1110   CALCIUM 9.8 06/22/2022 1110   PROT 6.9 06/22/2022 1110   ALBUMIN 4.0 01/18/2017 1110   AST 23 06/22/2022 1110   ALT 13 06/22/2022 1110   ALKPHOS 50 01/18/2017 1110   BILITOT 0.6 06/22/2022 1110   GFRNONAA 74 06/17/2020 0925   GFRAA 86 06/17/2020 0925     Lab Results  Component Value Date   WBC 4.1 06/22/2022   HGB 13.3 06/22/2022   HCT 39.3 06/22/2022   MCV 90.1 06/22/2022   PLT 213 06/22/2022    Lab Results  Component Value Date   CHOL 183  12/17/2022   HDL 78 12/17/2022   LDLCALC 88 12/17/2022   TRIG 78 12/17/2022   CHOLHDL 2.3 12/17/2022    No results found for: "HGBA1C"   Lab Results  Component Value Date   TSH 1.05 06/22/2022      Assessment & Plan:   Hyperlipidemia: treated with Zocor 20 mg daily in the evening. Lipid panel normal on 12/17/22.   GERD: treated with Pepcid 20 mg daily and stable  Hypothyroidism: treated with Synthroid 75 mcg daily and TSH is WNL  IBS: treated with Levsin 0.125 mg three times daily with meals as needed.   Return in 6 months for health maintenance exam.Continue current meds.    I,Alexander Ruley,acting as a Education administrator for Elby Showers, MD.,have documented all relevant documentation on the behalf of Elby Showers, MD,as directed by  Elby Showers, MD while in the presence of Elby Showers, MD.   I, Elby Showers, MD, have reviewed all documentation for this visit. The documentation on 12/27/22 for the exam, diagnosis, procedures, and orders are all accurate and complete.

## 2022-12-18 LAB — LIPID PANEL
Cholesterol: 183 mg/dL (ref <200)
HDL: 78 mg/dL (ref >=50)
LDL Cholesterol (Calc): 88 mg/dL (calc)
Non-HDL Cholesterol (Calc): 105 mg/dL (calc) (ref <130)
Total CHOL/HDL Ratio: 2.3 (calc) (ref <5.0)
Triglycerides: 78 mg/dL (ref <150)

## 2022-12-24 ENCOUNTER — Encounter: Payer: Self-pay | Admitting: Internal Medicine

## 2022-12-24 ENCOUNTER — Ambulatory Visit: Payer: 59 | Admitting: Internal Medicine

## 2022-12-24 VITALS — BP 114/72 | HR 60 | Temp 98.4°F | Ht 61.25 in | Wt 139.8 lb

## 2022-12-24 DIAGNOSIS — E7800 Pure hypercholesterolemia, unspecified: Secondary | ICD-10-CM

## 2022-12-24 DIAGNOSIS — K580 Irritable bowel syndrome with diarrhea: Secondary | ICD-10-CM | POA: Diagnosis not present

## 2022-12-24 DIAGNOSIS — E039 Hypothyroidism, unspecified: Secondary | ICD-10-CM

## 2022-12-24 DIAGNOSIS — Z8659 Personal history of other mental and behavioral disorders: Secondary | ICD-10-CM

## 2022-12-24 DIAGNOSIS — K219 Gastro-esophageal reflux disease without esophagitis: Secondary | ICD-10-CM | POA: Diagnosis not present

## 2022-12-27 NOTE — Patient Instructions (Signed)
Labs are stable.  It was a pleasure to see you today.  Continue current medications and return in 6 months for health maintenance exam.

## 2023-01-05 LAB — HM COLONOSCOPY

## 2023-01-21 ENCOUNTER — Encounter: Payer: Self-pay | Admitting: Internal Medicine

## 2023-01-28 ENCOUNTER — Encounter (HOSPITAL_BASED_OUTPATIENT_CLINIC_OR_DEPARTMENT_OTHER): Payer: Self-pay | Admitting: Obstetrics & Gynecology

## 2023-01-28 ENCOUNTER — Ambulatory Visit (INDEPENDENT_AMBULATORY_CARE_PROVIDER_SITE_OTHER): Payer: 59 | Admitting: Obstetrics & Gynecology

## 2023-01-28 VITALS — BP 129/67 | HR 66 | Ht 61.0 in | Wt 138.2 lb

## 2023-01-28 DIAGNOSIS — E7849 Other hyperlipidemia: Secondary | ICD-10-CM

## 2023-01-28 DIAGNOSIS — Z780 Asymptomatic menopausal state: Secondary | ICD-10-CM

## 2023-01-28 DIAGNOSIS — M8588 Other specified disorders of bone density and structure, other site: Secondary | ICD-10-CM | POA: Diagnosis not present

## 2023-01-28 DIAGNOSIS — E039 Hypothyroidism, unspecified: Secondary | ICD-10-CM

## 2023-01-28 DIAGNOSIS — Z01419 Encounter for gynecological examination (general) (routine) without abnormal findings: Secondary | ICD-10-CM

## 2023-01-28 NOTE — Progress Notes (Signed)
62 y.o. G51P2002 Married White or Caucasian female here for annual exam.  Denies vaginal bleeding.  Sees Dr. Lenord Fellers for primary care needs.  Is on simvastatin and just blood work in March.    Patient's last menstrual period was 07/22/2013 (exact date).          Sexually active: Yes.    The current method of family planning is post menopausal status.    Smoker:  no  Health Maintenance: Pap:  01/08/2022 Normal History of abnormal Pap:  no MMG:  08/30/2022 Negative Colonoscopy:  04/16/2014, follow up net year BMD:   07/24/2022 Osteopenia, recheck 3-4 years Screening Labs: does with Dr. Lenord Fellers   reports that she quit smoking about 36 years ago. Her smoking use included cigarettes. She has a 0.40 pack-year smoking history. She has never used smokeless tobacco. She reports current alcohol use. She reports that she does not use drugs.  Past Medical History:  Diagnosis Date   GERD (gastroesophageal reflux disease)    Hyperlipidemia    Thyroid disease    Vitamin D deficiency     Past Surgical History:  Procedure Laterality Date   COLONOSCOPY WITH PROPOFOL N/A 07/24/2014   Procedure: COLONOSCOPY WITH PROPOFOL;  Surgeon: Charolett Bumpers, MD;  Location: [facility];  Service: Endoscopy;  Laterality: N/A;   UPPER GASTROINTESTINAL ENDOSCOPY  08/2018    Current Outpatient Medications  Medication Sig Dispense Refill   cetirizine (ZYRTEC) 10 MG tablet Take 10 mg by mouth daily.     Cholecalciferol (VITAMIN D PO) Take by mouth.     famotidine (PEPCID) 20 MG tablet Take 20 mg by mouth daily.     levothyroxine (SYNTHROID) 75 MCG tablet TAKE 1 TABLET(75 MCG) BY MOUTH DAILY 90 tablet 3   simvastatin (ZOCOR) 20 MG tablet TAKE 1 TABLET(20 MG) BY MOUTH EVERY EVENING 90 tablet 3   Hyoscyamine Sulfate SL (LEVSIN/SL) 0.125 MG SUBL Place 1 tablet under the tongue 3 (three) times daily with meals as needed. (Patient not taking: Reported on 12/24/2022) 60 tablet 1   No current facility-administered  medications for this visit.    Family History  Problem Relation Age of Onset   Heart disease Father     ROS: Constitutional: negative Genitourinary:negative  Exam:   BP 129/67 (BP Location: Right Arm, Patient Position: Sitting, Cuff Size: Normal)   Pulse 66   Ht 5\' 1"  (1.549 m) Comment: Reported  Wt 138 lb 3.2 oz (62.7 kg)   LMP 07/22/2013 (Exact Date)   BMI 26.11 kg/m   Height: 5\' 1"  (154.9 cm) (Reported)  General appearance: alert, cooperative and appears stated age Head: Normocephalic, without obvious abnormality, atraumatic Neck: no adenopathy, supple, symmetrical, trachea midline and thyroid normal to inspection and palpation Lungs: clear to auscultation bilaterally Breasts: normal appearance, no masses or tenderness Heart: regular rate and rhythm Abdomen: soft, non-tender; bowel sounds normal; no masses,  no organomegaly Extremities: extremities normal, atraumatic, no cyanosis or edema Skin: Skin color, texture, turgor normal. No rashes or lesions Lymph nodes: Cervical, supraclavicular, and axillary nodes normal. No abnormal inguinal nodes palpated Neurologic: Grossly normal   Pelvic: External genitalia:  no lesions              Urethra:  normal appearing urethra with no masses, tenderness or lesions              Bartholins and Skenes: normal                 Vagina: normal appearing vagina  with normal color and no discharge, no lesions              Cervix: no lesions              Pap taken: No. Bimanual Exam:  Uterus:  normal size, contour, position, consistency, mobility, non-tender              Adnexa: normal adnexa and no mass, fullness, tenderness               Rectovaginal: Confirms               Anus:  normal sphincter tone, no lesions  Chaperone, Ina Homes, CMA, was present for exam.  Assessment/Plan: 1. Well woman exam with routine gynecological exam - Pap smear 01/08/2022 neg with neg HR HPV.  Not indicated today. - Mammogram 08/30/2022 -  Colonoscopy 04/16/214, follow up 10 years.  Pt aware due next year. - Bone mineral density 07/24/2022.  Recheck 3-4 years - lab work done with PCP, Dr. Lenord Fellers - vaccines reviewed/updated  2. Postmenopausal - not on HRT  3. Osteopenia of lumbar spine - taking Vit D  4. Hypothyroidism, unspecified type - on levothyroxine  5. Other hyperlipidemia

## 2023-02-02 ENCOUNTER — Other Ambulatory Visit: Payer: Self-pay | Admitting: Internal Medicine

## 2023-02-25 ENCOUNTER — Telehealth: Payer: Self-pay | Admitting: Internal Medicine

## 2023-02-25 ENCOUNTER — Ambulatory Visit: Payer: 59 | Admitting: Internal Medicine

## 2023-02-25 ENCOUNTER — Encounter: Payer: Self-pay | Admitting: Internal Medicine

## 2023-02-25 VITALS — BP 114/78 | HR 61 | Temp 98.5°F | Ht 61.0 in | Wt 138.8 lb

## 2023-02-25 DIAGNOSIS — M546 Pain in thoracic spine: Secondary | ICD-10-CM

## 2023-02-25 MED ORDER — MELOXICAM 7.5 MG PO TABS: ORAL_TABLET | ORAL | 0 refills | Status: DC

## 2023-02-25 MED ORDER — HYDROCODONE-ACETAMINOPHEN 5-325 MG PO TABS: 1.0000 | ORAL_TABLET | Freq: Four times a day (QID) | ORAL | 0 refills | Status: DC | PRN

## 2023-02-25 NOTE — Progress Notes (Signed)
Patient Care Team: Margaree Mackintosh, MD as PCP - General (Internal Medicine) Jerene Bears, MD as Consulting Physician (Gynecology)  Visit Date: 02/25/23  Subjective:    Patient ID: Christie Molina , Female   DOB: 23-Apr-1961, 62 y.o.    MRN: [id number]   62 y.o. Female presents today for middle back pain. Had some indigestion over the weekend of 02/19/23. Now feeling pinching pain in middle of her back since night of 02/24/23. Denies chest pain, shortness of breath. Using heating pad. Pain is worse upon increased activity. She has not been lifting anything heavy.  Past Medical History:  Diagnosis Date   GERD (gastroesophageal reflux disease)    Hyperlipidemia    Thyroid disease    Vitamin D deficiency      Family History  Problem Relation Age of Onset   Heart disease Father     Social History   Social History Narrative   Not on file      Review of Systems  Constitutional:  Negative for fever and malaise/fatigue.  HENT:  Negative for congestion.   Eyes:  Negative for blurred vision.  Respiratory:  Negative for cough and shortness of breath.   Cardiovascular:  Negative for chest pain, palpitations and leg swelling.  Gastrointestinal:  Negative for vomiting.  Musculoskeletal:  Positive for back pain (Middle, pinching).  Skin:  Negative for rash.  Neurological:  Negative for loss of consciousness and headaches.        Objective:   Vitals: BP 114/78   Pulse 61   Temp 98.5 F (36.9 C) (Tympanic)   Ht 5\' 1"  (1.549 m)   Wt 138 lb 12.8 oz (63 kg)   LMP 07/22/2013 (Exact Date)   SpO2 99%   BMI 26.23 kg/m    Physical Exam Vitals and nursing note reviewed.  Constitutional:      General: She is not in acute distress.    Appearance: Normal appearance. She is not toxic-appearing.  HENT:     Head: Normocephalic and atraumatic.  Pulmonary:     Effort: Pulmonary effort is normal.  Musculoskeletal:     Comments: Tenderness left lower parathoracic area.  Skin:     General: Skin is warm and dry.     Comments: Erythema   Neurological:     Mental Status: She is alert and oriented to person, place, and time. Mental status is at baseline.  Psychiatric:        Mood and Affect: Mood normal.        Behavior: Behavior normal.        Thought Content: Thought content normal.        Judgment: Judgment normal.       Results:   Studies obtained and personally reviewed by me:   Labs:       Component Value Date/Time   NA 141 06/22/2022 1110   K 4.6 06/22/2022 1110   CL 103 06/22/2022 1110   CO2 30 06/22/2022 1110   GLUCOSE 88 06/22/2022 1110   BUN 11 06/22/2022 1110   CREATININE 0.77 06/22/2022 1110   CALCIUM 9.8 06/22/2022 1110   PROT 6.9 06/22/2022 1110   ALBUMIN 4.0 01/18/2017 1110   AST 23 06/22/2022 1110   ALT 13 06/22/2022 1110   ALKPHOS 50 01/18/2017 1110   BILITOT 0.6 06/22/2022 1110   GFRNONAA 74 06/17/2020 0925   GFRAA 86 06/17/2020 0925     Lab Results  Component Value Date   WBC 4.1 06/22/2022  HGB 13.3 06/22/2022   HCT 39.3 06/22/2022   MCV 90.1 06/22/2022   PLT 213 06/22/2022    Lab Results  Component Value Date   CHOL 183 12/17/2022   HDL 78 12/17/2022   LDLCALC 88 12/17/2022   TRIG 78 12/17/2022   CHOLHDL 2.3 12/17/2022    No results found for: "HGBA1C"   Lab Results  Component Value Date   TSH 1.05 06/22/2022      Assessment & Plan:   Left lower parathoracic pain: Prescribed meloxicam 7.5 mg 1-2 tablets daily with meal, Norco 5-325 mg every 6 hours as needed. Can use ice/heat therapy. If no improvement will consider PT referral.    I,Alexander Ruley,acting as a scribe for Margaree Mackintosh, MD.,have documented all relevant documentation on the behalf of Margaree Mackintosh, MD,as directed by  Margaree Mackintosh, MD while in the presence of Margaree Mackintosh, MD.   I, Margaree Mackintosh, MD, have reviewed all documentation for this visit. The documentation on 03/05/23 for the exam, diagnosis, procedures, and orders are all  accurate and complete.

## 2023-02-25 NOTE — Telephone Encounter (Signed)
Christie Molina [phone]  [place] called to say she has middle back pain, just below her bra line that keep her up last night. It feels like it is pinching inside. She would like to come in today and see you.

## 2023-02-25 NOTE — Telephone Encounter (Signed)
scheduled

## 2023-03-05 NOTE — Patient Instructions (Signed)
You have been diagnosed with musculoskeletal pain in the left parathoracic area.  Please take Meloxicam7.5 mg 1 or 2 tablets daily with a meal.  Prescribed Norco 5/325 to take sparingly every 6 hours as needed for more severe parathoracic pain.  May use ice or heat whichever feels better.  If no improvement, will consider physical therapy.

## 2023-03-09 ENCOUNTER — Other Ambulatory Visit: Payer: Self-pay | Admitting: Internal Medicine
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
## 2023-06-28 ENCOUNTER — Other Ambulatory Visit: Payer: 59

## 2023-06-28 DIAGNOSIS — K580 Irritable bowel syndrome with diarrhea: Secondary | ICD-10-CM

## 2023-06-28 DIAGNOSIS — Z0000 Encounter for general adult medical examination without abnormal findings: Secondary | ICD-10-CM

## 2023-06-28 DIAGNOSIS — K219 Gastro-esophageal reflux disease without esophagitis: Secondary | ICD-10-CM

## 2023-06-28 DIAGNOSIS — E7800 Pure hypercholesterolemia, unspecified: Secondary | ICD-10-CM

## 2023-06-28 DIAGNOSIS — E039 Hypothyroidism, unspecified: Secondary | ICD-10-CM

## 2023-06-29 LAB — CBC WITH DIFFERENTIAL/PLATELET
Absolute Monocytes: 353 cells/uL (ref 200–950)
Basophils Absolute: 62 cells/uL (ref 0–200)
Basophils Relative: 1.5 %
Eosinophils Absolute: 98 cells/uL (ref 15–500)
Eosinophils Relative: 2.4 %
HCT: 41.6 % (ref 35.0–45.0)
Hemoglobin: 13.5 g/dL (ref 11.7–15.5)
Lymphs Abs: 1374 cells/uL (ref 850–3900)
MCH: 29.5 pg (ref 27.0–33.0)
MCHC: 32.5 g/dL (ref 32.0–36.0)
MCV: 91 fL (ref 80.0–100.0)
MPV: 10.3 fL (ref 7.5–12.5)
Monocytes Relative: 8.6 %
Neutro Abs: 2214 cells/uL (ref 1500–7800)
Neutrophils Relative %: 54 %
Platelets: 223 10*3/uL (ref 140–400)
RBC: 4.57 10*6/uL (ref 3.80–5.10)
RDW: 12.5 % (ref 11.0–15.0)
Total Lymphocyte: 33.5 %
WBC: 4.1 10*3/uL (ref 3.8–10.8)

## 2023-06-29 LAB — COMPLETE METABOLIC PANEL WITH GFR
AG Ratio: 1.7 (calc) (ref 1.0–2.5)
ALT: 15 U/L (ref 6–29)
AST: 20 U/L (ref 10–35)
Albumin: 4.4 g/dL (ref 3.6–5.1)
Alkaline phosphatase (APISO): 61 U/L (ref 37–153)
BUN: 11 mg/dL (ref 7–25)
CO2: 29 mmol/L (ref 20–32)
Calcium: 9.7 mg/dL (ref 8.6–10.4)
Chloride: 104 mmol/L (ref 98–110)
Creat: 0.87 mg/dL (ref 0.50–1.05)
Globulin: 2.6 g/dL (calc) (ref 1.9–3.7)
Glucose, Bld: 91 mg/dL (ref 65–99)
Potassium: 4.9 mmol/L (ref 3.5–5.3)
Sodium: 141 mmol/L (ref 135–146)
Total Bilirubin: 0.7 mg/dL (ref 0.2–1.2)
Total Protein: 7 g/dL (ref 6.1–8.1)
eGFR: 75 mL/min/{1.73_m2} (ref >=60)

## 2023-06-29 LAB — LIPID PANEL
Cholesterol: 212 mg/dL — ABNORMAL HIGH (ref <200)
HDL: 93 mg/dL (ref >=50)
LDL Cholesterol (Calc): 104 mg/dL (calc) — ABNORMAL HIGH
Non-HDL Cholesterol (Calc): 119 mg/dL (calc) (ref <130)
Total CHOL/HDL Ratio: 2.3 (calc) (ref <5.0)
Triglycerides: 68 mg/dL (ref <150)

## 2023-06-29 LAB — TSH: TSH: 0.65 mIU/L (ref 0.40–4.50)

## 2023-07-01 ENCOUNTER — Ambulatory Visit: Payer: 59 | Admitting: Internal Medicine

## 2023-07-01 ENCOUNTER — Encounter: Payer: Self-pay | Admitting: Internal Medicine

## 2023-07-01 VITALS — BP 130/80 | HR 64 | Ht 61.0 in | Wt 138.0 lb

## 2023-07-01 DIAGNOSIS — J309 Allergic rhinitis, unspecified: Secondary | ICD-10-CM

## 2023-07-01 DIAGNOSIS — Z818 Family history of other mental and behavioral disorders: Secondary | ICD-10-CM

## 2023-07-01 DIAGNOSIS — Z8659 Personal history of other mental and behavioral disorders: Secondary | ICD-10-CM

## 2023-07-01 DIAGNOSIS — Z0000 Encounter for general adult medical examination without abnormal findings: Secondary | ICD-10-CM | POA: Diagnosis not present

## 2023-07-01 DIAGNOSIS — Z23 Encounter for immunization: Secondary | ICD-10-CM | POA: Diagnosis not present

## 2023-07-01 DIAGNOSIS — E7800 Pure hypercholesterolemia, unspecified: Secondary | ICD-10-CM | POA: Diagnosis not present

## 2023-07-01 DIAGNOSIS — Z8249 Family history of ischemic heart disease and other diseases of the circulatory system: Secondary | ICD-10-CM

## 2023-07-01 DIAGNOSIS — K5901 Slow transit constipation: Secondary | ICD-10-CM

## 2023-07-01 DIAGNOSIS — E039 Hypothyroidism, unspecified: Secondary | ICD-10-CM

## 2023-07-01 DIAGNOSIS — K219 Gastro-esophageal reflux disease without esophagitis: Secondary | ICD-10-CM

## 2023-07-01 DIAGNOSIS — R7989 Other specified abnormal findings of blood chemistry: Secondary | ICD-10-CM

## 2023-07-01 DIAGNOSIS — R82998 Other abnormal findings in urine: Secondary | ICD-10-CM

## 2023-07-01 DIAGNOSIS — K580 Irritable bowel syndrome with diarrhea: Secondary | ICD-10-CM

## 2023-07-01 LAB — POCT URINALYSIS DIP (CLINITEK)
Bilirubin, UA: NEGATIVE
Glucose, UA: NEGATIVE mg/dL
Ketones, POC UA: NEGATIVE mg/dL
Nitrite, UA: NEGATIVE
POC PROTEIN,UA: NEGATIVE
Spec Grav, UA: 1.01 (ref 1.010–1.025)
Urobilinogen, UA: 0.2 E.U./dL
pH, UA: 7.5 (ref 5.0–8.0)

## 2023-07-01 NOTE — Patient Instructions (Addendum)
Patient may not have given [place] a good clean catch urine specimen. Urine culture sent. Need to clarify with [facility] when last colonoscopy was done. Do not have current colonoscopy report on file. Have endoscopy report on file. Flu vaccine given.

## 2023-07-03 LAB — URINE CULTURE
MICRO NUMBER:: 15520584
SPECIMEN QUALITY:: ADEQUATE

## 2023-07-05 ENCOUNTER — Other Ambulatory Visit: Payer: Self-pay | Admitting: Internal Medicine

## 2023-07-05 MED ORDER — CIPROFLOXACIN HCL 250 MG PO TABS: 250.0000 mg | ORAL_TABLET | Freq: Two times a day (BID) | ORAL | 0 refills | Status: AC

## 2023-07-07 NOTE — Progress Notes (Signed)
Patient Care Team: Margaree Mackintosh, MD as PCP - General (Internal Medicine) Jerene Bears, MD as Consulting Physician (Gynecology)  Visit Date: 07/07/23  Subjective:    Patient ID: Christie Molina , Female   DOB: 05/30/1961, 62 y.o.    MRN: [id number]   62 y.o. Female presents today for a UTI follow-up. 07/01/23 urinalysis showed small blood, small leukocytes. Urine culture positive for E. coli.   Past Medical History:  Diagnosis Date   GERD (gastroesophageal reflux disease)    Hyperlipidemia    Thyroid disease    Vitamin D deficiency      Family History  Problem Relation Age of Onset   Heart disease Father          ROS non contributory      Objective:   Vitals: LMP 07/22/2013 (Exact Date)    Physical Exam not examine. Urine dipstick is clear   Results:   Studies obtained and personally reviewed by me:  Urine dipstick done today to follow up on UTI                 Assessment & Plan:   E.coli UTI treated and resolved    I,Alexander Ruley,acting as a scribe for Margaree Mackintosh, MD.,have documented all relevant documentation on the behalf of Margaree Mackintosh, MD,as directed by  Margaree Mackintosh, MD while in the presence of Margaree Mackintosh, MD.   I, Margaree Mackintosh, MD, have reviewed all documentation for this visit. The documentation on 07/15/23 for the exam, diagnosis, procedures, and orders are all accurate and complete.

## 2023-07-15 ENCOUNTER — Encounter: Payer: Self-pay | Admitting: Internal Medicine

## 2023-07-15 ENCOUNTER — Ambulatory Visit (INDEPENDENT_AMBULATORY_CARE_PROVIDER_SITE_OTHER): Payer: 59 | Admitting: Internal Medicine

## 2023-07-15 VITALS — BP 120/80 | HR 62 | Ht 61.0 in | Wt 138.0 lb

## 2023-07-15 DIAGNOSIS — N390 Urinary tract infection, site not specified: Secondary | ICD-10-CM

## 2023-07-15 DIAGNOSIS — B9620 Unspecified Escherichia coli [E. coli] as the cause of diseases classified elsewhere: Secondary | ICD-10-CM

## 2023-07-15 LAB — POCT URINALYSIS DIP (CLINITEK)
Bilirubin, UA: NEGATIVE
Blood, UA: NEGATIVE
Glucose, UA: NEGATIVE mg/dL
Ketones, POC UA: NEGATIVE mg/dL
Leukocytes, UA: NEGATIVE
Nitrite, UA: NEGATIVE
POC PROTEIN,UA: NEGATIVE
Spec Grav, UA: 1.01 (ref 1.010–1.025)
Urobilinogen, UA: 0.2 U/dL
pH, UA: 6.5 (ref 5.0–8.0)

## 2023-07-15 NOTE — Progress Notes (Signed)
Patient is here for a UA recheck after having Ecoli UTI, she reports no symptoms.  UA today is clear.

## 2023-07-29 ENCOUNTER — Encounter: Payer: Self-pay | Admitting: Internal Medicine

## 2023-08-10 LAB — HM MAMMOGRAPHY

## 2023-09-01 ENCOUNTER — Other Ambulatory Visit: Payer: Self-pay | Admitting: Internal Medicine

## 2023-09-10 ENCOUNTER — Other Ambulatory Visit: Payer: Self-pay

## 2023-09-13 ENCOUNTER — Encounter: Payer: Self-pay | Admitting: Internal Medicine

## 2023-09-14 LAB — SURGICAL PATHOLOGY

## 2023-09-15 ENCOUNTER — Telehealth: Payer: Self-pay | Admitting: Hematology and Oncology

## 2023-09-15 NOTE — Telephone Encounter (Signed)
Spoke to patient to confirm upcoming afternoon  [facility] clinic appointment on 12/18, paperwork will be sent via [employer].   Gave location and time, also informed patient that the surgeon's office would be calling as well to get information from them similar to the packet that they will be receiving so make sure to do both.  Reminded patient that all providers will be coming to the clinic to see them HERE and if they had any questions to not hesitate to reach back out to myself or their navigators.

## 2023-09-20 ENCOUNTER — Encounter: Payer: Self-pay | Admitting: *Deleted

## 2023-09-20 ENCOUNTER — Encounter: Payer: Self-pay | Admitting: Internal Medicine

## 2023-09-20 DIAGNOSIS — Z170 Estrogen receptor positive status [ER+]: Secondary | ICD-10-CM | POA: Insufficient documentation

## 2023-09-21 ENCOUNTER — Encounter: Payer: Self-pay | Admitting: Diagnostic Radiology

## 2023-09-21 NOTE — Progress Notes (Signed)
Radiation Oncology         [phone]) [phone] ________________________________  Name: Christie Molina        MRN: [id number]  Date of Service: 09/22/2023 DOB: [birth year]  CC:Margaree Mackintosh, MD  Griselda Miner, MD     REFERRING PHYSICIAN: Chevis Pretty III, MD   DIAGNOSIS: The encounter diagnosis was Malignant neoplasm of upper-outer quadrant of right breast in female, estrogen receptor positive (HCC).   HISTORY OF PRESENT ILLNESS: Christie Molina is a 62 y.o. female seen in the multidisciplinary breast clinic for a new diagnosis of right breast cancer. The patient was noted to have a screening detected architectural distortion in the left breast as well as a mass in the  upper outer quadrant of the right breast.  She underwent further diagnostic imaging on 08/26/2023, the left breast findings were consistent with superimposed normal fibroglandular tissue, but the right breast mass persisted and by ultrasound the same day showed a mass in the 10 o'clock position measuring 8 mm in greatest dimension, the axilla was negative for adenopathy.  A biopsy on 09/10/2023 showed a grade 2 invasive ductal carcinoma that was ER/PR positive, HER2 amplified with a Ki-67 of 10%.  She is seen to discuss treatment recommendations of her cancer.    PREVIOUS RADIATION THERAPY: {EXAM; YES/NO:19492::"No"}   PAST MEDICAL HISTORY:  Past Medical History:  Diagnosis Date   GERD (gastroesophageal reflux disease)    Hyperlipidemia    Thyroid disease    Vitamin D deficiency        PAST SURGICAL HISTORY: Past Surgical History:  Procedure Laterality Date   COLONOSCOPY WITH PROPOFOL N/A 07/24/2014   Procedure: COLONOSCOPY WITH PROPOFOL;  Surgeon: Charolett Bumpers, MD;  Location: [facility];  Service: Endoscopy;  Laterality: N/A;   UPPER GASTROINTESTINAL ENDOSCOPY  08/2018     FAMILY HISTORY:  Family History  Problem Relation Age of Onset   Heart disease Father      SOCIAL HISTORY:  reports that she quit  smoking about 37 years ago. Her smoking use included cigarettes. She started smoking about 41 years ago. She has a 0.4 pack-year smoking history. She has never used smokeless tobacco. She reports current alcohol use. She reports that she does not use drugs. The patient is married and lives in [place]. She ***    ALLERGIES: Patient has no known allergies.   MEDICATIONS:  Current Outpatient Medications  Medication Sig Dispense Refill   cetirizine (ZYRTEC) 10 MG tablet Take 10 mg by mouth daily.     Cholecalciferol (VITAMIN D PO) Take by mouth.     famotidine (PEPCID) 20 MG tablet Take 20 mg by mouth daily.     Hyoscyamine Sulfate SL (LEVSIN/SL) 0.125 MG SUBL Place 1 tablet under the tongue 3 (three) times daily with meals as needed. 60 tablet 1   levothyroxine (SYNTHROID) 75 MCG tablet TAKE 1 TABLET(75 MCG) BY MOUTH DAILY 90 tablet 3   meloxicam (MOBIC) 7.5 MG tablet TAKE 1 TO 2 TABLETS BY MOUTH DAILY WITH A MEAL. 30 tablet 0   simvastatin (ZOCOR) 20 MG tablet TAKE 1 TABLET(20 MG) BY MOUTH EVERY EVENING 90 tablet 3   No current facility-administered medications for this visit.     REVIEW OF SYSTEMS: On review of systems, the patient reports that she is doing ***     PHYSICAL EXAM:  Wt Readings from Last 3 Encounters:  07/15/23 138 lb (62.6 kg)  07/01/23 138 lb (62.6 kg)  02/25/23 138 lb  12.8 oz (63 kg)   Temp Readings from Last 3 Encounters:  02/25/23 98.5 F (36.9 C) (Tympanic)  12/24/22 98.4 F (36.9 C) (Tympanic)  06/25/22 97.8 F (36.6 C) (Tympanic)   BP Readings from Last 3 Encounters:  07/15/23 120/80  07/01/23 130/80  02/25/23 114/78   Pulse Readings from Last 3 Encounters:  07/15/23 62  07/01/23 64  02/25/23 61    In general this is a well appearing caucasian female in no acute distress. She's alert and oriented x4 and appropriate throughout the examination. Cardiopulmonary assessment is negative for acute distress and she exhibits normal effort.  Bilateral breast exam is deferred.    ECOG = ***  0 - Asymptomatic (Fully active, able to carry on all predisease activities without restriction)  1 - Symptomatic but completely ambulatory (Restricted in physically strenuous activity but ambulatory and able to carry out work of a light or sedentary nature. For example, light housework, office work)  2 - Symptomatic, <50% in bed during the day (Ambulatory and capable of all self care but unable to carry out any work activities. Up and about more than 50% of waking hours)  3 - Symptomatic, >50% in bed, but not bedbound (Capable of only limited self-care, confined to bed or chair 50% or more of waking hours)  4 - Bedbound (Completely disabled. Cannot carry on any self-care. Totally confined to bed or chair)  5 - Death   Santiago Glad MM, Creech RH, Tormey DC, et al. [phone]). "Toxicity and response criteria of the [facility] Group". Am. Evlyn Clines. Oncol. 5 (6): 649-55    LABORATORY DATA:  Lab Results  Component Value Date   WBC 4.1 06/28/2023   HGB 13.5 06/28/2023   HCT 41.6 06/28/2023   MCV 91.0 06/28/2023   PLT 223 06/28/2023   Lab Results  Component Value Date   NA 141 06/28/2023   K 4.9 06/28/2023   CL 104 06/28/2023   CO2 29 06/28/2023   Lab Results  Component Value Date   ALT 15 06/28/2023   AST 20 06/28/2023   ALKPHOS 50 01/18/2017   BILITOT 0.7 06/28/2023      RADIOGRAPHY: No results found.     IMPRESSION/PLAN: 1. Stage IA, cT1bN0M0, grade 2, Triple Positive invasive ductal carcinoma of the right breast. Dr. Mitzi Hansen discusses the pathology findings and reviews the nature of early stage breast disease. The consensus from the breast conference includes breast conservation with lumpectomy with *** sentinel node biopsy. Dr. Marland Kitchen recommends adjuvant chemotehrapy, and Dr. Mitzi Hansen recommends adjuvant external radiotherapy to the breast  to reduce risks of local recurrence. Dr. Mosetta Putt anticipates adjuvant antiestrogen  therapy to follow. We discussed the risks, benefits, short, and long term effects of radiotherapy, as well as the curative intent, and the patient is interested in proceeding. Dr. Mitzi Hansen discusses the delivery and logistics of radiotherapy and anticipates a course of *** weeks of radiotherapy. We will see her back a few weeks after surgery to discuss the simulation process and anticipate we starting radiotherapy about 4-6 weeks after surgery.  2. Possible genetic predisposition to malignancy. The patient is a candidate for genetic testing given her personal history. She will meet with our geneticist today in clinic.   In a visit lasting *** minutes, greater than 50% of the time was spent face to face reviewing her case, as well as in preparation of, discussing, and coordinating the patient's care.  The above documentation reflects my direct findings during this shared patient visit.  Please see the separate note by Dr. Mitzi Hansen on this date for the remainder of the patient's plan of care.    Osker Mason, [facility]    **Disclaimer: This note was dictated with voice recognition software. Similar sounding words can inadvertently be transcribed and this note may contain transcription errors which may not have been corrected upon publication of note.**

## 2023-09-22 ENCOUNTER — Encounter: Payer: Self-pay | Admitting: Physical Therapy

## 2023-09-22 ENCOUNTER — Other Ambulatory Visit: Payer: Self-pay

## 2023-09-22 ENCOUNTER — Ambulatory Visit: Payer: Self-pay | Admitting: General Surgery

## 2023-09-22 ENCOUNTER — Ambulatory Visit: Payer: 59 | Attending: General Surgery | Admitting: Physical Therapy

## 2023-09-22 ENCOUNTER — Ambulatory Visit
Admission: RE | Admit: 2023-09-22 | Discharge: 2023-09-22 | Disposition: A | Discharge status: Home / Self Care | Payer: 59 | Source: Ambulatory Visit | Attending: Radiation Oncology | Admitting: Radiation Oncology

## 2023-09-22 ENCOUNTER — Inpatient Hospital Stay: Payer: 59 | Admitting: Hematology and Oncology

## 2023-09-22 ENCOUNTER — Inpatient Hospital Stay: Payer: 59 | Attending: Hematology and Oncology

## 2023-09-22 ENCOUNTER — Inpatient Hospital Stay (HOSPITAL_BASED_OUTPATIENT_CLINIC_OR_DEPARTMENT_OTHER): Payer: 59 | Admitting: Genetic Counselor

## 2023-09-22 ENCOUNTER — Encounter: Payer: Self-pay | Admitting: Hematology and Oncology

## 2023-09-22 VITALS — BP 132/62 | HR 67 | Temp 97.2°F | Resp 18 | Ht 61.0 in | Wt 140.2 lb

## 2023-09-22 DIAGNOSIS — C50411 Malignant neoplasm of upper-outer quadrant of right female breast: Secondary | ICD-10-CM

## 2023-09-22 DIAGNOSIS — E079 Disorder of thyroid, unspecified: Secondary | ICD-10-CM | POA: Diagnosis not present

## 2023-09-22 DIAGNOSIS — Z170 Estrogen receptor positive status [ER+]: Secondary | ICD-10-CM | POA: Diagnosis not present

## 2023-09-22 DIAGNOSIS — Z1721 Progesterone receptor positive status: Secondary | ICD-10-CM

## 2023-09-22 DIAGNOSIS — K219 Gastro-esophageal reflux disease without esophagitis: Secondary | ICD-10-CM

## 2023-09-22 DIAGNOSIS — E785 Hyperlipidemia, unspecified: Secondary | ICD-10-CM

## 2023-09-22 DIAGNOSIS — N6310 Unspecified lump in the right breast, unspecified quadrant: Secondary | ICD-10-CM | POA: Insufficient documentation

## 2023-09-22 DIAGNOSIS — Z87891 Personal history of nicotine dependence: Secondary | ICD-10-CM | POA: Insufficient documentation

## 2023-09-22 DIAGNOSIS — Z8249 Family history of ischemic heart disease and other diseases of the circulatory system: Secondary | ICD-10-CM

## 2023-09-22 DIAGNOSIS — Z79899 Other long term (current) drug therapy: Secondary | ICD-10-CM

## 2023-09-22 DIAGNOSIS — R293 Abnormal posture: Secondary | ICD-10-CM | POA: Insufficient documentation

## 2023-09-22 DIAGNOSIS — Z800 Family history of malignant neoplasm of digestive organs: Secondary | ICD-10-CM

## 2023-09-22 DIAGNOSIS — Z79890 Hormone replacement therapy (postmenopausal): Secondary | ICD-10-CM | POA: Diagnosis not present

## 2023-09-22 DIAGNOSIS — Z803 Family history of malignant neoplasm of breast: Secondary | ICD-10-CM

## 2023-09-22 LAB — CBC WITH DIFFERENTIAL (CANCER CENTER ONLY)
Abs Immature Granulocytes: 0.01 10*3/uL (ref 0.00–0.07)
Basophils Absolute: 0.1 10*3/uL (ref 0.0–0.1)
Basophils Relative: 1 %
Eosinophils Absolute: 0.1 10*3/uL (ref 0.0–0.5)
Eosinophils Relative: 2 %
HCT: 40.5 % (ref 36.0–46.0)
Hemoglobin: 13.5 g/dL (ref 12.0–15.0)
Immature Granulocytes: 0 %
Lymphocytes Relative: 32 %
Lymphs Abs: 1.3 10*3/uL (ref 0.7–4.0)
MCH: 30.2 pg (ref 26.0–34.0)
MCHC: 33.3 g/dL (ref 30.0–36.0)
MCV: 90.6 fL (ref 80.0–100.0)
Monocytes Absolute: 0.4 10*3/uL (ref 0.1–1.0)
Monocytes Relative: 10 %
Neutro Abs: 2.3 10*3/uL (ref 1.7–7.7)
Neutrophils Relative %: 55 %
Platelet Count: 227 10*3/uL (ref 150–400)
RBC: 4.47 MIL/uL (ref 3.87–5.11)
RDW: 12.6 % (ref 11.5–15.5)
WBC Count: 4.2 10*3/uL (ref 4.0–10.5)
nRBC: 0 % (ref 0.0–0.2)

## 2023-09-22 LAB — CMP (CANCER CENTER ONLY)
ALT: 17 U/L (ref 0–44)
AST: 19 U/L (ref 15–41)
Albumin: 4.2 g/dL (ref 3.5–5.0)
Alkaline Phosphatase: 59 U/L (ref 38–126)
Anion gap: 4 — ABNORMAL LOW (ref 5–15)
BUN: 12 mg/dL (ref 8–23)
CO2: 31 mmol/L (ref 22–32)
Calcium: 9.2 mg/dL (ref 8.9–10.3)
Chloride: 106 mmol/L (ref 98–111)
Creatinine: 0.92 mg/dL (ref 0.44–1.00)
GFR, Estimated: >60 mL/min (ref >60)
Glucose, Bld: 95 mg/dL (ref 70–99)
Potassium: 4.2 mmol/L (ref 3.5–5.1)
Sodium: 141 mmol/L (ref 135–145)
Total Bilirubin: 0.4 mg/dL (ref <1.2)
Total Protein: 6.5 g/dL (ref 6.5–8.1)

## 2023-09-22 LAB — GENETIC SCREENING ORDER

## 2023-09-22 MED ORDER — KETOROLAC TROMETHAMINE 15 MG/ML IJ SOLN: 15.0000 mg | INTRAMUSCULAR | Status: AC

## 2023-09-22 NOTE — Research (Signed)
Trial:  [employer] 2021-05 - Specimen Collection Study to Evaluate Biomarkers in Subjects with Cancer  Patient Christie Molina was identified by Dr Pamelia Hoit as a potential candidate for the above listed study.  This Clinical Research Nurse met with Christie Molina, [id number], on 09/22/23 in a manner and location that ensures patient privacy to discuss participation in the above listed research study.  Patient is Accompanied by her family .   Patient does not wish to participate in study enrollment due to high burden of upcoming care. Provided research contact information in case she changes her mind. [name], RN, BSN, [facility] She  Her  Hers Clinical Research Nurse [facility] Direct Dial [phone] 09/22/2023 4:38 PM

## 2023-09-22 NOTE — Assessment & Plan Note (Signed)
09/10/2023: Screening mammogram detected right breast mass UOQ: 0.8 cm at 10:00, axilla negative, biopsy: Grade 2 IDC ER 100%, PR 80%, Ki67 10%, HER2 positive by FISH ratio 3.26 copy #7  Pathology and radiology counseling: Discussed with the patient, the details of pathology including the type of breast cancer,the clinical staging, the significance of ER, PR and HER-2/neu receptors and the implications for treatment. After reviewing the pathology in detail, we proceeded to discuss the different treatment options between surgery, radiation, chemotherapy, antiestrogen therapies.  Treatment plan: Breast conserving surgery Adjuvant Taxol Herceptin followed by Herceptin maintenance for 1 year Adjuvant radiation Adjuvant antiestrogen therapy  Return to clinic after surgery to discuss final pathology report and to finalize a treatment plan.

## 2023-09-22 NOTE — Progress Notes (Signed)
[facility] CONSULT NOTE  Patient Care Team: Baxley, Luanna Cole, MD as PCP - General (Internal Medicine) Jerene Bears, MD as Consulting Physician (Gynecology) Donnelly Angelica, RN as Oncology Nurse Navigator Pershing Proud, RN as Oncology Nurse Navigator Griselda Miner, MD as Consulting Physician (General Surgery) Serena Croissant, MD as Consulting Physician (Hematology and Oncology) Dorothy Puffer, MD as Consulting Physician (Radiation Oncology)  CHIEF COMPLAINTS/PURPOSE OF CONSULTATION:  Newly diagnosed breast cancer  HISTORY OF PRESENTING ILLNESS: Ms. Santoso is a 62 year old who had a screening mammogram detected mass in the right breast upper outer quadrant measuring 0.8 cm at 10 o'clock position axilla was negative.  Contrast-enhanced mammogram also confirmed the same finding.  Biopsy of [address] revealed a grade 2 IDC ER 100% PR 80% Ki67 10%, HER2 was read as positive 3+.  She was presented this morning to the multidisciplinary clinic tumor board and she is here with the breast clinic to discuss her treatment plan.  I reviewed her records extensively and collaborated the history with the patient.  SUMMARY OF ONCOLOGIC HISTORY: Oncology History  Malignant neoplasm of upper-outer quadrant of right breast in female, estrogen receptor positive (HCC)  09/10/2023 Initial Diagnosis   Screening mammogram detected right breast mass UOQ: 0.8 cm at 10:00, axilla negative, biopsy: Grade 2 IDC ER 100%, PR 80%, Ki67 10%, HER2 positive by FISH ratio 3.26 copy #7    09/21/2023 Cancer Staging   Staging form: Breast, AJCC 8th Edition - Clinical stage from 09/21/2023: Stage IA (cT1b, cN0, cM0, G2, ER+, PR+, HER2+) - Signed by Ronny Bacon, PA-C on 09/21/2023 Stage prefix: Initial diagnosis Method of lymph node assessment: Clinical Histologic grading system: 3 grade system      MEDICAL HISTORY:  Past Medical History:  Diagnosis Date   Breast cancer (HCC)    GERD  (gastroesophageal reflux disease)    Hyperlipidemia    Thyroid disease    Vitamin D deficiency     SURGICAL HISTORY: Past Surgical History:  Procedure Laterality Date   COLONOSCOPY WITH PROPOFOL N/A 07/24/2014   Procedure: COLONOSCOPY WITH PROPOFOL;  Surgeon: Charolett Bumpers, MD;  Location: [facility];  Service: Endoscopy;  Laterality: N/A;   UPPER GASTROINTESTINAL ENDOSCOPY  08/2018    SOCIAL HISTORY: Social History   Socioeconomic History   Marital status: Married    Spouse name: Not on file   Number of children: 2   Years of education: Not on file   Highest education level: Not on file  Occupational History   Not on file  Tobacco Use   Smoking status: Former    Current packs/day: 0.00    Average packs/day: 0.1 packs/day for 4.0 years (0.4 ttl pk-yrs)    Types: Cigarettes    Start date: 05/05/1982    Quit date: 05/05/1986    Years since quitting: 37.4   Smokeless tobacco: Never  Vaping Use   Vaping status: Never Used  Substance and Sexual Activity   Alcohol use: Yes    Alcohol/week: 0.0 - 1.0 standard drinks of alcohol   Drug use: No   Sexual activity: Yes    Partners: Male    Birth control/protection: Post-menopausal  Other Topics Concern   Not on file  Social History Narrative   Not on file   Social Drivers of Health   Financial Resource Strain: Not on file  Food Insecurity: Not on file  Transportation Needs: Not on file  Physical Activity: Not on file  Stress: Not on  file  Social Connections: Not on file  Intimate Partner Violence: Not on file    FAMILY HISTORY: Family History  Problem Relation Age of Onset   Heart disease Father    Breast cancer Paternal Aunt    Breast cancer Cousin     ALLERGIES:  has no known allergies.  MEDICATIONS:  Current Outpatient Medications  Medication Sig Dispense Refill   levothyroxine (SYNTHROID) 75 MCG tablet TAKE 1 TABLET(75 MCG) BY MOUTH DAILY 90 tablet 3   meloxicam (MOBIC) 7.5 MG tablet TAKE 1 TO 2  TABLETS BY MOUTH DAILY WITH A MEAL. 30 tablet 0   pantoprazole (PROTONIX) 20 MG tablet Take 20 mg by mouth daily.     simvastatin (ZOCOR) 20 MG tablet TAKE 1 TABLET(20 MG) BY MOUTH EVERY EVENING 90 tablet 3   cetirizine (ZYRTEC) 10 MG tablet Take 10 mg by mouth daily. (Patient not taking: Reported on 09/22/2023)     Cholecalciferol (VITAMIN D PO) Take by mouth. (Patient not taking: Reported on 09/22/2023)     famotidine (PEPCID) 20 MG tablet Take 20 mg by mouth daily. (Patient not taking: Reported on 09/22/2023)     Hyoscyamine Sulfate SL (LEVSIN/SL) 0.125 MG SUBL Place 1 tablet under the tongue 3 (three) times daily with meals as needed. (Patient not taking: Reported on 09/22/2023) 60 tablet 1   No current facility-administered medications for this visit.    REVIEW OF SYSTEMS:   Constitutional: Denies fevers, chills or abnormal night sweats Breast:  Denies any palpable lumps or discharge All other systems were reviewed with the patient and are negative.  PHYSICAL EXAMINATION: ECOG PERFORMANCE STATUS: 0 - Asymptomatic  Vitals:   09/22/23 1311  BP: 132/62  Pulse: 67  Resp: 18  Temp: (!) 97.2 F (36.2 C)  SpO2: 100%   Filed Weights   09/22/23 1311  Weight: 140 lb 3.2 oz (63.6 kg)    GENERAL:alert, no distress and comfortable    LABORATORY DATA:  I have reviewed the data as listed Lab Results  Component Value Date   WBC 4.2 09/22/2023   HGB 13.5 09/22/2023   HCT 40.5 09/22/2023   MCV 90.6 09/22/2023   PLT 227 09/22/2023   Lab Results  Component Value Date   NA 141 09/22/2023   K 4.2 09/22/2023   CL 106 09/22/2023   CO2 31 09/22/2023    RADIOGRAPHIC STUDIES: I have personally reviewed the radiological reports and agreed with the findings in the report.  ASSESSMENT AND PLAN:  Malignant neoplasm of upper-outer quadrant of right breast in female, estrogen receptor positive (HCC) 09/10/2023: Screening mammogram detected right breast mass UOQ: 0.8 cm at 10:00, axilla  negative, biopsy: Grade 2 IDC ER 100%, PR 80%, Ki67 10%, HER2 positive by FISH ratio 3.26 copy #7  Pathology and radiology counseling: Discussed with the patient, the details of pathology including the type of breast cancer,the clinical staging, the significance of ER, PR and HER-2/neu receptors and the implications for treatment. After reviewing the pathology in detail, we proceeded to discuss the different treatment options between surgery, radiation, chemotherapy, antiestrogen therapies.  Treatment plan: Breast conserving surgery Adjuvant Taxol Herceptin followed by Herceptin maintenance for 1 year (we will repeat the HER2 testing on the final pathology before making a decision regarding adjuvant chemotherapy with Herceptin) Adjuvant radiation Adjuvant antiestrogen therapy  Return to clinic after surgery to discuss final pathology report and to finalize a treatment plan.    All questions were answered. The patient knows to call the clinic  with any problems, questions or concerns.    Tamsen Meek, MD 09/22/23

## 2023-09-22 NOTE — Therapy (Signed)
OUTPATIENT PHYSICAL THERAPY BREAST CANCER BASELINE EVALUATION   Patient Name: Christie Molina MRN: [id number] DOB:07-22-1961, 62 y.o., female Today's Date: 09/22/2023  END OF SESSION:  PT End of Session - 09/22/23 1509     Visit Number 1    Number of Visits 2    Date for PT Re-Evaluation 11/17/23    PT Start Time 1344    PT Stop Time 1411    PT Time Calculation (min) 27 min    Activity Tolerance Patient tolerated treatment well    Behavior During Therapy [facility] for tasks assessed/performed             Past Medical History:  Diagnosis Date   Breast cancer (HCC)    GERD (gastroesophageal reflux disease)    Hyperlipidemia    Thyroid disease    Vitamin D deficiency    Past Surgical History:  Procedure Laterality Date   COLONOSCOPY WITH PROPOFOL N/A 07/24/2014   Procedure: COLONOSCOPY WITH PROPOFOL;  Surgeon: Charolett Bumpers, MD;  Location: [facility];  Service: Endoscopy;  Laterality: N/A;   UPPER GASTROINTESTINAL ENDOSCOPY  08/2018   Patient Active Problem List   Diagnosis Date Noted   Malignant neoplasm of upper-outer quadrant of right breast in female, estrogen receptor positive (HCC) 09/20/2023   Hyperplastic polyps of stomach 06/05/2019   Anxiety state 07/06/2013   Hypothyroidism 06/29/2011   GE reflux 06/29/2011   Hyperlipidemia 06/29/2011    REFERRING PROVIDER: Dr. Chevis Pretty  REFERRING DIAG: Right breast cancer  THERAPY DIAG:  Malignant neoplasm of upper-outer quadrant of right breast in female, estrogen receptor positive (HCC)  Abnormal posture  Rationale for Evaluation and Treatment: Rehabilitation  ONSET DATE: 09/10/2023  SUBJECTIVE:                                                                                                                                                                                           SUBJECTIVE STATEMENT: Patient reports she is here today to be seen by her medical team for her newly diagnosed right breast cancer.    PERTINENT HISTORY:  Patient was diagnosed on 09/10/2023 with right grade 2 invasive ductal carcinoma breast cancer. It measures 8 mm and is located in the upper outer quadrant. It is triple positive with a Ki67 of 10%.   PATIENT GOALS:   reduce lymphedema risk and learn post op HEP.   PAIN:  Are you having pain? No  PRECAUTIONS: Active CA   RED FLAGS: None   HAND DOMINANCE: right  WEIGHT BEARING RESTRICTIONS: No  FALLS:  Has patient fallen in last 6 months? No  LIVING ENVIRONMENT: Patient lives with: her  husband Lives in: House/apartment Has following equipment at home: None  OCCUPATION: Accountant  LEISURE: She does not exercise  PRIOR LEVEL OF FUNCTION: Independent   OBJECTIVE: Note: Objective measures were completed at Evaluation unless otherwise noted.  COGNITION: Overall cognitive status: Within functional limits for tasks assessed    POSTURE:  Forward head and rounded shoulders posture  UPPER EXTREMITY AROM/PROM:  A/PROM RIGHT   eval   Shoulder extension 60  Shoulder flexion 160  Shoulder abduction 162  Shoulder internal rotation 78  Shoulder external rotation 80    (Blank rows = not tested)  A/PROM LEFT   eval  Shoulder extension 60  Shoulder flexion 150  Shoulder abduction 160  Shoulder internal rotation 67  Shoulder external rotation 80    (Blank rows = not tested)  CERVICAL AROM: All within normal limits   UPPER EXTREMITY STRENGTH: WNL  LYMPHEDEMA ASSESSMENTS (in cm):   LANDMARK RIGHT   eval  10 cm proximal to olecranon process 27.2  Olecranon process 23.5  10 cm proximal to ulnar styloid process 21.1  Just proximal to ulnar styloid process 14  Across hand at thumb web space 18  At base of 2nd digit 5.9  (Blank rows = not tested)  LANDMARK LEFT   eval  10 cm proximal to olecranon process 27.2  Olecranon process 22.6  10 cm proximal to ulnar styloid process 20  Just proximal to ulnar styloid process 13.7  Across hand at  thumb web space 17.3  At base of 2nd digit 5.9  (Blank rows = not tested)  L-DEX LYMPHEDEMA SCREENING:  The patient was assessed using the L-Dex machine today to produce a lymphedema index baseline score. The patient will be reassessed on a regular basis (typically every 3 months) to obtain new L-Dex scores. If the score is > 6.5 points away from his/her baseline score indicating onset of subclinical lymphedema, it will be recommended to wear a compression garment for 4 weeks, 12 hours per day and then be reassessed. If the score continues to be > 6.5 points from baseline at reassessment, we will initiate lymphedema treatment. Assessing in this manner has a 95% rate of preventing clinically significant lymphedema.   L-DEX FLOWSHEETS - 09/22/23 1500       L-DEX LYMPHEDEMA SCREENING   Measurement Type Unilateral    L-DEX MEASUREMENT EXTREMITY Upper Extremity    POSITION  Standing    DOMINANT SIDE Right    At Risk Side Right    BASELINE SCORE (UNILATERAL) 1.1             QUICK DASH SURVEY:  Neldon Mc - 09/22/23 0001     Open a tight or new jar No difficulty    Do heavy household chores (wash walls, wash floors) No difficulty    Carry a shopping bag or briefcase No difficulty    Wash your back No difficulty    Use a knife to cut food No difficulty    Recreational activities in which you take some force or impact through your arm, shoulder, or hand (golf, hammering, tennis) No difficulty    During the past week, to what extent has your arm, shoulder or hand problem interfered with your normal social activities with family, friends, neighbors, or groups? Not at all    During the past week, to what extent has your arm, shoulder or hand problem limited your work or other regular daily activities Not at all    Arm, shoulder, or hand pain. None  Tingling (pins and needles) in your arm, shoulder, or hand None    Difficulty Sleeping No difficulty    DASH Score 0 %               PATIENT EDUCATION:  Education details: Lymphedema risk reduction and post op shoulder/posture HEP Person educated: Patient Education method: Explanation, Demonstration, Handout Education comprehension: Patient verbalized understanding and returned demonstration  HOME EXERCISE PROGRAM: Patient was instructed today in a home exercise program today for post op shoulder range of motion. These included active assist shoulder flexion in sitting, scapular retraction, wall walking with shoulder abduction, and hands behind head external rotation.  She was encouraged to do these twice a day, holding 3 seconds and repeating 5 times when permitted by her physician.   ASSESSMENT:  CLINICAL IMPRESSION: Patient was diagnosed on 09/10/2023 with right grade 2 invasive ductal carcinoma breast cancer. It measures 8 mm and is located in the upper outer quadrant. It is triple positive with a Ki67 of 10%. Her multidisciplinary medical team met prior to her assessments to determine a recommended treatment plan. She is planning to have a right lumpectomy and sentinel node biopsy, radiation, and anti-estrogen therapy. She will benefit from a post op PT reassessment to determine needs and from L-Dex screens every 3 months for 2 years to detect subclinical lymphedema.  Pt will benefit from skilled therapeutic intervention to improve on the following deficits: Decreased knowledge of precautions, impaired UE functional use, pain, decreased ROM, postural dysfunction.   PT treatment/interventions: ADL/self-care home management, pt/family education, therapeutic exercise  REHAB POTENTIAL: Excellent  CLINICAL DECISION MAKING: Stable/uncomplicated  EVALUATION COMPLEXITY: Low   GOALS: Goals reviewed with patient? YES  LONG TERM GOALS: (STG=LTG)    Name Target Date Goal status  1 Pt will be able to verbalize understanding of pertinent lymphedema risk reduction practices relevant to her dx specifically related to  skin care.  Baseline:  No knowledge 09/22/2023 Achieved at eval  2 Pt will be able to return demo and/or verbalize understanding of the post op HEP related to regaining shoulder ROM. Baseline:  No knowledge 09/22/2023 Achieved at eval  3 Pt will be able to verbalize understanding of the importance of attending the post op After Breast CA Class for further lymphedema risk reduction education and therapeutic exercise.  Baseline:  No knowledge 09/22/2023 Achieved at eval  4 Pt will demo she has regained full shoulder ROM and function post operatively compared to baselines.  Baseline: See objective measurements taken today. 11/17/2023     PLAN:  PT FREQUENCY/DURATION: EVAL and 1 follow up appointment.   PLAN FOR NEXT SESSION: will reassess 3-4 weeks post op to determine needs.   Patient will follow up at outpatient cancer rehab 3-4 weeks following surgery.  If the patient requires physical therapy at that time, a specific plan will be dictated and sent to the referring physician for approval. The patient was educated today on appropriate basic range of motion exercises to begin post operatively and the importance of attending the After Breast Cancer class following surgery.  Patient was educated today on lymphedema risk reduction practices as it pertains to recommendations that will benefit the patient immediately following surgery.  She verbalized good understanding.    Physical Therapy Information for After Breast Cancer Surgery/Treatment:  Lymphedema is a swelling condition that you may be at risk for in your arm if you have lymph nodes removed from the armpit area.  After a sentinel node biopsy, the risk is  approximately 5-9% and is higher after an axillary node dissection.  There is treatment available for this condition and it is not life-threatening.  Contact your physician or physical therapist with concerns. You may begin the 4 shoulder/posture exercises (see additional sheet) when  permitted by your physician (typically a week after surgery).  If you have drains, you may need to wait until those are removed before beginning range of motion exercises.  A general recommendation is to not lift your arms above shoulder height until drains are removed.  These exercises should be done to your tolerance and gently.  This is not a "no pain/no gain" type of recovery so listen to your body and stretch into the range of motion that you can tolerate, stopping if you have pain.  If you are having immediate reconstruction, ask your plastic surgeon about doing exercises as he or she may want you to wait. We encourage you to attend the free one time ABC (After Breast Cancer) class offered by [facility].  You will learn information related to lymphedema risk, prevention and treatment and additional exercises to regain mobility following surgery.  You can call [phone] for more information.  This is offered the 1st and 3rd Monday of each month.  You only attend the class one time. While undergoing any medical procedure or treatment, try to avoid blood pressure being taken or needle sticks from occurring on the arm on the side of cancer.   This recommendation begins after surgery and continues for the rest of your life.  This may help reduce your risk of getting lymphedema (swelling in your arm). An excellent resource for those seeking information on lymphedema is the National Lymphedema Network's web site. It can be accessed at www.lymphnet.org If you notice swelling in your hand, arm or breast at any time following surgery (even if it is many years from now), please contact your doctor or physical therapist to discuss this.  Lymphedema can be treated at any time but it is easier for you if it is treated early on.  If you feel like your shoulder motion is not returning to normal in a reasonable amount of time, please contact your surgeon or physical therapist.  [facility] [phone]. [address], [address], [place] [address]  ABC CLASS After Breast Cancer Class  After Breast Cancer Class is a specially designed exercise class to assist you in a safe recover after having breast cancer surgery.  In this class you will learn how to get back to full function whether your drains were just removed or if you had surgery a month ago.  This one-time class is held the 1st and 3rd Monday of every month from 11:00 a.m. until 12:00 noon virtually.  This class is FREE and space is limited. For more information or to register for the next available class, call [phone].  Class Goals  Understand specific stretches to improve the flexibility of you chest and shoulder. Learn ways to safely strengthen your upper body and improve your posture. Understand the warning signs of infection and why you may be at risk for an arm infection. Learn about Lymphedema and prevention.  ** You do not attend this class until after surgery.  Drains must be removed to participate  Patient was instructed today in a home exercise program today for post op shoulder range of motion. These included active assist shoulder flexion in sitting, scapular retraction, wall walking with  shoulder abduction, and hands behind head external rotation.  She was encouraged to do these twice a day, holding 3 seconds and repeating 5 times when permitted by her physician.  Bethann Punches, [place] 09/22/23 3:15 PM

## 2023-09-23 ENCOUNTER — Encounter: Payer: Self-pay | Admitting: *Deleted

## 2023-09-24 ENCOUNTER — Telehealth: Payer: Self-pay | Admitting: *Deleted

## 2023-09-24 ENCOUNTER — Encounter: Payer: Self-pay | Admitting: Genetic Counselor

## 2023-09-24 ENCOUNTER — Encounter: Payer: Self-pay | Admitting: *Deleted

## 2023-09-24 NOTE — Progress Notes (Signed)
REFERRING PROVIDER: Serena Croissant, MD  PRIMARY PROVIDER:  Margaree Mackintosh, MD [address] [place],  [place] [address]  PRIMARY REASON FOR VISIT:  1. Malignant neoplasm of upper-outer quadrant of right breast in female, estrogen receptor positive (HCC)   2. Family history of breast cancer   3. Family history of pancreatic cancer    HISTORY OF PRESENT ILLNESS:   Ms. Rotan, a 62 y.o. female, was seen for a [facility] cancer genetics consultation during the breast multidisciplinary clinic at the request of Dr. Pamelia Hoit due to a personal and family history of cancer.  Ms. Bystrom presents to clinic today to discuss the possibility of a hereditary predisposition to cancer, to discuss genetic testing, and to further clarify her future cancer risks, as well as potential cancer risks for family members.   In December 2024, at the age of 44, Ms. Reeve was diagnosed with invasive ductal carcinoma of the right breast (ER/PR/HER2 positive). The treatment plan includes a lumpectomy followed by radiation and anti-estrogen therapy.   CANCER HISTORY:  Oncology History  Malignant neoplasm of upper-outer quadrant of right breast in female, estrogen receptor positive (HCC)  09/10/2023 Initial Diagnosis   Screening mammogram detected right breast mass UOQ: 0.8 cm at 10:00, axilla negative, biopsy: Grade 2 IDC ER 100%, PR 80%, Ki67 10%, HER2 positive by FISH ratio 3.26 copy #7    09/21/2023 Cancer Staging   Staging form: Breast, AJCC 8th Edition - Clinical stage from 09/21/2023: Stage IA (cT1b, cN0, cM0, G2, ER+, PR+, HER2+) - Signed by Ronny Bacon, PA-C on 09/21/2023 Stage prefix: Initial diagnosis Method of lymph node assessment: Clinical Histologic grading system: 3 grade system    Past Medical History:  Diagnosis Date   Breast cancer (HCC)    GERD (gastroesophageal reflux disease)    Hyperlipidemia    Thyroid disease    Vitamin D deficiency     Past Surgical History:   Procedure Laterality Date   COLONOSCOPY WITH PROPOFOL N/A 07/24/2014   Procedure: COLONOSCOPY WITH PROPOFOL;  Surgeon: Charolett Bumpers, MD;  Location: [facility];  Service: Endoscopy;  Laterality: N/A;   UPPER GASTROINTESTINAL ENDOSCOPY  08/2018    Social History   Socioeconomic History   Marital status: Married    Spouse name: Not on file   Number of children: 2   Years of education: Not on file   Highest education level: Not on file  Occupational History   Not on file  Tobacco Use   Smoking status: Former    Current packs/day: 0.00    Average packs/day: 0.1 packs/day for 4.0 years (0.4 ttl pk-yrs)    Types: Cigarettes    Start date: 05/05/1982    Quit date: 05/05/1986    Years since quitting: 37.4   Smokeless tobacco: Never  Vaping Use   Vaping status: Never Used  Substance and Sexual Activity   Alcohol use: Yes    Alcohol/week: 0.0 - 1.0 standard drinks of alcohol   Drug use: No   Sexual activity: Yes    Partners: Male    Birth control/protection: Post-menopausal  Other Topics Concern   Not on file  Social History Narrative   Not on file   Social Drivers of Health   Financial Resource Strain: Not on file  Food Insecurity: Not on file  Transportation Needs: Not on file  Physical Activity: Not on file  Stress: Not on file  Social Connections: Not on file     FAMILY HISTORY:  We obtained  a detailed, 4-generation family history.  Significant diagnoses are listed below: Family History  Problem Relation Age of Onset   Pancreatic cancer Maternal Aunt 2   Breast cancer Paternal Aunt 60 - 43   Breast cancer Cousin        maternal first cousin     Ms. Bristow is unaware of previous family history of genetic testing for hereditary cancer risks. There is no reported Ashkenazi Jewish ancestry.   GENETIC COUNSELING ASSESSMENT: Ms. Maglio is a 62 y.o. female with a personal and family history of cancer which is somewhat suggestive of a hereditary cancer syndrome  and predisposition to cancer. We, therefore, discussed and recommended the following at today's visit.   DISCUSSION: We discussed that 5 - 10% of cancer is hereditary, with most cases of hereditary breast cancer associated with mutations in BRCA1/2.  There are other genes that can be associated with hereditary breast and pancreatic cancer syndromes. Type of cancer risk and level of risk are gene-specific. We discussed that testing is beneficial for several reasons including knowing how to follow individuals after completing their treatment, identifying whether potential treatment options would be beneficial, and understanding if other family members could be at risk for cancer and allowing them to undergo genetic testing.   We reviewed the characteristics, features and inheritance patterns of hereditary cancer syndromes. We also discussed genetic testing, including the appropriate family members to test, the process of testing, insurance coverage and turn-around-time for results. We discussed the implications of a negative, positive and/or variant of uncertain significant result. In order to get genetic test results in a timely manner so that Ms. Gratton can use these genetic test results for surgical decisions, we recommended Ms. Ballo pursue genetic testing for the BRCAplus. Once complete, we recommend Ms. Gismondi pursue reflex genetic testing to a more comprehensive gene panel.   Ms. Samu elected to have [facility] CancerNext Panel. The [facility] CancerNext+RNAinsight Panel includes sequencing, rearrangement analysis, and RNA analysis for the following 39 genes: APC, ATM, BAP1, BARD1, BMPR1A, BRCA1, BRCA2, BRIP1, CDH1, CDKN2A, CHEK2, FH, FLCN, MET, MLH1, MSH2, MSH6, MUTYH, NF1, NTHL1, PALB2, PMS2, PTEN, RAD51C, RAD51D, SMAD4, STK11, TP53, TSC1, TSC2, and VHL (sequencing and deletion/duplication); AXIN2, HOXB13, MBD4, MSH3, POLD1 and POLE (sequencing only); EPCAM and GREM1 (deletion/duplication only).   Based  on Ms. Legaspi's personal and family history of cancer, she meets medical criteria for genetic testing. Despite that she meets criteria, she may still have an out of pocket cost. We discussed that if her out of pocket cost for testing is over $100, the laboratory should contact them to discuss self-pay prices, patient pay assistance programs, if applicable, and other billing options.   PLAN: After considering the risks, benefits, and limitations, Ms. Dewey provided informed consent to pursue genetic testing and the blood sample was sent to [facility] for analysis of the CancerNext Panel. Results should be available within approximately 1-2 weeks' time, at which point they will be disclosed by telephone to Ms. Ottey, as will any additional recommendations warranted by these results. Ms. Reliford will receive a summary of her genetic counseling visit and a copy of her results once available. This information will also be available in Epic.   Ms. Resende's questions were answered to her satisfaction today. Our contact information was provided should additional questions or concerns arise. Thank you for the referral and allowing [place] to share in the care of your patient.   Lalla Brothers, MS, [facility] Genetic Counselor [email or web address] (P) [phone]  The patient was seen for a total of 20 minutes in face-to-face genetic counseling.  The patient brought her husband and daughter. Drs. Pamelia Hoit and/or Mosetta Putt were available to discuss this case as needed.  _______________________________________________________________________ For Office Staff:  Number of people involved in session: 3 Was an Intern/ student involved with case: no

## 2023-09-24 NOTE — Telephone Encounter (Signed)
Spoke to pt concerning [facility] from 09/22/23. Denies questions or concerns regarding dx or treatment care plan. Encourage pt to call with needs. Received verbal understanding.

## 2023-09-27 ENCOUNTER — Encounter: Payer: Self-pay | Admitting: *Deleted

## 2023-09-28 ENCOUNTER — Telehealth: Payer: Self-pay | Admitting: Hematology and Oncology

## 2023-09-28 NOTE — Telephone Encounter (Signed)
Patient is aware of scheduled appointment times/dates

## 2023-10-01 ENCOUNTER — Telehealth: Payer: Self-pay | Admitting: Genetic Counselor

## 2023-10-01 ENCOUNTER — Encounter: Payer: Self-pay | Admitting: Genetic Counselor

## 2023-10-01 DIAGNOSIS — Z1379 Encounter for other screening for genetic and chromosomal anomalies: Secondary | ICD-10-CM | POA: Insufficient documentation

## 2023-10-01 NOTE — Telephone Encounter (Signed)
I contacted Ms. Bushard to discuss her genetic testing results. No pathogenic variants were identified in the first 13 genes analyzed. Of note, we are still on the pan-cancer panel. Detailed clinic note to follow.  The test report has been scanned into EPIC and is located under the Molecular Pathology section of the Results Review tab.  A portion of the result report is included below for reference.   Lalla Brothers, MS, [facility] Genetic Counselor [email or web address] (P) [phone]

## 2023-10-05 ENCOUNTER — Other Ambulatory Visit: Payer: Self-pay

## 2023-10-05 ENCOUNTER — Encounter (HOSPITAL_BASED_OUTPATIENT_CLINIC_OR_DEPARTMENT_OTHER): Payer: Self-pay | Admitting: General Surgery

## 2023-10-11 ENCOUNTER — Encounter: Payer: Self-pay | Admitting: General Practice

## 2023-10-11 NOTE — Progress Notes (Signed)
 [facility] Multidisciplinary Clinic Spiritual Care Note  Met with Christie Molina by phone following Breast Multidisciplinary Clinic to introduce Support Center team/resources.  she completed SDOH screening; results follow below.    SDOH Screenings   Food Insecurity: No Food Insecurity (10/11/2023)  Housing: Unknown (10/11/2023)  Transportation Needs: No Transportation Needs (10/11/2023)  Utilities: Not At Risk (10/11/2023)  Depression (PHQ2-9): Low Risk  (02/25/2023)  Tobacco Use: Medium Risk (10/05/2023)    Chaplain and patient discussed common feelings and emotions when being diagnosed with cancer, and the importance of support during treatment.  Chaplain informed patient of the support team and support services at [facility].  Chaplain provided contact information and encouraged patient to call with any questions or concerns.  Follow up needed: No. [name] is aware of ongoing Spiritual Care and support services programming availability. She plans to phone as needed/desired.   [address] Olam Corrigan, [place], [facility] Pager [phone] Voicemail [phone]

## 2023-10-12 MED ORDER — CHLORHEXIDINE GLUCONATE CLOTH 2 % EX PADS: 6.0000 | MEDICATED_PAD | Freq: Once | CUTANEOUS | Status: DC

## 2023-10-12 NOTE — Progress Notes (Signed)

## 2023-10-13 ENCOUNTER — Ambulatory Visit (HOSPITAL_BASED_OUTPATIENT_CLINIC_OR_DEPARTMENT_OTHER): Payer: 59 | Admitting: Anesthesiology

## 2023-10-13 ENCOUNTER — Other Ambulatory Visit: Payer: Self-pay

## 2023-10-13 ENCOUNTER — Encounter (HOSPITAL_BASED_OUTPATIENT_CLINIC_OR_DEPARTMENT_OTHER): Payer: Self-pay | Admitting: General Surgery

## 2023-10-13 ENCOUNTER — Ambulatory Visit (HOSPITAL_BASED_OUTPATIENT_CLINIC_OR_DEPARTMENT_OTHER)
Admission: RE | Admit: 2023-10-13 | Discharge: 2023-10-13 | Disposition: A | Discharge status: Home / Self Care | Payer: 59 | Attending: General Surgery | Admitting: General Surgery

## 2023-10-13 ENCOUNTER — Encounter (HOSPITAL_BASED_OUTPATIENT_CLINIC_OR_DEPARTMENT_OTHER)
Admission: RE | Disposition: A | Discharge status: Home / Self Care | Payer: Self-pay | Source: Home / Self Care | Attending: General Surgery

## 2023-10-13 DIAGNOSIS — Z87891 Personal history of nicotine dependence: Secondary | ICD-10-CM | POA: Insufficient documentation

## 2023-10-13 DIAGNOSIS — Z170 Estrogen receptor positive status [ER+]: Secondary | ICD-10-CM | POA: Insufficient documentation

## 2023-10-13 DIAGNOSIS — Z79890 Hormone replacement therapy (postmenopausal): Secondary | ICD-10-CM | POA: Insufficient documentation

## 2023-10-13 DIAGNOSIS — K219 Gastro-esophageal reflux disease without esophagitis: Secondary | ICD-10-CM | POA: Diagnosis not present

## 2023-10-13 DIAGNOSIS — E785 Hyperlipidemia, unspecified: Secondary | ICD-10-CM | POA: Diagnosis not present

## 2023-10-13 DIAGNOSIS — C50911 Malignant neoplasm of unspecified site of right female breast: Secondary | ICD-10-CM | POA: Diagnosis present

## 2023-10-13 DIAGNOSIS — Z01818 Encounter for other preprocedural examination: Secondary | ICD-10-CM

## 2023-10-13 DIAGNOSIS — E039 Hypothyroidism, unspecified: Secondary | ICD-10-CM | POA: Diagnosis not present

## 2023-10-13 DIAGNOSIS — Z1721 Progesterone receptor positive status: Secondary | ICD-10-CM | POA: Insufficient documentation

## 2023-10-13 HISTORY — PX: BREAST LUMPECTOMY WITH RADIOACTIVE SEED AND SENTINEL LYMPH NODE BIOPSY: SHX6550

## 2023-10-13 HISTORY — DX: Hypothyroidism, unspecified: E03.9

## 2023-10-13 SURGERY — BREAST LUMPECTOMY WITH RADIOACTIVE SEED AND SENTINEL LYMPH NODE BIOPSY
Anesthesia: General | Site: Breast | Laterality: Right

## 2023-10-13 MED ORDER — DROPERIDOL 2.5 MG/ML IJ SOLN: 0.6250 mg | Freq: Once | INTRAMUSCULAR | Status: DC | PRN

## 2023-10-13 MED ORDER — ACETAMINOPHEN 500 MG PO TABS
1000.0000 mg | ORAL_TABLET | ORAL | Status: AC
  Administered 2023-10-13: 1000 mg via ORAL

## 2023-10-13 MED ORDER — MIDAZOLAM HCL 5 MG/5ML IJ SOLN
INTRAMUSCULAR | Status: DC | PRN
  Administered 2023-10-13: 2 mg via INTRAVENOUS

## 2023-10-13 MED ORDER — OXYCODONE HCL 5 MG PO TABS: 5.0000 mg | ORAL_TABLET | Freq: Four times a day (QID) | ORAL | 0 refills | Status: DC | PRN

## 2023-10-13 MED ORDER — FENTANYL CITRATE (PF) 100 MCG/2ML IJ SOLN
25.0000 ug | INTRAMUSCULAR | Status: DC | PRN
  Administered 2023-10-13: 25 ug via INTRAVENOUS

## 2023-10-13 MED ORDER — ACETAMINOPHEN 500 MG PO TABS
ORAL_TABLET | ORAL | Status: AC
Start: 2023-10-13 — End: ?
  Filled 2023-10-13: qty 2

## 2023-10-13 MED ORDER — BUPIVACAINE-EPINEPHRINE 0.25% -1:200000 IJ SOLN
INTRAMUSCULAR | Status: DC | PRN
  Administered 2023-10-13: 20 mL

## 2023-10-13 MED ORDER — MIDAZOLAM HCL 2 MG/2ML IJ SOLN
INTRAMUSCULAR | Status: AC
  Filled 2023-10-13: qty 2

## 2023-10-13 MED ORDER — PROPOFOL 10 MG/ML IV BOLUS
INTRAVENOUS | Status: AC
  Filled 2023-10-13: qty 20

## 2023-10-13 MED ORDER — OXYCODONE HCL 5 MG/5ML PO SOLN: 5.0000 mg | Freq: Once | ORAL | Status: AC | PRN

## 2023-10-13 MED ORDER — FENTANYL CITRATE (PF) 100 MCG/2ML IJ SOLN
INTRAMUSCULAR | Status: AC
  Filled 2023-10-13: qty 2

## 2023-10-13 MED ORDER — PHENYLEPHRINE 80 MCG/ML (10ML) SYRINGE FOR IV PUSH (FOR BLOOD PRESSURE SUPPORT)
PREFILLED_SYRINGE | INTRAVENOUS | Status: DC | PRN
  Administered 2023-10-13: 80 ug via INTRAVENOUS
  Administered 2023-10-13: 160 ug via INTRAVENOUS

## 2023-10-13 MED ORDER — ONDANSETRON HCL 4 MG/2ML IJ SOLN
INTRAMUSCULAR | Status: DC | PRN
  Administered 2023-10-13: 4 mg via INTRAVENOUS

## 2023-10-13 MED ORDER — 0.9 % SODIUM CHLORIDE (POUR BTL) OPTIME
TOPICAL | Status: DC | PRN
  Administered 2023-10-13: 500 mL

## 2023-10-13 MED ORDER — DEXAMETHASONE SODIUM PHOSPHATE 10 MG/ML IJ SOLN
INTRAMUSCULAR | Status: AC
Start: 2023-10-13 — End: ?
  Filled 2023-10-13: qty 1

## 2023-10-13 MED ORDER — OXYCODONE HCL 5 MG PO TABS
ORAL_TABLET | ORAL | Status: AC
  Filled 2023-10-13: qty 1

## 2023-10-13 MED ORDER — MIDAZOLAM HCL 2 MG/2ML IJ SOLN
2.0000 mg | Freq: Once | INTRAMUSCULAR | Status: AC
  Administered 2023-10-13: 1 mg via INTRAVENOUS

## 2023-10-13 MED ORDER — ONDANSETRON HCL 4 MG/2ML IJ SOLN: 4.0000 mg | Freq: Once | INTRAMUSCULAR | Status: DC | PRN

## 2023-10-13 MED ORDER — GABAPENTIN 100 MG PO CAPS
ORAL_CAPSULE | ORAL | Status: AC
  Filled 2023-10-13: qty 1

## 2023-10-13 MED ORDER — DEXAMETHASONE SODIUM PHOSPHATE 10 MG/ML IJ SOLN
INTRAMUSCULAR | Status: DC | PRN
  Administered 2023-10-13: 10 mg via INTRAVENOUS

## 2023-10-13 MED ORDER — BUPIVACAINE LIPOSOME 1.3 % IJ SUSP
INTRAMUSCULAR | Status: DC | PRN
  Administered 2023-10-13: 10 mL via PERINEURAL

## 2023-10-13 MED ORDER — OXYCODONE HCL 5 MG PO TABS
5.0000 mg | ORAL_TABLET | Freq: Once | ORAL | Status: AC | PRN
  Administered 2023-10-13: 5 mg via ORAL

## 2023-10-13 MED ORDER — MAGTRACE LYMPHATIC TRACER
INTRAMUSCULAR | Status: DC | PRN
  Administered 2023-10-13: 2 mL via INTRAMUSCULAR

## 2023-10-13 MED ORDER — BUPIVACAINE HCL (PF) 0.5 % IJ SOLN
INTRAMUSCULAR | Status: DC | PRN
  Administered 2023-10-13: 30 mL via PERINEURAL

## 2023-10-13 MED ORDER — FENTANYL CITRATE (PF) 250 MCG/5ML IJ SOLN
INTRAMUSCULAR | Status: DC | PRN
  Administered 2023-10-13 (×2): 50 ug via INTRAVENOUS

## 2023-10-13 MED ORDER — SCOPOLAMINE 1 MG/3DAYS TD PT72: 1.0000 | MEDICATED_PATCH | TRANSDERMAL | Status: DC

## 2023-10-13 MED ORDER — FENTANYL CITRATE (PF) 100 MCG/2ML IJ SOLN
100.0000 ug | Freq: Once | INTRAMUSCULAR | Status: AC
  Administered 2023-10-13: 50 ug via INTRAVENOUS

## 2023-10-13 MED ORDER — GABAPENTIN 100 MG PO CAPS
100.0000 mg | ORAL_CAPSULE | ORAL | Status: AC
  Administered 2023-10-13: 100 mg via ORAL

## 2023-10-13 MED ORDER — LIDOCAINE 2% (20 MG/ML) 5 ML SYRINGE
INTRAMUSCULAR | Status: AC
  Filled 2023-10-13: qty 5

## 2023-10-13 MED ORDER — CEFAZOLIN SODIUM-DEXTROSE 2-4 GM/100ML-% IV SOLN
2.0000 g | INTRAVENOUS | Status: AC
  Administered 2023-10-13: 2 g via INTRAVENOUS

## 2023-10-13 MED ORDER — LACTATED RINGERS IV SOLN: INTRAVENOUS | Status: DC

## 2023-10-13 MED ORDER — PROPOFOL 10 MG/ML IV BOLUS
INTRAVENOUS | Status: DC | PRN
  Administered 2023-10-13: 150 mg via INTRAVENOUS

## 2023-10-13 MED ORDER — ONDANSETRON HCL 4 MG/2ML IJ SOLN
INTRAMUSCULAR | Status: AC
Start: 2023-10-13 — End: ?
  Filled 2023-10-13: qty 2

## 2023-10-13 MED ORDER — CEFAZOLIN SODIUM-DEXTROSE 2-4 GM/100ML-% IV SOLN
INTRAVENOUS | Status: AC
  Filled 2023-10-13: qty 100

## 2023-10-13 MED ORDER — LIDOCAINE 2% (20 MG/ML) 5 ML SYRINGE
INTRAMUSCULAR | Status: DC | PRN
  Administered 2023-10-13: 60 mg via INTRAVENOUS

## 2023-10-13 SURGICAL SUPPLY — 38 items
APPLIER CLIP 9.375 MED OPEN (MISCELLANEOUS) ×1
BLADE SURG 15 STRL LF DISP TIS (BLADE) ×2 IMPLANT
CANISTER SUC SOCK COL 7IN (MISCELLANEOUS) IMPLANT
CANISTER SUCT 1200ML W/VALVE (MISCELLANEOUS) IMPLANT
CHLORAPREP W/TINT 26 (MISCELLANEOUS) ×2 IMPLANT
CLIP APPLIE 9.375 MED OPEN (MISCELLANEOUS) ×2 IMPLANT
COVER BACK TABLE 60X90IN (DRAPES) ×2 IMPLANT
COVER MAYO STAND STRL (DRAPES) ×2 IMPLANT
COVER PROBE CYLINDRICAL 5X96 (MISCELLANEOUS) ×2 IMPLANT
DERMABOND ADVANCED .7 DNX12 (GAUZE/BANDAGES/DRESSINGS) ×2 IMPLANT
DRAPE LAPAROSCOPIC ABDOMINAL (DRAPES) ×2 IMPLANT
DRAPE UTILITY XL STRL (DRAPES) ×2 IMPLANT
ELECT COATED BLADE 2.86 ST (ELECTRODE) ×2 IMPLANT
ELECT REM PT RETURN 9FT ADLT (ELECTROSURGICAL) ×1
ELECTRODE REM PT RTRN 9FT ADLT (ELECTROSURGICAL) ×2 IMPLANT
GLOVE BIO SURGEON STRL SZ 6.5 (GLOVE) IMPLANT
GLOVE BIO SURGEON STRL SZ7.5 (GLOVE) ×2 IMPLANT
GLOVE BIOGEL PI IND STRL 7.0 (GLOVE) IMPLANT
GOWN STRL REUS W/ TWL LRG LVL3 (GOWN DISPOSABLE) ×4 IMPLANT
KIT MARKER MARGIN INK (KITS) ×2 IMPLANT
NDL HYPO 25X1 1.5 SAFETY (NEEDLE) ×2 IMPLANT
NDL SAFETY ECLIPSE 18X1.5 (NEEDLE) IMPLANT
NEEDLE HYPO 25X1 1.5 SAFETY (NEEDLE) ×2
NS IRRIG 1000ML POUR BTL (IV SOLUTION) IMPLANT
PACK BASIN DAY SURGERY FS (CUSTOM PROCEDURE TRAY) ×2 IMPLANT
PENCIL SMOKE EVACUATOR (MISCELLANEOUS) ×2 IMPLANT
SLEEVE SCD COMPRESS KNEE MED (STOCKING) ×2 IMPLANT
SPIKE FLUID TRANSFER (MISCELLANEOUS) IMPLANT
SPONGE T-LAP 18X18 ~~LOC~~+RFID (SPONGE) ×2 IMPLANT
SUT MON AB 4-0 PC3 18 (SUTURE) ×4 IMPLANT
SUT SILK 2 0 SH (SUTURE) IMPLANT
SUT VICRYL 3-0 CR8 SH (SUTURE) ×2 IMPLANT
SYR CONTROL 10ML LL (SYRINGE) ×2 IMPLANT
TOWEL GREEN STERILE FF (TOWEL DISPOSABLE) ×2 IMPLANT
TRACER MAGTRACE VIAL (MISCELLANEOUS) IMPLANT
TRAY FAXITRON CT DISP (TRAY / TRAY PROCEDURE) ×2 IMPLANT
TUBE CONNECTING 20X1/4 (TUBING) IMPLANT
YANKAUER SUCT BULB TIP NO VENT (SUCTIONS) IMPLANT

## 2023-10-13 NOTE — Anesthesia Procedure Notes (Signed)
 Procedure Name: LMA Insertion Date/Time: 10/13/2023 11:21 AM  Performed by: Denton Niels CROME, CRNAPre-anesthesia Checklist: Patient identified, Emergency Drugs available, Suction available, Patient being monitored and Timeout performed Patient Re-evaluated:Patient Re-evaluated prior to induction Oxygen Delivery Method: Circle system utilized Preoxygenation: Pre-oxygenation with 100% oxygen Induction Type: IV induction Ventilation: Mask ventilation without difficulty LMA: LMA inserted LMA Size: 4.0 Number of attempts: 1 Tube secured with: Tape Dental Injury: Teeth and Oropharynx as per pre-operative assessment

## 2023-10-13 NOTE — Discharge Instructions (Signed)
 No Tylenol  before 3:30pm today.   Post Anesthesia Home Care Instructions  Activity: Get plenty of rest for the remainder of the day. A responsible individual must stay with you for 24 hours following the procedure.  For the next 24 hours, DO NOT: -Drive a car -Advertising copywriter -Drink alcoholic beverages -Take any medication unless instructed by your physician -Make any legal decisions or sign important papers.  Meals: Start with liquid foods such as gelatin or soup. Progress to regular foods as tolerated. Avoid greasy, spicy, heavy foods. If nausea and/or vomiting occur, drink only clear liquids until the nausea and/or vomiting subsides. Call your physician if vomiting continues.  Special Instructions/Symptoms: Your throat may feel dry or sore from the anesthesia or the breathing tube placed in your throat during surgery. If this causes discomfort, gargle with warm salt water. The discomfort should disappear within 24 hours.  If you had a scopolamine  patch placed behind your ear for the management of post- operative nausea and/or vomiting:  1. The medication in the patch is effective for 72 hours, after which it should be removed.  Wrap patch in a tissue and discard in the trash. Wash hands thoroughly with soap and water. 2. You may remove the patch earlier than 72 hours if you experience unpleasant side effects which may include dry mouth, dizziness or visual disturbances. 3. Avoid touching the patch. Wash your hands with soap and water after contact with the patch.   Information for Discharge Teaching: EXPAREL  (bupivacaine  liposome injectable suspension)   Pain relief is important to your recovery. The goal is to control your pain so you can move easier and return to your normal activities as soon as possible after your procedure. Your physician may use several types of medicines to manage pain, swelling, and more.  Your surgeon or anesthesiologist gave you EXPAREL (bupivacaine ) to  help control your pain after surgery.  EXPAREL  is a local anesthetic designed to release slowly over an extended period of time to provide pain relief by numbing the tissue around the surgical site. EXPAREL  is designed to release pain medication over time and can control pain for up to 72 hours. Depending on how you respond to EXPAREL , you may require less pain medication during your recovery. EXPAREL  can help reduce or eliminate the need for opioids during the first few days after surgery when pain relief is needed the most. EXPAREL  is not an opioid and is not addictive. It does not cause sleepiness or sedation.   Important! A teal colored band has been placed on your arm with the date, time and amount of EXPAREL  you have received. Please leave this armband in place for the full 96 hours following administration, and then you may remove the band. If you return to the hospital for any reason within 96 hours following the administration of EXPAREL , the armband provides important information that your health care providers to know, and alerts them that you have received this anesthetic.    Possible side effects of EXPAREL : Temporary loss of sensation or ability to move in the area where medication was injected. Nausea, vomiting, constipation Rarely, numbness and tingling in your mouth or lips, lightheadedness, or anxiety may occur. Call your doctor right away if you think you may be experiencing any of these sensations, or if you have other questions regarding possible side effects.  Follow all other discharge instructions given to you by your surgeon or nurse. Eat a healthy diet and drink plenty of water or other fluids.

## 2023-10-13 NOTE — Progress Notes (Signed)
Assisted Dr. Royce Macadamia with right, pectoralis block. Side rails up, monitors on throughout procedure. See vital signs in flow sheet. Tolerated Procedure well.

## 2023-10-13 NOTE — Op Note (Signed)
 10/13/2023  12:39 PM  PATIENT:  Christie Molina  63 y.o. female  PRE-OPERATIVE DIAGNOSIS:  RIGHT BREAST CANCER  POST-OPERATIVE DIAGNOSIS:  RIGHT BREAST CANCER  PROCEDURE:  Procedure(s): RIGHT BREAST LUMPECTOMY WITH RADIOACTIVE SEED LOCALIZATION AND DEEP RIGHT AXILLARY SENTINEL LYMPH NODE BIOPSY (Right)  SURGEON:  Surgeons and Role:    * Curvin Deward MOULD, MD - Primary  PHYSICIAN ASSISTANT:   ASSISTANTS: none   ANESTHESIA:   local and general  EBL:  minimal   BLOOD ADMINISTERED:none  DRAINS: none   LOCAL MEDICATIONS USED:  MARCAINE      SPECIMEN:  Source of Specimen:  right breast tissue and sentinel node x 2  DISPOSITION OF SPECIMEN:  PATHOLOGY  COUNTS:  YES  TOURNIQUET:  * No tourniquets in log *  DICTATION: .Dragon Dictation  After informed consent was obtained the patient was brought to the operating room and placed in the supine position on the operating table.  After adequate induction of general anesthesia the patient's right chest, breast, and axillary area were prepped with ChloraPrep, allowed to dry, and draped in usual sterile manner.  An appropriate timeout was performed.  2 cc of mag trace were then injected into the subareolar position on the right breast and the breast was massaged for several minutes.  The Sentimag was then used to identify a signal in the right axilla.  The area overlying this was infiltrated with quarter percent Marcaine .  A small transversely oriented incision was made in the right axilla with a 15 blade knife overlying the area of signal.  The incision was carried through the skin and subcutaneous tissue sharply with the electrocautery.  Dissection was then carried into the deep right axillary space under the direction of the Sentimag.  I was able to identify 2 lymph nodes with signal.  Each of these was excised sharply with the electrocautery and the surrounding small vessels and lymphatics were controlled with clips.  These were sent as sentinel  nodes numbers 1 and 2.  No other hot or palpable nodes were identified in the right axilla.  Hemostasis was achieved using the Bovie electrocautery.  The deep layer of the incision was then closed with interrupted 3-0 Vicryl stitches.  The skin was closed with a running 4-0 Monocryl subcuticular stitch.  Attention was then turned to the right breast.  The neoprobe was set to I-125 in the area of radioactivity was readily identified.  The area around this was infiltrated with quarter percent Marcaine .  A curvilinear incision was made along the upper outer edge of the areola of the right breast with a 15 blade knife.  The incision was carried through the skin and subcutaneous tissue sharply with the electrocautery.  Dissection was then carried towards the radioactive seed under the direction of the neoprobe.  Once I more closely approach the radioactive seed I then removed a circular portion of breast tissue sharply with the electrocautery around the radioactive seed while checking the area of radioactivity frequently.  Once the tissue was removed it was oriented with the appropriate paint colors.  A specimen radiograph was obtained that showed the clip and seed to be near the center of the specimen.  The specimen was then sent to pathology for further evaluation.  Hemostasis was achieved using the Bovie electrocautery.  The wound was irrigated with saline and infiltrated with more quarter percent Marcaine .  The cavity was marked with clips.  The deep layer of the incision was then closed with layers of  interrupted 3-0 Vicryl stitches.  The skin was closed with interrupted 4-0 Monocryl subcuticular stitches.  Dermabond dressings were applied.  The patient tolerated the procedure well.  At the end of the case all needle sponge and instrument counts were correct.  The patient was then awakened and taken to recovery in stable condition.  PLAN OF CARE: Discharge to home after PACU  PATIENT DISPOSITION:  PACU -  hemodynamically stable.   Delay start of Pharmacological VTE agent (>24hrs) due to surgical blood loss or risk of bleeding: not applicable

## 2023-10-13 NOTE — Interval H&P Note (Signed)
 History and Physical Interval Note:  10/13/2023 11:00 AM  Christie Molina  has presented today for surgery, with the diagnosis of RIGHT BREAST CANCER.  The various methods of treatment have been discussed with the patient and family. After consideration of risks, benefits and other options for treatment, the patient has consented to  Procedure(s): RIGHT BREAST LUMPECTOMY WITH RADIOACTIVE SEED AND SENTINEL LYMPH NODE BIOPSY (Right) as a surgical intervention.  The patient's history has been reviewed, patient examined, no change in status, stable for surgery.  I have reviewed the patient's chart and labs.  Questions were answered to the patient's satisfaction.     Deward Null III

## 2023-10-13 NOTE — Anesthesia Procedure Notes (Signed)
 Anesthesia Regional Block: Pectoralis block   Pre-Anesthetic Checklist: , timeout performed,  Correct Patient, Correct Site, Correct Laterality,  Correct Procedure, Correct Position, site marked,  Risks and benefits discussed,  Surgical consent,  Pre-op evaluation,  At surgeon's request and post-op pain management  Laterality: Right  Prep: chloraprep       Needles:  Injection technique: Single-shot  Needle Type: Echogenic Stimulator Needle     Needle Length: 10cm  Needle Gauge: 21   Needle insertion depth: 6 cm   Additional Needles:   Procedures:,,,, ultrasound used (permanent image in chart),,    Narrative:  Start time: 10/13/2023 10:43 AM End time: 10/13/2023 10:48 AM Injection made incrementally with aspirations every 5 mL.  Performed by: Personally  Anesthesiologist: Jerrye Sharper, MD  Additional Notes: Timeout performed. Patient sedated. Relevant anatomy ID'd using US . Incremental 2-5ml injection of LA with frequent aspiration. Patient tolerated procedure well.

## 2023-10-13 NOTE — H&P (Signed)
 REFERRING PHYSICIAN: Gudena, Vinay K, MD PROVIDER: DEWARD GARNETTE NULL, MD MRN: [id number] DOB: 1960/12/09 Subjective   Chief Complaint: Breast Cancer  History of Present Illness: Christie Molina is a 63 y.o. female who is seen today as an office consultation for evaluation of Breast Cancer  We are asked to see the patient in consultation by Dr. Odean to evaluate her for a new right breast cancer. The patient is a 63 year old white female who recently went for a routine screening mammogram. At that time she was found to have an 8 mm mass in the upper outer quadrant of the right breast. The axilla looked normal. The mass was biopsied and came back as a grade 2 invasive ductal cancer that was triple positive with a Ki-67 of 10%. She is otherwise in good health and does not smoke.  Review of Systems: A complete review of systems was obtained from the patient. I have reviewed this information and discussed as appropriate with the patient. See HPI as well for other ROS.  ROS   Medical History: Past Medical History:  Diagnosis Date  GERD (gastroesophageal reflux disease)  History of cancer  Hyperlipidemia  Thyroid disease   There is no problem list on file for this patient.  Past Surgical History:  Procedure Laterality Date  COLONOSCOPY  Gi endo    No Known Allergies  Current Outpatient Medications on File Prior to Visit  Medication Sig Dispense Refill  cetirizine (ZYRTEC) 10 MG tablet Take 10 mg by mouth once daily  cholecalciferol, vitamin D3, (VITAMIN D3 ORAL) Take by mouth  famotidine (PEPCID) 20 MG tablet Take 20 mg by mouth once daily  hyoscyamine  (LEVSIN /SL) 0.125 mg SL tablet Place 1 tablet under the tongue  levothyroxine  (SYNTHROID ) 75 MCG tablet TAKE 1 TABLET(75 MCG) BY MOUTH DAILY  meloxicam  (MOBIC ) 7.5 MG tablet TAKE 1 TO 2 TABLETS BY MOUTH DAILY WITH A MEAL.  simvastatin  (ZOCOR ) 20 MG tablet Take 20 mg by mouth every evening   No current facility-administered  medications on file prior to visit.   Family History  Problem Relation Age of Onset  Hyperlipidemia (Elevated cholesterol) Mother    Social History   Tobacco Use  Smoking Status Former  Types: Cigarettes  Smokeless Tobacco Never    Social History   Socioeconomic History  Marital status: Married  Tobacco Use  Smoking status: Former  Types: Cigarettes  Smokeless tobacco: Never   Objective:  There were no vitals filed for this visit.  There is no height or weight on file to calculate BMI.  Physical Exam Vitals reviewed.  Constitutional:  General: She is not in acute distress. Appearance: Normal appearance.  HENT:  Head: Normocephalic and atraumatic.  Right Ear: External ear normal.  Left Ear: External ear normal.  Nose: Nose normal.  Mouth/Throat:  Mouth: Mucous membranes are moist.  Pharynx: Oropharynx is clear.  Eyes:  General: No scleral icterus. Extraocular Movements: Extraocular movements intact.  Conjunctiva/sclera: Conjunctivae normal.  Pupils: Pupils are equal, round, and reactive to light.  Cardiovascular:  Rate and Rhythm: Normal rate and regular rhythm.  Pulses: Normal pulses.  Heart sounds: Normal heart sounds.  Pulmonary:  Effort: Pulmonary effort is normal. No respiratory distress.  Breath sounds: Normal breath sounds.  Abdominal:  General: Bowel sounds are normal.  Palpations: Abdomen is soft.  Tenderness: There is no abdominal tenderness.  Musculoskeletal:  General: No swelling, tenderness or deformity. Normal range of motion.  Cervical back: Normal range of motion and neck supple.  Skin: General:  Skin is warm and dry.  Coloration: Skin is not jaundiced.  Neurological:  General: No focal deficit present.  Mental Status: She is alert and oriented to person, place, and time.  Psychiatric:  Mood and Affect: Mood normal.  Behavior: Behavior normal.     Breast: There is no palpable mass in either breast. There is no palpable axillary,  supraclavicular, or cervical lymphadenopathy.  Labs, Imaging and Diagnostic Testing:  Assessment and Plan:   Diagnoses and all orders for this visit:  Malignant neoplasm of upper-outer quadrant of right breast in female, estrogen receptor positive (CMS/HHS-HCC)   The patient appears to have a 8 mm cancer in the upper outer quadrant of the right breast with clinically negative nodes. I have discussed with her in detail the different options for treatment and at this point she favors breast conservation which I feel is very reasonable. She would also be a good candidate for sentinel node biopsy as well. I have discussed with her in detail the risks and benefits of the surgery as well as some of the technical aspects including use of a radioactive seed for localization and she understands and wishes to proceed. We will move forward with surgical scheduling. We will hold off on placing a Port-A-Cath since the HER2 result does not seem to match the rest of the picture. We will repeat that on the cancer once it is removed. She will also meet with medical and radiation oncology to discuss adjuvant therapy

## 2023-10-13 NOTE — Transfer of Care (Deleted)
 Immediate Anesthesia Transfer of Care Note  Patient: Christie Molina  Procedure(s) Performed: RIGHT BREAST LUMPECTOMY WITH RADIOACTIVE SEED AND SENTINEL LYMPH NODE BIOPSY (Right: Breast)  Patient Location: PACU  Anesthesia Type:GA combined with regional for post-op pain  Level of Consciousness: drowsy  Airway & Oxygen Therapy: Patient Spontanous Breathing  Post-op Assessment: Report given to RN and Post -op Vital signs reviewed and stable  Post vital signs: Reviewed and stable  Last Vitals:  Vitals Value Taken Time  BP 116/68 10/13/23 1245  Temp 36.3 C 10/13/23 1244  Pulse 64 10/13/23 1247  Resp 21 10/13/23 1247  SpO2 100 % 10/13/23 1247  Vitals shown include unfiled device data.  Last Pain:  Vitals:   10/13/23 1244  TempSrc:   PainSc: Asleep      Patients Stated Pain Goal: 5 (10/13/23 0919)  Complications: No notable events documented.

## 2023-10-13 NOTE — Anesthesia Preprocedure Evaluation (Signed)
 Anesthesia Evaluation  Patient identified by MRN, date of birth, ID band Patient awake    Reviewed: Allergy & Precautions, NPO status , Patient's Chart, lab work & pertinent test results  Airway Mallampati: II  TM Distance: >3 FB     Dental no notable dental hx. (+) Teeth Intact, Caps, Dental Advisory Given   Pulmonary former smoker   Pulmonary exam normal breath sounds clear to auscultation       Cardiovascular negative cardio ROS Normal cardiovascular exam Rhythm:Regular Rate:Normal     Neuro/Psych   Anxiety     negative neurological ROS     GI/Hepatic Neg liver ROS,GERD  ,,  Endo/Other  Hypothyroidism  Right Breast Ca Hyperlipidemia  Renal/GU   negative genitourinary   Musculoskeletal negative musculoskeletal ROS (+)    Abdominal   Peds  Hematology negative hematology ROS (+)   Anesthesia Other Findings   Reproductive/Obstetrics                              Anesthesia Physical Anesthesia Plan  ASA: 2  Anesthesia Plan: General   Post-op Pain Management: Regional block*, Minimal or no pain anticipated, Tylenol  PO (pre-op)* and Precedex   Induction: Intravenous  PONV Risk Score and Plan: 4 or greater and Treatment may vary due to age or medical condition, Midazolam , Scopolamine  patch - Pre-op, Ondansetron  and Dexamethasone   Airway Management Planned: LMA  Additional Equipment: None  Intra-op Plan:   Post-operative Plan: Extubation in OR  Informed Consent: I have reviewed the patients History and Physical, chart, labs and discussed the procedure including the risks, benefits and alternatives for the proposed anesthesia with the patient or authorized representative who has indicated his/her understanding and acceptance.     Dental advisory given  Plan Discussed with: CRNA and Anesthesiologist  Anesthesia Plan Comments:          Anesthesia Quick Evaluation

## 2023-10-13 NOTE — Anesthesia Postprocedure Evaluation (Signed)
 Anesthesia Post Note  Patient: Christie Molina  Procedure(s) Performed: RIGHT BREAST LUMPECTOMY WITH RADIOACTIVE SEED AND SENTINEL LYMPH NODE BIOPSY (Right: Breast)     Patient location during evaluation: PACU Anesthesia Type: General Level of consciousness: awake and alert and oriented Pain management: pain level controlled Vital Signs Assessment: post-procedure vital signs reviewed and stable Respiratory status: spontaneous breathing, nonlabored ventilation and respiratory function stable Cardiovascular status: blood pressure returned to baseline and stable Postop Assessment: no apparent nausea or vomiting Anesthetic complications: no   No notable events documented.  Last Vitals:  Vitals:   10/13/23 1313 10/13/23 1315  BP:  109/61  Pulse: (!) 58 62  Resp: 10 11  Temp:    SpO2: 97% 94%    Last Pain:  Vitals:   10/13/23 1318  TempSrc:   PainSc: 2                  Darrien Belter A.

## 2023-10-13 NOTE — Transfer of Care (Signed)
 Immediate Anesthesia Transfer of Care Note  Patient: Christie Molina  Procedure(s) Performed: RIGHT BREAST LUMPECTOMY WITH RADIOACTIVE SEED AND SENTINEL LYMPH NODE BIOPSY (Right: Breast)  Patient Location: PACU  Anesthesia Type:GA combined with regional for post-op pain  Level of Consciousness: drowsy  Airway & Oxygen Therapy: Patient Spontanous Breathing  Post-op Assessment: Report given to RN and Post -op Vital signs reviewed and stable  Post vital signs: Reviewed and stable  Last Vitals:  Vitals Value Taken Time  BP 116/68 10/13/23 1245  Temp 36.3 C 10/13/23 1244  Pulse 71 10/13/23 1258  Resp 15 10/13/23 1258  SpO2 100 % 10/13/23 1258  Vitals shown include unfiled device data.  Last Pain:  Vitals:   10/13/23 1245  TempSrc:   PainSc: 0-No pain      Patients Stated Pain Goal: 5 (10/13/23 0919)  Complications: No notable events documented.

## 2023-10-14 ENCOUNTER — Encounter (HOSPITAL_BASED_OUTPATIENT_CLINIC_OR_DEPARTMENT_OTHER): Payer: Self-pay | Admitting: General Surgery

## 2023-10-14 ENCOUNTER — Encounter (HOSPITAL_BASED_OUTPATIENT_CLINIC_OR_DEPARTMENT_OTHER): Payer: Self-pay | Admitting: *Deleted

## 2023-10-20 ENCOUNTER — Telehealth: Payer: Self-pay | Admitting: *Deleted

## 2023-10-20 ENCOUNTER — Encounter: Payer: Self-pay | Admitting: *Deleted

## 2023-10-20 NOTE — Telephone Encounter (Signed)
 Requested repeat prognostics on her surgical path from pathology.

## 2023-10-26 ENCOUNTER — Encounter: Payer: Self-pay | Admitting: *Deleted

## 2023-10-26 ENCOUNTER — Ambulatory Visit: Payer: Self-pay | Admitting: Genetic Counselor

## 2023-10-26 ENCOUNTER — Inpatient Hospital Stay: Payer: 59 | Attending: Hematology and Oncology | Admitting: Hematology and Oncology

## 2023-10-26 ENCOUNTER — Telehealth: Payer: Self-pay | Admitting: Genetic Counselor

## 2023-10-26 VITALS — BP 136/76 | HR 80 | Temp 97.9°F | Resp 18 | Ht 61.0 in | Wt 138.1 lb

## 2023-10-26 DIAGNOSIS — C50411 Malignant neoplasm of upper-outer quadrant of right female breast: Secondary | ICD-10-CM

## 2023-10-26 DIAGNOSIS — Z170 Estrogen receptor positive status [ER+]: Secondary | ICD-10-CM | POA: Diagnosis not present

## 2023-10-26 DIAGNOSIS — Z79899 Other long term (current) drug therapy: Secondary | ICD-10-CM | POA: Diagnosis not present

## 2023-10-26 DIAGNOSIS — Z1731 Human epidermal growth factor receptor 2 positive status: Secondary | ICD-10-CM | POA: Diagnosis not present

## 2023-10-26 DIAGNOSIS — Z800 Family history of malignant neoplasm of digestive organs: Secondary | ICD-10-CM

## 2023-10-26 DIAGNOSIS — Z1721 Progesterone receptor positive status: Secondary | ICD-10-CM | POA: Insufficient documentation

## 2023-10-26 DIAGNOSIS — Z1379 Encounter for other screening for genetic and chromosomal anomalies: Secondary | ICD-10-CM

## 2023-10-26 DIAGNOSIS — Z803 Family history of malignant neoplasm of breast: Secondary | ICD-10-CM

## 2023-10-26 NOTE — Progress Notes (Signed)
HPI:   [name] was previously seen in the [facility] due to a personal and family history of breast cancer and concerns regarding a hereditary predisposition to cancer.    [name]'s recent genetic test results were disclosed to her by telephone. These results and recommendations are discussed in more detail below.  CANCER HISTORY:  Oncology History  Malignant neoplasm of upper-outer quadrant of right breast in female, estrogen receptor positive (HCC)  09/10/2023 Initial Diagnosis   Screening mammogram detected right breast mass UOQ: 0.8 cm at 10:00, axilla negative, biopsy: Grade 2 IDC ER 100%, PR 80%, Ki67 10%, HER2 positive by FISH ratio 3.26 copy #7    09/21/2023 Cancer Staging   Staging form: Breast, AJCC 8th Edition - Clinical stage from 09/21/2023: Stage IA (cT1b, cN0, cM0, G2, ER+, PR+, HER2+) - Signed by Ronny Bacon, PA-C on 09/21/2023 Stage prefix: Initial diagnosis Method of lymph node assessment: Clinical Histologic grading system: 3 grade system   10/13/2023 Surgery   Right lumpectomy: Invasive poorly differentiated adenocarcinoma grade 3, DCIS intermediate grade, 1.7 cm, margins negative, negative for angiolymphatic invasion, 0/5 lymph nodes negative, ER 100%, PR 80%, HER2 positive, Ki-67 10%   10/20/2023 Genetic Testing   Negative [facility] CancerNext Panel.  The report date is 10/20/2023.   The [facility] CancerNext Panel includes sequencing and rearrangement analysis for the following 39 genes: APC, ATM, BAP1, BARD1, BMPR1A, BRCA1, BRCA2, BRIP1, CDH1, CDKN2A, CHEK2, FH, FLCN, MET, MLH1, MSH2, MSH6, MUTYH, NF1, NTHL1, PALB2, PMS2, PTEN, RAD51C, RAD51D, SMAD4, STK11, TP53, TSC1, TSC2, and VHL (sequencing and deletion/duplication); AXIN2, HOXB13, MBD4, MSH3, POLD1 and POLE (sequencing only); EPCAM and GREM1 (deletion/duplication only).  Of note, RNA analysis was unable to be completed due to insufficient sample quality.      FAMILY HISTORY:  We  obtained a detailed, 4-generation family history.  Significant diagnoses are listed below:      Family History  Problem Relation Age of Onset   Pancreatic cancer Maternal Aunt 61   Breast cancer Paternal Aunt 26 - 63   Breast cancer Cousin          maternal first cousin       [name] is unaware of previous family history of genetic testing for hereditary cancer risks. There is no reported Ashkenazi Jewish ancestry.   GENETIC TEST RESULTS:  The [facility] CancerNext Panel found no pathogenic mutations.   The [facility] CancerNext Panel includes sequencing and rearrangement analysis for the following 39 genes: APC, ATM, BAP1, BARD1, BMPR1A, BRCA1, BRCA2, BRIP1, CDH1, CDKN2A, CHEK2, FH, FLCN, MET, MLH1, MSH2, MSH6, MUTYH, NF1, NTHL1, PALB2, PMS2, PTEN, RAD51C, RAD51D, SMAD4, STK11, TP53, TSC1, TSC2, and VHL (sequencing and deletion/duplication); AXIN2, HOXB13, MBD4, MSH3, POLD1 and POLE (sequencing only); EPCAM and GREM1 (deletion/duplication only).  The test report has been scanned into EPIC and is located under the Molecular Pathology section of the Results Review tab.  A portion of the result report is included below for reference. Genetic testing reported out on October 20, 2023.     Even though a pathogenic variant was not identified, possible explanations for the cancer in the family may include: There may be no hereditary risk for cancer in the family. The cancers in [name] and/or her family may be sporadic/familial or due to other genetic and environmental factors.  Most cancer is not hereditary.  There may be a gene mutation in one of these genes that current testing methods cannot detect but that chance is  small. There could be another gene that has not yet been discovered, or that we have not yet tested, that is responsible for the cancer diagnoses in the family.  It is also possible there is a hereditary cause for the cancer in the family that [name] did not  inherit.   Therefore, it is important to remain in touch with cancer genetics in the future so that we can continue to offer [name] the most up to date genetic testing.    ADDITIONAL GENETIC TESTING:  [name]'s genetic testing did not include RNA analysis due to insufficient sample quality.  We discussed that in very rare instances, RNA analysis can detected variants missed by DNA analysis alone.  [name] was not interested in providing an additional sample at this time for repeat analysis.   If there are additional relevant genes identified to increase cancer risk that can be analyzed in the future, we would be happy to discuss and coordinate this testing at that time.     CANCER SCREENING RECOMMENDATIONS:  [name]'s test result is considered negative (normal).  This means that we have not identified a hereditary cause for her personal history of breast cancer at this time.   An individual's cancer risk and medical management are not determined by genetic test results alone. Overall cancer risk assessment incorporates additional factors, including personal medical history, family history, and any available genetic information that may result in a personalized plan for cancer prevention and surveillance. Therefore, it is recommended she continue to follow the cancer management and screening guidelines provided by her oncology and primary healthcare provider.   RECOMMENDATIONS FOR FAMILY MEMBERS:   Since she did not inherit a identifiable mutation in a cancer predisposition gene included on this panel, her children could not have inherited a known mutation from her in one of these genes. Individuals in this family might be at some increased risk of developing cancer, over the general population risk, due to the family history of cancer.  Individuals in the family should notify their providers of the family history of cancer. We recommend women in this family have a yearly  mammogram beginning at age 50, or 76 years younger than the earliest onset of cancer, an annual clinical breast exam, and perform monthly breast self-exams.  Risk models that take into account family history and hormonal history may be helpful in determining appropriate breast cancer screening options for family members.  Other members of the family may still carry a pathogenic variant in one of these genes that [name] did not inherit. Based on the family history, we recommend her maternal cousin, who was diagnosed with breast cancer, have genetic counseling and testing. [name] can let us know if we can be of any assistance in coordinating genetic counseling and/or testing for this family member.     FOLLOW-UP:  Cancer genetics is a rapidly advancing field and it is possible that new genetic tests will be appropriate for her and/or her family members in the future. We encourage [name] to remain in contact with cancer genetics, so we can update her personal and family histories and let her know of advances in cancer genetics that may benefit this family.   Our contact number was provided.  They are welcome to call us at anytime with additional questions or concerns.   Nikolis Berent M. Rennie Plowman, MS, [facility] Genetic Counselor [email or web address] (P) [phone]

## 2023-10-26 NOTE — Telephone Encounter (Signed)
Disclosed negative genetics.  Patient said she had discussion with Dr. Pamelia Hoit today about chemotherapy and risk of recurrence and asked how these results impacted those risk numbers.  Explained to patient that results of this genetic test do not impact estimates for chances of recurrence provided by Dr. Pamelia Hoit.  Discussed that RNA analysis was unable to be completed.  Patient declined sample re-draw for RNA analysis at this time.

## 2023-10-26 NOTE — Progress Notes (Signed)
Patient Care Team: Margaree Mackintosh, MD as PCP - General (Internal Medicine) Jerene Bears, MD as Consulting Physician (Gynecology) Donnelly Angelica, RN as Oncology Nurse Navigator Pershing Proud, RN as Oncology Nurse Navigator Griselda Miner, MD as Consulting Physician (General Surgery) Serena Croissant, MD as Consulting Physician (Hematology and Oncology) Dorothy Puffer, MD as Consulting Physician (Radiation Oncology)  DIAGNOSIS:  Encounter Diagnosis  Name Primary?   Malignant neoplasm of upper-outer quadrant of right breast in female, estrogen receptor positive (HCC) Yes    SUMMARY OF ONCOLOGIC HISTORY: Oncology History  Malignant neoplasm of upper-outer quadrant of right breast in female, estrogen receptor positive (HCC)  09/10/2023 Initial Diagnosis   Screening mammogram detected right breast mass UOQ: 0.8 cm at 10:00, axilla negative, biopsy: Grade 2 IDC ER 100%, PR 80%, Ki67 10%, HER2 positive by FISH ratio 3.26 copy #7    09/21/2023 Cancer Staging   Staging form: Breast, AJCC 8th Edition - Clinical stage from 09/21/2023: Stage IA (cT1b, cN0, cM0, G2, ER+, PR+, HER2+) - Signed by Ronny Bacon, PA-C on 09/21/2023 Stage prefix: Initial diagnosis Method of lymph node assessment: Clinical Histologic grading system: 3 grade system    Genetic Testing   [facility] BRCAplus was Negative. Of note, we are still waiting on the pan-cancer panel. Report date is 09/30/2023.  The BRCAplus panel offered by [employer] and includes sequencing and deletion/duplication analysis for the following 13 genes: ATM, BARD1, BRCA1, BRCA2, CDH1, CHEK2, NF1, PALB2, PTEN, RAD51C, RAD51D, STK11, and TP53.    10/13/2023 Surgery   Right lumpectomy: Invasive poorly differentiated adenocarcinoma grade 3, DCIS intermediate grade, 1.7 cm, margins negative, negative for angiolymphatic invasion, 0/5 lymph nodes negative, ER 100%, PR 80%, HER2 positive, Ki-67 10%     CHIEF COMPLIANT: Follow-up to discuss  pathology report  HISTORY OF PRESENT ILLNESS:  History of Present Illness   The patient, with a recent history of breast cancer surgery, presents with concerns about the proposed treatment plan. She expresses apprehension about the potential side effects of chemotherapy, particularly hair loss and neuropathy. The patient also expresses concern about the duration of the treatment, which is expected to last a year. She is particularly worried about the impact of the treatment on her daily life and upcoming family events. The patient also has a family history of heart disease and is concerned about the potential cardiac side effects of Herceptin, a proposed component of her treatment. The patient is also considering the use of DigniCap, a scalp cooling technology, to prevent hair loss during chemotherapy.         ALLERGIES:  has no known allergies.  MEDICATIONS:  Current Outpatient Medications  Medication Sig Dispense Refill   cetirizine (ZYRTEC) 10 MG tablet Take 10 mg by mouth daily.     famotidine (PEPCID) 20 MG tablet Take 20 mg by mouth daily.     levothyroxine (SYNTHROID) 75 MCG tablet TAKE 1 TABLET(75 MCG) BY MOUTH DAILY 90 tablet 3   meloxicam (MOBIC) 7.5 MG tablet TAKE 1 TO 2 TABLETS BY MOUTH DAILY WITH A MEAL. 30 tablet 0   oxyCODONE (ROXICODONE) 5 MG immediate release tablet Take 1 tablet (5 mg total) by mouth every 6 (six) hours as needed for severe pain (pain score 7-10). 10 tablet 0   pantoprazole (PROTONIX) 20 MG tablet Take 20 mg by mouth daily. (Patient taking differently: Take 20 mg by mouth daily. TAKES SOMETIMES)     simvastatin (ZOCOR) 20 MG tablet TAKE 1 TABLET(20 MG)  BY MOUTH EVERY EVENING 90 tablet 3   No current facility-administered medications for this visit.    PHYSICAL EXAMINATION: ECOG PERFORMANCE STATUS: 1 - Symptomatic but completely ambulatory  Vitals:   10/26/23 0850  BP: 136/76  Pulse: 80  Resp: 18  Temp: 97.9 F (36.6 C)  SpO2: 100%   Filed  Weights   10/26/23 0850  Weight: 138 lb 1.6 oz (62.6 kg)    LABORATORY DATA:  I have reviewed the data as listed    Latest Ref Rng & Units 09/22/2023   12:42 PM 06/28/2023    9:22 AM 06/22/2022   11:10 AM  CMP  Glucose 70 - 99 mg/dL 95  91  88   BUN 8 - 23 mg/dL 12  11  11    Creatinine 0.44 - 1.00 mg/dL 8.41  3.24  4.01   Sodium 135 - 145 mmol/L 141  141  141   Potassium 3.5 - 5.1 mmol/L 4.2  4.9  4.6   Chloride 98 - 111 mmol/L 106  104  103   CO2 22 - 32 mmol/L 31  29  30    Calcium 8.9 - 10.3 mg/dL 9.2  9.7  9.8   Total Protein 6.5 - 8.1 g/dL 6.5  7.0  6.9   Total Bilirubin <1.2 mg/dL 0.4  0.7  0.6   Alkaline Phos 38 - 126 U/L 59     AST 15 - 41 U/L 19  20  23    ALT 0 - 44 U/L 17  15  13      Lab Results  Component Value Date   WBC 4.2 09/22/2023   HGB 13.5 09/22/2023   HCT 40.5 09/22/2023   MCV 90.6 09/22/2023   PLT 227 09/22/2023   NEUTROABS 2.3 09/22/2023    ASSESSMENT & PLAN:  Malignant neoplasm of upper-outer quadrant of right breast in female, estrogen receptor positive (HCC) 09/10/2023: Screening mammogram detected right breast mass UOQ: 0.8 cm at 10:00, axilla negative, biopsy: Grade 2 IDC ER 100%, PR 80%, Ki67 10%, HER2 positive by FISH ratio 3.26 copy #7   10/13/2023: Right lumpectomy: Invasive poorly differentiated adenocarcinoma grade 3, DCIS intermediate grade, 1.7 cm, margins negative, negative for angiolymphatic invasion, 0/5 lymph nodes negative, ER 100%, PR 80%, HER2 positive, Ki-67 10%  Pathology counseling: I discussed the final pathology report of the patient provided  a copy of this report. I discussed the margins as well as lymph node surgeries. We also discussed the final staging along with previously performed ER/PR and HER-2/neu testing.  Treatment plan: Adjuvant Taxol Herceptin followed by Herceptin maintenance for 1 year  Adjuvant radiation Adjuvant antiestrogen  therapy --------------------------------------------------------------------------------------------------------------------- Chemo counseling: I went over the risks and benefits of Taxol and Herceptin in detail.  Also discussed the risks and benefits in terms of risk of reduction of distant recurrence. Patient is very anxious and worried about the risks of going through chemotherapy. I would like to repeat the HER2 testing to confirm that it is positive before finalizing the treatment plan. I will call her next week to go over that result and inquire what she would like to do based on our discussion today.  No orders of the defined types were placed in this encounter.  The patient has a good understanding of the overall plan. she agrees with it. she will call with any problems that may develop before the next visit here. Total time spent: 30 mins including face to face time and time spent for planning, charting and  co-ordination of care   Tamsen Meek, MD 10/26/23

## 2023-10-26 NOTE — Assessment & Plan Note (Signed)
09/10/2023: Screening mammogram detected right breast mass UOQ: 0.8 cm at 10:00, axilla negative, biopsy: Grade 2 IDC ER 100%, PR 80%, Ki67 10%, HER2 positive by FISH ratio 3.26 copy #7   10/13/2023: Right lumpectomy: Invasive poorly differentiated adenocarcinoma grade 3, DCIS intermediate grade, 1.7 cm, margins negative, negative for angiolymphatic invasion, 0/5 lymph nodes negative, ER 100%, PR 80%, HER2 positive, Ki-67 10%  Pathology counseling: I discussed the final pathology report of the patient provided  a copy of this report. I discussed the margins as well as lymph node surgeries. We also discussed the final staging along with previously performed ER/PR and HER-2/neu testing.  Treatment plan: Adjuvant Taxol Herceptin followed by Herceptin maintenance for 1 year  Adjuvant radiation Adjuvant antiestrogen therapy --------------------------------------------------------------------------------------------------------------------- Return to clinic in 2 weeks to start chemotherapy

## 2023-10-27 ENCOUNTER — Telehealth: Payer: Self-pay | Admitting: *Deleted

## 2023-10-27 NOTE — Telephone Encounter (Signed)
Spoke with pathology to follow up on repeat prognostics. Evidently these were not ordered.  Requested rush repeat prognostics.

## 2023-11-01 ENCOUNTER — Encounter: Payer: Self-pay | Admitting: *Deleted

## 2023-11-01 ENCOUNTER — Ambulatory Visit: Payer: 59

## 2023-11-01 ENCOUNTER — Telehealth: Payer: Self-pay | Admitting: *Deleted

## 2023-11-01 LAB — SURGICAL PATHOLOGY

## 2023-11-01 NOTE — Telephone Encounter (Signed)
Spoke with patient to let her know the repeat prognostics and that it was HER2-neg.  Explained that we would order an oncotype and get those results before proceeding with xrt.  Patient verbalized understanding.

## 2023-11-02 ENCOUNTER — Encounter: Payer: Self-pay | Admitting: Physical Therapy

## 2023-11-02 ENCOUNTER — Ambulatory Visit: Payer: 59 | Attending: General Surgery | Admitting: Physical Therapy

## 2023-11-02 DIAGNOSIS — Z170 Estrogen receptor positive status [ER+]: Secondary | ICD-10-CM | POA: Insufficient documentation

## 2023-11-02 DIAGNOSIS — C50411 Malignant neoplasm of upper-outer quadrant of right female breast: Secondary | ICD-10-CM | POA: Diagnosis present

## 2023-11-02 DIAGNOSIS — R293 Abnormal posture: Secondary | ICD-10-CM | POA: Insufficient documentation

## 2023-11-02 NOTE — Therapy (Signed)
OUTPATIENT PHYSICAL THERAPY BREAST CANCER POST OP FOLLOW UP   Patient Name: Christie Molina MRN: [id number] DOB:10-31-1960, 63 y.o., female Today's Date: 11/02/2023  END OF SESSION:   Past Medical History:  Diagnosis Date   Breast cancer (HCC)    right breast IDC   GERD (gastroesophageal reflux disease)    Hyperlipidemia    Hypothyroidism    Thyroid disease    Vitamin D deficiency    Past Surgical History:  Procedure Laterality Date   BREAST LUMPECTOMY WITH RADIOACTIVE SEED AND SENTINEL LYMPH NODE BIOPSY Right 10/13/2023   Procedure: RIGHT BREAST LUMPECTOMY WITH RADIOACTIVE SEED AND SENTINEL LYMPH NODE BIOPSY;  Surgeon: Griselda Miner, MD;  Location: [facility];  Service: General;  Laterality: Right;   COLONOSCOPY WITH PROPOFOL N/A 07/24/2014   Procedure: COLONOSCOPY WITH PROPOFOL;  Surgeon: Charolett Bumpers, MD;  Location: [facility];  Service: Endoscopy;  Laterality: N/A;   UPPER GASTROINTESTINAL ENDOSCOPY  08/2018   Patient Active Problem List   Diagnosis Date Noted   Genetic testing 10/01/2023   Malignant neoplasm of upper-outer quadrant of right breast in female, estrogen receptor positive (HCC) 09/20/2023   Hyperplastic polyps of stomach 06/05/2019   Anxiety state 07/06/2013   Hypothyroidism 06/29/2011   GE reflux 06/29/2011   Hyperlipidemia 06/29/2011    PCP: Marlan Palau, MD  REFERRING PROVIDER: Dr. Chevis Pretty   REFERRING DIAG: Right breast cancer  THERAPY DIAG:  No diagnosis found.  Rationale for Evaluation and Treatment: Rehabilitation  ONSET DATE: 09/10/2023  SUBJECTIVE:                                                                                                                                                                                           SUBJECTIVE STATEMENT: My arm feels a little tight. I would love to sleep on my right side but I have not been able to do that because it is uncomfortable.   PERTINENT HISTORY:  Patient  was diagnosed on 09/10/2023 with right grade 2 invasive ductal carcinoma breast cancer. It measures 8 mm and is located in the upper outer quadrant. It is triple positive with a Ki67 of 10%. 10/13/23- R breast lumpectomy and SLNB (0/5)  PATIENT GOALS:  Reassess how my recovery is going related to arm function, pain, and swelling.  PAIN:  Are you having pain? No  PRECAUTIONS: Recent Surgery, right UE Lymphedema risk,   RED FLAGS: None   ACTIVITY LEVEL / LEISURE: pt has not been exercising since surgery   OBJECTIVE:   PATIENT SURVEYS:  QUICK DASH:  Quick Dash - 11/02/23 0001     Open a  tight or new jar Mild difficulty    Do heavy household chores (wash walls, wash floors) No difficulty    Carry a shopping bag or briefcase No difficulty    Wash your back No difficulty    Use a knife to cut food No difficulty    Recreational activities in which you take some force or impact through your arm, shoulder, or hand (golf, hammering, tennis) Mild difficulty    During the past week, to what extent has your arm, shoulder or hand problem interfered with your normal social activities with family, friends, neighbors, or groups? Not at all    During the past week, to what extent has your arm, shoulder or hand problem limited your work or other regular daily activities Not at all    Arm, shoulder, or hand pain. Mild    Tingling (pins and needles) in your arm, shoulder, or hand None    Difficulty Sleeping No difficulty    DASH Score 6.82 %              OBSERVATIONS: Healing SLNB and lumpectomy scar with only slight post op edema present at R breast just around scar  POSTURE:  Forward head and rounded shoulders posture   UPPER EXTREMITY AROM/PROM:   A/PROM RIGHT   eval   RIGHT 11/02/23  Shoulder extension 60 81  Shoulder flexion 160 162  Shoulder abduction 162 174  Shoulder internal rotation 78 67  Shoulder external rotation 80 84                          (Blank rows = not tested)    A/PROM LEFT   eval  Shoulder extension 60  Shoulder flexion 150  Shoulder abduction 160  Shoulder internal rotation 67  Shoulder external rotation 80                          (Blank rows = not tested)   CERVICAL AROM: All within normal limits     UPPER EXTREMITY STRENGTH: WNL   LYMPHEDEMA ASSESSMENTS (in cm):    LANDMARK RIGHT   eval RIGHT 11/02/23  10 cm proximal to olecranon process 27.2 26.5  Olecranon process 23.5 23.6  10 cm proximal to ulnar styloid process 21.1 19.6  Just proximal to ulnar styloid process 14 14  Across hand at thumb web space 18 18  At base of 2nd digit 5.9 5.8  (Blank rows = not tested)   LANDMARK LEFT   eval  10 cm proximal to olecranon process 27.2  Olecranon process 22.6  10 cm proximal to ulnar styloid process 20  Just proximal to ulnar styloid process 13.7  Across hand at thumb web space 17.3  At base of 2nd digit 5.9  (Blank rows = not tested)    Surgery type/Date: 10/13/23 R breast lumpectomy with SLNB Number of lymph nodes removed: 0/5 Current/past treatment (chemo, radiation, hormone therapy): will need hormone therapy, will need radiation, waiting to see if she will need chemo Other symptoms:  Heaviness/tightness No Pain No Pitting edema No Infections No Decreased scar mobility No Stemmer sign No  PATIENT EDUCATION:  Education details: ABC class, posture, scar mobilization, lymphedema risk reduction practices Person educated: Patient Education method: Explanation and Handouts Education comprehension: verbalized understanding  HOME EXERCISE PROGRAM: Reviewed previously given post op HEP. Scar massage at 6 weeks  ASSESSMENT:  CLINICAL IMPRESSION: Pt returns to PT after undergoing a  R breast lumpectomy and SLNB 0/5 on 10/13/23. She has returned to baseline shoulder ROM and is not demonstrating any signs of lymphedema. Educated pt to ease back in to normal activities such as vacuuming and lifting her grandson to decrease risk  of lymphedema. Pt educated about ABC class to for lymphedema risk reduction practices. Pt will be discharged at thsi time but will continue l dex screenings every 3 months post op to screen for subclinical lymphedema.   Pt will benefit from skilled therapeutic intervention to improve on the following deficits: Decreased knowledge of precautions, impaired UE functional use, pain, decreased ROM, postural dysfunction.   PT treatment/interventions: ADL/Self care home management, 97110-Therapeutic exercises, 97530- Therapeutic activity, and 16109- Self Care   GOALS: Goals reviewed with patient? Yes  LONG TERM GOALS:  (STG=LTG)  GOALS Name Target Date  Goal status  1 Pt will demonstrate she has regained full shoulder ROM and function post operatively compared to baselines.  Baseline: 11/02/23 MET     PLAN:  PT FREQUENCY/DURATION: d/c this visit  PLAN FOR NEXT SESSION: d/c this visit   [facility]  [address]  [place] [address]  [phone]  After Breast Cancer Class It is recommended you attend the ABC class to be educated on lymphedema risk reduction. This class is free of charge and lasts for 1 hour. It is a 1-time class. You will need to download the TEAMS app either on your phone or computer. We will send you a link the night before or the morning of the class. You should be able to click on that link to join the class. This is not a confidential class. You don't have to turn your camera on, but other participants may be able to see your email address.  Scar massage You can begin gentle scar massage to you incision sites. Gently place one hand on the incision and move the skin (without sliding on the skin) in various directions. Do this for a few minutes and then you can gently massage either coconut oil or vitamin E cream into the scars.  Compression garment You should continue wearing your compression bra until you feel like you no longer  have swelling.  Home exercise Program Continue doing the exercises you were given until you feel like you can do them without feeling any tightness at the end.   Walking Program Studies show that 30 minutes of walking per day (fast enough to elevate your heart rate) can significantly reduce the risk of a cancer recurrence. If you can't walk due to other medical reasons, we encourage you to find another activity you could do (like a stationary bike or water exercise).  Posture After breast cancer surgery, people frequently sit with rounded shoulders posture because it puts their incisions on slack and feels better. If you sit like this and scar tissue forms in that position, you can become very tight and have pain sitting or standing with good posture. Try to be aware of your posture and sit and stand up tall to heal properly.  Follow up PT: It is recommended you return every 3 months for the first 3 years following surgery to be assessed on the SOZO machine for an L-Dex score. This helps prevent clinically significant lymphedema in 95% of patients. These follow up screens are 10 minute appointments that you are not billed for.  Milagros Loll [place], PT 11/02/2023, 1:57 PM  PHYSICAL THERAPY DISCHARGE SUMMARY  Visits from Start of  Care: 2  Current functional level related to goals / functional outcomes: All goals met   Remaining deficits: None   Education / Equipment: Lymphedema, HEP, scar mobilization, posture   Patient agrees to discharge. Patient goals were met. Patient is being discharged due to meeting the stated rehab goals.  Milagros Loll [place], [place] 11/02/23 4:38 PM

## 2023-11-09 ENCOUNTER — Telehealth: Payer: Self-pay | Admitting: *Deleted

## 2023-11-09 ENCOUNTER — Inpatient Hospital Stay
Admission: RE | Admit: 2023-11-09 | Discharge: 2023-11-09 | Disposition: A | Discharge status: Home / Self Care | Payer: Self-pay | Source: Ambulatory Visit | Attending: Radiation Oncology | Admitting: Radiation Oncology

## 2023-11-09 ENCOUNTER — Other Ambulatory Visit: Payer: Self-pay | Admitting: Radiation Oncology

## 2023-11-09 ENCOUNTER — Telehealth: Payer: Self-pay | Admitting: Radiation Oncology

## 2023-11-09 ENCOUNTER — Encounter: Payer: Self-pay | Admitting: *Deleted

## 2023-11-09 DIAGNOSIS — C50411 Malignant neoplasm of upper-outer quadrant of right female breast: Secondary | ICD-10-CM

## 2023-11-09 NOTE — Telephone Encounter (Signed)
Her oncotype also showed patient to be HER2 neg. Spoke with patient to ask if she would like her path sent to [facility] also for 2nd opinion. She declined at this time.

## 2023-11-09 NOTE — Telephone Encounter (Signed)
Called patient to schedule a consultation w. Jill Side. Patient requested appointment delay for treatment planning, scheduled as requested.

## 2023-11-09 NOTE — Telephone Encounter (Signed)
 Received oncotype results of 21/7%.  Spoke with patient to give her results and that she does not require chemo. Her next step would be xrt.  She wants to talk with Dr. Odean to discuss her final path and the discrepancy with the Her2 results. Appt confirmed for 2/12 at 1050.  She is ok with moving forward with an appt to see Dr. Dewey to discuss xrt. Referral placed.

## 2023-11-10 ENCOUNTER — Encounter: Payer: Self-pay | Admitting: Radiation Oncology

## 2023-11-10 NOTE — Progress Notes (Signed)
 Nursing interview for Malignant neoplasm of upper-outer quadrant of right breast in female, estrogen receptor positive (HCC).   Patient identity verified x2.  Patient reports muscle tightness in the upper RT arm. NO ROM issues. Patient denies any other related issues at this time.  Meaningful use complete.  Vitals- Limited via telephone: Ht 5' 1 (1.549 m)   Wt 138 lb (62.6 kg)   LMP 07/22/2013 (Exact Date)   BMI 26.07 kg/m    This concludes the interaction.  Rosaline Minerva, LPN

## 2023-11-11 ENCOUNTER — Ambulatory Visit: Admission: RE | Admit: 2023-11-11 | Payer: 59 | Source: Ambulatory Visit

## 2023-11-11 ENCOUNTER — Encounter: Payer: Self-pay | Admitting: *Deleted

## 2023-11-11 ENCOUNTER — Ambulatory Visit
Admission: RE | Admit: 2023-11-11 | Discharge: 2023-11-11 | Disposition: A | Discharge status: Home / Self Care | Payer: 59 | Source: Ambulatory Visit | Attending: Radiation Oncology | Admitting: Radiation Oncology

## 2023-11-11 VITALS — Ht 61.0 in | Wt 138.0 lb

## 2023-11-11 DIAGNOSIS — Z170 Estrogen receptor positive status [ER+]: Secondary | ICD-10-CM

## 2023-11-12 ENCOUNTER — Encounter (HOSPITAL_COMMUNITY): Payer: Self-pay

## 2023-11-14 NOTE — Progress Notes (Signed)
 Radiation Oncology         [phone] ________________________________   Outpatient Visit - Conducted via telephone at patient request.  I spoke with the patient to conduct this visit via telephone. The patient was notified in advance and was offered an in person or telemedicine meeting to allow for face to face communication but instead preferred to proceed with a telephone visit.  Name: Christie Molina        MRN: [id number]  Date of Service: 11/11/2023 DOB: 1961/02/28  CC:Christie Ronal PARAS, MD  Christie Potts, MD     REFERRING PHYSICIAN: Odean Potts, MD   DIAGNOSIS: The encounter diagnosis was Malignant neoplasm of upper-outer quadrant of right breast in female, estrogen receptor positive (HCC).   HISTORY OF PRESENT ILLNESS: Christie Molina is a 63 y.o. female originally seen in the multidisciplinary breast clinic for a new diagnosis of right breast cancer. The patient was noted to have a screening detected architectural distortion in the left breast as well as a mass in the  upper outer quadrant of the right breast.  She underwent further diagnostic imaging on 08/26/2023, the left breast findings were consistent with superimposed normal fibroglandular tissue, but the right breast mass persisted and by ultrasound the same day showed a mass in the 10 o'clock position measuring 8 mm in greatest dimension, the axilla was negative for adenopathy.  A biopsy on 09/10/2023 showed a grade 2 invasive ductal carcinoma that was ER/PR positive, HER2 was amplified with a Ki-67 of 10%.    Since her last visit, the patient underwent right lumpectomy with sentinel lymph node biopsy on 10/13/2023.  Final pathology showed a grade 3 invasive ductal carcinoma with associated intermediate grade DCIS.  Her tumor was 1.7 cm, and margins were clear for invasive and in situ disease, her in situ margin that was closest was 0.25 mm from the inferior aspect.  5 sentinel lymph nodes were negative for disease.  Interestingly  her Oncotype score was 21 but her single-gene scores showed that the tumor was ER positive PR positive and HER2 negative.  Her lumpectomy specimen was retested for her to and was negative, and again ER and PR remained positive.  She is contacted by phone today to discuss adjuvant radiation.   PREVIOUS RADIATION THERAPY: No   PAST MEDICAL HISTORY:  Past Medical History:  Diagnosis Date   Breast cancer (HCC)    right breast IDC   GERD (gastroesophageal reflux disease)    Hyperlipidemia    Hypothyroidism    Thyroid disease    Vitamin D  deficiency        PAST SURGICAL HISTORY: Past Surgical History:  Procedure Laterality Date   BREAST LUMPECTOMY WITH RADIOACTIVE SEED AND SENTINEL LYMPH NODE BIOPSY Right 10/13/2023   Procedure: RIGHT BREAST LUMPECTOMY WITH RADIOACTIVE SEED AND SENTINEL LYMPH NODE BIOPSY;  Surgeon: Curvin Deward MOULD, MD;  Location: [facility];  Service: General;  Laterality: Right;   COLONOSCOPY WITH PROPOFOL  N/A 07/24/2014   Procedure: COLONOSCOPY WITH PROPOFOL ;  Surgeon: Gladis MARLA Louder, MD;  Location: [facility];  Service: Endoscopy;  Laterality: N/A;   UPPER GASTROINTESTINAL ENDOSCOPY  08/2018     FAMILY HISTORY:  Family History  Problem Relation Age of Onset   Heart disease Father    Pancreatic cancer Maternal Aunt 30   Breast cancer Paternal Aunt 35 - 62   Breast cancer Cousin        maternal first cousin     SOCIAL HISTORY:  reports that she quit smoking about 37 years ago. Her smoking use included cigarettes. She started smoking about 41 years ago. She has a 0.4 pack-year smoking history. She has never used smokeless tobacco. She reports current alcohol use. She reports that she does not use drugs. The patient is married and lives in [place]. She enjoys shopping and her daughter's wedding is this coming May.    ALLERGIES: Patient has no known allergies.   MEDICATIONS:  Current Outpatient Medications  Medication Sig Dispense Refill    cetirizine (ZYRTEC) 10 MG tablet Take 10 mg by mouth daily.     famotidine (PEPCID) 20 MG tablet Take 20 mg by mouth daily.     levothyroxine  (SYNTHROID ) 75 MCG tablet TAKE 1 TABLET(75 MCG) BY MOUTH DAILY 90 tablet 3   meloxicam  (MOBIC ) 7.5 MG tablet TAKE 1 TO 2 TABLETS BY MOUTH DAILY WITH A MEAL. 30 tablet 0   oxyCODONE  (ROXICODONE ) 5 MG immediate release tablet Take 1 tablet (5 mg total) by mouth every 6 (six) hours as needed for severe pain (pain score 7-10). 10 tablet 0   pantoprazole  (PROTONIX ) 20 MG tablet Take 20 mg by mouth daily. (Patient taking differently: Take 20 mg by mouth daily. TAKES SOMETIMES)     simvastatin  (ZOCOR ) 20 MG tablet TAKE 1 TABLET(20 MG) BY MOUTH EVERY EVENING 90 tablet 3   No current facility-administered medications for this encounter.     REVIEW OF SYSTEMS: On review of systems, the patient reports that she is doing pretty well since her surgery.  She is still quite frustrated and the inconsistencies with the HER2 testing.  She reports that she has had some tightness in her right upper arm since her surgery but denies any changes in range of motion regarding this.  No other complaints are verbalized.    PHYSICAL EXAM:  Wt Readings from Last 3 Encounters:  11/10/23 138 lb (62.6 kg)  10/26/23 138 lb 1.6 oz (62.6 kg)  10/13/23 140 lb 10.5 oz (63.8 kg)   Temp Readings from Last 3 Encounters:  10/26/23 97.9 F (36.6 C) (Temporal)  10/13/23 97.9 F (36.6 C)  09/22/23 (!) 97.2 F (36.2 C) (Temporal)   BP Readings from Last 3 Encounters:  10/26/23 136/76  10/13/23 120/69  09/22/23 132/62   Pulse Readings from Last 3 Encounters:  10/26/23 80  10/13/23 60  09/22/23 67    In general this is a well appearing caucasian female in no acute distress. She's alert and oriented x4 and appropriate throughout the examination. Cardiopulmonary assessment is negative for acute distress and she exhibits normal effort. Bilateral breast exam is deferred.    ECOG =  1  0 - Asymptomatic (Fully active, able to carry on all predisease activities without restriction)  1 - Symptomatic but completely ambulatory (Restricted in physically strenuous activity but ambulatory and able to carry out work of a light or sedentary nature. For example, light housework, office work)  2 - Symptomatic, <50% in bed during the day (Ambulatory and capable of all self care but unable to carry out any work activities. Up and about more than 50% of waking hours)  3 - Symptomatic, >50% in bed, but not bedbound (Capable of only limited self-care, confined to bed or chair 50% or more of waking hours)  4 - Bedbound (Completely disabled. Cannot carry on any self-care. Totally confined to bed or chair)  5 - Death   Raylene MM, Creech RH, Tormey DC, et al. [phone]). Toxicity and response  criteria of the [facility]. Am. DOROTHA Bridges. Oncol. 5 (6): 649-55    LABORATORY DATA:  Lab Results  Component Value Date   WBC 4.2 09/22/2023   HGB 13.5 09/22/2023   HCT 40.5 09/22/2023   MCV 90.6 09/22/2023   PLT 227 09/22/2023   Lab Results  Component Value Date   NA 141 09/22/2023   K 4.2 09/22/2023   CL 106 09/22/2023   CO2 31 09/22/2023   Lab Results  Component Value Date   ALT 17 09/22/2023   AST 19 09/22/2023   ALKPHOS 59 09/22/2023   BILITOT 0.4 09/22/2023      RADIOGRAPHY: No results found.     IMPRESSION/PLAN: 1. Stage IA, pT1cN0M0, grade 3, ER/PR positive invasive ductal carcinoma of the right breast. Dr. Dewey has reviewed her final pathology findings and today we discussed the nature of early stage breast disease.  She has done well since surgery.  While HER2 results were different from her initial biopsy.  It appears that retesting her lumpectomy specimen confirmed that this was negative as was identified on her Oncotype report.  She is planning to still discuss this with Dr. Gudena.  Dr. Dewey recommends adjuvant external radiotherapy to the breast   to reduce risks of local recurrence. Dr. Gudena has also discussed adjuvant antiestrogen therapy to follow. We discussed the risks, benefits, short, and long term effects of radiotherapy, as well as the curative intent, and the patient is interested in proceeding. Dr. Dewey discusses the delivery and logistics of radiotherapy and anticipates a course of 4 weeks of radiotherapy to the right breast.  We will move her simulation appointment time until after she can meet back with Dr. Odean to ensure that she is comfortable with the plan to move forward.     This encounter was conducted via telephone.  The patient has provided two factor identification and has given verbal consent for this type of encounter and has been advised to only accept a meeting of this type in a secure network environment. The time spent during this encounter was 45 minutes including preparation, discussion, and coordination of the patient's care. The attendants for this meeting include  Donald Estefana Husband  and Mliss JONETTA Specking.  During the encounter, there is no way that they run around that they could go somewhere the [employer] was located at [facility] Radiation Oncology Department.  [place] D Xiong was located at home.     Donald KYM Husband, [facility]    **Disclaimer: This note was dictated with voice recognition software. Similar sounding words can inadvertently be transcribed and this note may contain transcription errors which may not have been corrected upon publication of note.**

## 2023-11-16 ENCOUNTER — Ambulatory Visit: Payer: 59

## 2023-11-17 ENCOUNTER — Encounter: Payer: Self-pay | Admitting: *Deleted

## 2023-11-17 ENCOUNTER — Inpatient Hospital Stay: Payer: 59 | Attending: Hematology and Oncology | Admitting: Hematology and Oncology

## 2023-11-17 VITALS — BP 129/69 | HR 64 | Temp 98.1°F | Resp 18 | Ht 61.0 in | Wt 140.6 lb

## 2023-11-17 DIAGNOSIS — Z79899 Other long term (current) drug therapy: Secondary | ICD-10-CM | POA: Insufficient documentation

## 2023-11-17 DIAGNOSIS — Z1721 Progesterone receptor positive status: Secondary | ICD-10-CM | POA: Diagnosis not present

## 2023-11-17 DIAGNOSIS — N6310 Unspecified lump in the right breast, unspecified quadrant: Secondary | ICD-10-CM | POA: Insufficient documentation

## 2023-11-17 DIAGNOSIS — Z170 Estrogen receptor positive status [ER+]: Secondary | ICD-10-CM

## 2023-11-17 DIAGNOSIS — C50411 Malignant neoplasm of upper-outer quadrant of right female breast: Secondary | ICD-10-CM

## 2023-11-17 DIAGNOSIS — Z1731 Human epidermal growth factor receptor 2 positive status: Secondary | ICD-10-CM | POA: Insufficient documentation

## 2023-11-17 NOTE — Progress Notes (Signed)
Patient Care Team: Margaree Mackintosh, MD as PCP - General (Internal Medicine) Jerene Bears, MD as Consulting Physician (Gynecology) Donnelly Angelica, RN as Oncology Nurse Navigator Pershing Proud, RN as Oncology Nurse Navigator Griselda Miner, MD as Consulting Physician (General Surgery) Serena Croissant, MD as Consulting Physician (Hematology and Oncology) Dorothy Puffer, MD as Consulting Physician (Radiation Oncology)  DIAGNOSIS:  Encounter Diagnosis  Name Primary?   Malignant neoplasm of upper-outer quadrant of right breast in female, estrogen receptor positive (HCC) Yes    SUMMARY OF ONCOLOGIC HISTORY: Oncology History  Malignant neoplasm of upper-outer quadrant of right breast in female, estrogen receptor positive (HCC)  09/10/2023 Initial Diagnosis   Screening mammogram detected right breast mass UOQ: 0.8 cm at 10:00, axilla negative, biopsy: Grade 2 IDC ER 100%, PR 80%, Ki67 10%, HER2 positive by FISH ratio 3.26 copy #7    09/21/2023 Cancer Staging   Staging form: Breast, AJCC 8th Edition - Clinical stage from 09/21/2023: Stage IA (cT1b, cN0, cM0, G2, ER+, PR+, HER2+) - Signed by Ronny Bacon, PA-C on 09/21/2023 Stage prefix: Initial diagnosis Method of lymph node assessment: Clinical Histologic grading system: 3 grade system   10/13/2023 Surgery   Right lumpectomy: Invasive poorly differentiated adenocarcinoma grade 3, DCIS intermediate grade, 1.7 cm, margins negative, negative for angiolymphatic invasion, 0/5 lymph nodes negative, ER 100%, PR 80%, HER2 positive, Ki-67 10%   10/20/2023 Genetic Testing   Negative [facility] Panel.  The report date is 10/20/2023.   The [facility] Panel includes sequencing and rearrangement analysis for the following 39 genes: APC, ATM, BAP1, BARD1, BMPR1A, BRCA1, BRCA2, BRIP1, CDH1, CDKN2A, CHEK2, FH, FLCN, MET, MLH1, MSH2, MSH6, MUTYH, NF1, NTHL1, PALB2, PMS2, PTEN, RAD51C, RAD51D, SMAD4, STK11, TP53, TSC1, TSC2, and VHL  (sequencing and deletion/duplication); AXIN2, HOXB13, MBD4, MSH3, POLD1 and POLE (sequencing only); EPCAM and GREM1 (deletion/duplication only).  Of note, RNA analysis was unable to be completed due to insufficient sample quality.    11/14/2023 Cancer Staging   Staging form: Breast, AJCC 8th Edition - Pathologic stage from 11/14/2023: Stage IA (pT1c, pN0(sn), cM0, G3, ER+, PR+, HER2-, Oncotype DX score: 21) - Signed by Ronny Bacon, PA-C on 11/14/2023 Stage prefix: Initial diagnosis Method of lymph node assessment: Sentinel lymph node biopsy Multigene prognostic tests performed: Oncotype DX Recurrence score range: Greater than or equal to 11 Histologic grading system: 3 grade system     CHIEF COMPLIANT: Follow-up to review the pathology and Oncotype test results  HISTORY OF PRESENT ILLNESS:  History of Present Illness   Christie Molina is a 63 year old female with breast cancer who presents for follow-up on HER2 status and oncotype test results.  Initially, her HER2 status was reported as positive based on biopsy material, but subsequent testing on the surgical specimen showed HER2 negative. This discrepancy was explained as potentially due to heterogeneous staining or differences in the amount of tissue available for testing.  This score falls below the threshold where chemotherapy would be considered beneficial.  There was a discussion about the emotional impact of the initial HER2 positive result and the relief upon learning the final negative status.         ALLERGIES:  has no known allergies.  MEDICATIONS:  Current Outpatient Medications  Medication Sig Dispense Refill   cetirizine (ZYRTEC) 10 MG tablet Take 10 mg by mouth daily.     famotidine (PEPCID) 20 MG tablet Take 20 mg by mouth daily.  levothyroxine (SYNTHROID) 75 MCG tablet TAKE 1 TABLET(75 MCG) BY MOUTH DAILY 90 tablet 3   meloxicam (MOBIC) 7.5 MG tablet TAKE 1 TO 2 TABLETS BY MOUTH DAILY WITH A MEAL. 30  tablet 0   pantoprazole (PROTONIX) 20 MG tablet Take 20 mg by mouth daily. (Patient taking differently: Take 20 mg by mouth daily. TAKES SOMETIMES)     simvastatin (ZOCOR) 20 MG tablet TAKE 1 TABLET(20 MG) BY MOUTH EVERY EVENING 90 tablet 3   No current facility-administered medications for this visit.    PHYSICAL EXAMINATION: ECOG PERFORMANCE STATUS: 1 - Symptomatic but completely ambulatory  Vitals:   11/17/23 1057 11/17/23 1059  BP: (!) 143/68 129/69  Pulse: 64   Resp: 18   Temp: 98.1 F (36.7 C)   SpO2: 100%    Filed Weights   11/17/23 1057  Weight: 140 lb 9.6 oz (63.8 kg)      LABORATORY DATA:  I have reviewed the data as listed    Latest Ref Rng & Units 09/22/2023   12:42 PM 06/28/2023    9:22 AM 06/22/2022   11:10 AM  CMP  Glucose 70 - 99 mg/dL 95  91  88   BUN 8 - 23 mg/dL 12  11  11    Creatinine 0.44 - 1.00 mg/dL 9.60  4.54  0.98   Sodium 135 - 145 mmol/L 141  141  141   Potassium 3.5 - 5.1 mmol/L 4.2  4.9  4.6   Chloride 98 - 111 mmol/L 106  104  103   CO2 22 - 32 mmol/L 31  29  30    Calcium 8.9 - 10.3 mg/dL 9.2  9.7  9.8   Total Protein 6.5 - 8.1 g/dL 6.5  7.0  6.9   Total Bilirubin <1.2 mg/dL 0.4  0.7  0.6   Alkaline Phos 38 - 126 U/L 59     AST 15 - 41 U/L 19  20  23    ALT 0 - 44 U/L 17  15  13      Lab Results  Component Value Date   WBC 4.2 09/22/2023   HGB 13.5 09/22/2023   HCT 40.5 09/22/2023   MCV 90.6 09/22/2023   PLT 227 09/22/2023   NEUTROABS 2.3 09/22/2023    ASSESSMENT & PLAN:  Malignant neoplasm of upper-outer quadrant of right breast in female, estrogen receptor positive (HCC) 09/10/2023: Screening mammogram detected right breast mass UOQ: 0.8 cm at 10:00, axilla negative, biopsy: Grade 2 IDC ER 100%, PR 80%, Ki67 10%, HER2 positive by FISH ratio 3.26 copy #7    10/13/2023: Right lumpectomy: Invasive poorly differentiated adenocarcinoma grade 3, DCIS intermediate grade, 1.7 cm, margins negative, negative for angiolymphatic invasion, 0/5  lymph nodes negative, ER 100%, PR 80%, HER2 positive on biopsy, Ki-67 10% Repeat breast prognostic panel: ER 95%, PR 60%, Ki67 10%, HER2 1+ negative Oncotype DX recurrence score: 21 (7% risk of distant recurrence at 9 years)   Treatment plan: Adjuvant radiation Adjuvant antiestrogen therapy ------------------------------------------------------------------------------------------------------------------- Lengthy conversation about how the biopsy material was HER2 positive but the final pathology was HER2 negative. Oncotype DX test also showed that the material was HER2 negative. Possible causes could be tumor heterogeneity versus insufficient/scant material of the biopsy potentially causing abnormal report  Return to clinic after radiation is complete   No orders of the defined types were placed in this encounter.  The patient has a good understanding of the overall plan. she agrees with it. she will call with any problems  that may develop before the next visit here. Total time spent: 30 mins including face to face time and time spent for planning, charting and co-ordination of care   Tamsen Meek, MD 11/17/23

## 2023-11-17 NOTE — Assessment & Plan Note (Signed)
09/10/2023: Screening mammogram detected right breast mass UOQ: 0.8 cm at 10:00, axilla negative, biopsy: Grade 2 IDC ER 100%, PR 80%, Ki67 10%, HER2 positive by FISH ratio 3.26 copy #7    10/13/2023: Right lumpectomy: Invasive poorly differentiated adenocarcinoma grade 3, DCIS intermediate grade, 1.7 cm, margins negative, negative for angiolymphatic invasion, 0/5 lymph nodes negative, ER 100%, PR 80%, HER2 positive on biopsy, Ki-67 10% Repeat breast prognostic panel: ER 95%, PR 60%, Ki67 10%, HER2 1+ negative Oncotype DX recurrence score: 21 (7% risk of distant recurrence at 9 years)   Treatment plan: Adjuvant radiation Adjuvant antiestrogen therapy ------------------------------------------------------------------------------------------------------------------- Return to clinic after radiation is complete

## 2023-11-18 ENCOUNTER — Ambulatory Visit: Payer: 59

## 2023-11-22 NOTE — Therapy (Signed)
OUTPATIENT PHYSICAL THERAPY  UPPER EXTREMITY ONCOLOGY EVALUATION  Patient Name: Christie Molina MRN: [id number] DOB:04/04/1961, 63 y.o., female Today's Date: 11/23/2023  END OF SESSION:  PT End of Session - 11/23/23 1713     Visit Number 1    Number of Visits 4    Date for PT Re-Evaluation 12/14/23    Authorization Type none needed    PT Start Time 1600    PT Stop Time 1640    PT Time Calculation (min) 40 min    Activity Tolerance Patient tolerated treatment well    Behavior During Therapy WFL for tasks assessed/performed             Past Medical History:  Diagnosis Date   Breast cancer (HCC)    right breast IDC   GERD (gastroesophageal reflux disease)    Hyperlipidemia    Hypothyroidism    Thyroid disease    Vitamin D deficiency    Past Surgical History:  Procedure Laterality Date   BREAST LUMPECTOMY WITH RADIOACTIVE SEED AND SENTINEL LYMPH NODE BIOPSY Right 10/13/2023   Procedure: RIGHT BREAST LUMPECTOMY WITH RADIOACTIVE SEED AND SENTINEL LYMPH NODE BIOPSY;  Surgeon: Griselda Miner, MD;  Location: [facility];  Service: General;  Laterality: Right;   COLONOSCOPY WITH PROPOFOL N/A 07/24/2014   Procedure: COLONOSCOPY WITH PROPOFOL;  Surgeon: Charolett Bumpers, MD;  Location: [facility];  Service: Endoscopy;  Laterality: N/A;   UPPER GASTROINTESTINAL ENDOSCOPY  08/2018   Patient Active Problem List   Diagnosis Date Noted   Genetic testing 10/01/2023   Malignant neoplasm of upper-outer quadrant of right breast in female, estrogen receptor positive (HCC) 09/20/2023   Hyperplastic polyps of stomach 06/05/2019   Anxiety state 07/06/2013   Hypothyroidism 06/29/2011   GE reflux 06/29/2011   Hyperlipidemia 06/29/2011    PCP: Marlan Palau, MD  REFERRING PROVIDER: Serena Croissant, MD  REFERRING DIAG:  Diagnosis  C50.411,Z17.0 (ICD-10-CM) - Malignant neoplasm of upper-outer quadrant of right breast in female, estrogen receptor positive (HCC)    THERAPY  DIAG:  Malignant neoplasm of upper-outer quadrant of right breast in female, estrogen receptor positive [facility])  Aftercare following surgery for neoplasm  Pain in right upper arm  ONSET DATE: 09/10/23  Rationale for Evaluation and Treatment: Rehabilitation  SUBJECTIVE:                                                                                                                                                                                           SUBJECTIVE STATEMENT: I was talking to someone in radiation about how my arm felt tight.  It is the Rt upper arm.  Denies swelling   PERTINENT HISTORY: Patient was diagnosed on 09/10/2023 with right grade 2 invasive ductal carcinoma breast cancer. It measures 8 mm and is located in the upper outer quadrant. It is ER/PR positive, HER2 neg with a Ki67 of 10%. 10/13/23- R breast lumpectomy and SLNB (0/5) Will have radiation.   PAIN:  Are you having pain? No  it just feels tight   NPRS scale: 5/10 (tightness) Pain location: Rt biceps region Pain orientation: Right  PAIN TYPE: tight Pain description: constant  Aggravating factors: using it on the steering wheel, I try not to pick up anything,  even just working on my computer.   Relieving factors: nothing   PRECAUTIONS: Rt lymphedema risk   RED FLAGS: None   WEIGHT BEARING RESTRICTIONS: No  FALLS:  Has patient fallen in last 6 months? No  LIVING ENVIRONMENT: Lives with: lives with their spouse  OCCUPATION: works full time, desk job   LEISURE: nothing   HAND DOMINANCE: right   PRIOR LEVEL OF FUNCTION: Independent  PATIENT GOALS: see if I can get my arm to feel less tight     OBJECTIVE: Note: Objective measures were completed at Evaluation unless otherwise noted.  COGNITION: Overall cognitive status: Within functional limits for tasks assessed   PALPATION: No ttp to musculature  OBSERVATIONS / OTHER ASSESSMENTS: cording may be present but harder to palpate and deeper    POSTURE: rounded shoulders   UPPER EXTREMITY AROM/PROM:  A/PROM RIGHT   Post op  11/23/23  Shoulder extension 81 81  Shoulder flexion 162 145  Shoulder abduction 174 170  Shoulder internal rotation 67 70  Shoulder external rotation 84 85    (Blank rows = not tested)  A/PROM LEFT   eval  Shoulder extension 81  Shoulder flexion 158  Shoulder abduction 175  Shoulder internal rotation 70  Shoulder external rotation 85    (Blank rows = not tested)  UPPER EXTREMITY STRENGTH:  4+/5 bil Ues, some pain with resisted abduction on the Rt   QUICK DASH SURVEY: 9%                                                                                                                            TREATMENT DATE:  11/23/23 Eval performed Supine STM with cocoa butter to upper arm, bicep, axilla as cording would present and be treated despite not feeling certain cords PROM into flexion and D2 Exercise given for HEP with performance x each:  Dowel flexion   Doorway stretch  Single arm wall chest stretch    PATIENT EDUCATION:  Education details: per today's note, POC Person educated: Patient Education method: Explanation, Demonstration, Tactile cues, Verbal cues, and Handouts Education comprehension: verbalized understanding, returned demonstration, and needs further education  HOME EXERCISE PROGRAM: Access Code: [email or web address] URL: [email or web address] Date: 11/23/2023 Prepared by: Gwenevere Abbot  Exercises - Supine Shoulder Flexion AAROM with Hands Clasped  - 1 x daily - 7 x weekly - 1-3 sets -  10 reps - 3 seconds hold - Doorway Pec Stretch at 90 Degrees Abduction  - 1 x daily - 7 x weekly - 1 sets - 3 reps - 20-30 seconds hold - Standing Pec Stretch at Wall  - 1 x daily - 7 x weekly - 1 sets - 3 reps - 20-30 seconds hold  ASSESSMENT:  CLINICAL IMPRESSION: Patient is a 63 y.o. female who was seen today for physical therapy evaluation and treatment for her Rt upper arm  tightness.  She denies pain but that it is a constant mid bicep region tightness that does not seem to improve or worsen much, except it is noticed more with driving and working all day.  The pain and tightness was not reproduced on exam today, but felt the most similar when doing a single arm bicep/chest stretch on the wall.  deficits were noted in flexion end range and possible cording noted but not visible.    OBJECTIVE IMPAIRMENTS: decreased ROM and increased fascial restrictions.   ACTIVITY LIMITATIONS: carrying and lifting  PARTICIPATION LIMITATIONS: none  PERSONAL FACTORS: 1-2 comorbidities: SLNB  are also affecting patient's functional outcome.   REHAB POTENTIAL: Excellent  CLINICAL DECISION MAKING: Stable/uncomplicated  EVALUATION COMPLEXITY: Low  GOALS: Goals reviewed with patient? Yes  SHORT TERM GOALS=LTGs: Target date: 12/21/23  Pt will report no resting tightness in the Rt upper arm  Baseline:5/10 tightness not pain Goal status: INITIAL  2.  Pt will return to driving without increased pull in the Rt upper arm  Baseline:  Goal status: INITIAL  3.  Pt will be ind with final HEP for after radiation  Baseline:  Goal status: INITIAL   PLAN:  PT FREQUENCY: 1x/week  PT DURATION: 4 weeks  PLANNED INTERVENTIONS: 97110-Therapeutic exercises, 97535- Self Care, 16109- Manual therapy, Patient/Family education, Therapeutic exercises, Therapeutic activity, Neuromuscular re-education, and Self Care  PLAN FOR NEXT SESSION: Rt UE treat as cording to decrease Rt upper arm tightness   Bertran Zeimet, Julieanne Manson, PT 11/23/2023, 5:13 PM

## 2023-11-23 ENCOUNTER — Other Ambulatory Visit: Payer: Self-pay

## 2023-11-23 ENCOUNTER — Encounter: Payer: Self-pay | Admitting: Rehabilitation

## 2023-11-23 ENCOUNTER — Telehealth: Payer: Self-pay | Admitting: Internal Medicine

## 2023-11-23 ENCOUNTER — Ambulatory Visit: Payer: 59 | Attending: Hematology and Oncology | Admitting: Rehabilitation

## 2023-11-23 DIAGNOSIS — M79621 Pain in right upper arm: Secondary | ICD-10-CM | POA: Insufficient documentation

## 2023-11-23 DIAGNOSIS — Z170 Estrogen receptor positive status [ER+]: Secondary | ICD-10-CM | POA: Insufficient documentation

## 2023-11-23 DIAGNOSIS — Z483 Aftercare following surgery for neoplasm: Secondary | ICD-10-CM | POA: Insufficient documentation

## 2023-11-23 DIAGNOSIS — C50411 Malignant neoplasm of upper-outer quadrant of right female breast: Secondary | ICD-10-CM | POA: Diagnosis present

## 2023-11-23 NOTE — Telephone Encounter (Signed)
Copied from CRM [id number]. Topic: General - Other >> Nov 23, 2023  3:21 PM Whitney O wrote: Reason for CRM: Christie Molina from [facility] is needing all patients breast images   Christie Molina is needing the results from the breast imaging . All the results the doctor has will be perfect . Patient comes in tomorrow and ms Christie Molina needs this information asap  [phone] fax number

## 2023-11-23 NOTE — Telephone Encounter (Signed)
[facility] notes were faxed

## 2023-12-02 ENCOUNTER — Ambulatory Visit
Admission: RE | Admit: 2023-12-02 | Discharge: 2023-12-02 | Disposition: A | Discharge status: Home / Self Care | Payer: 59 | Source: Ambulatory Visit | Attending: Radiation Oncology | Admitting: Radiation Oncology

## 2023-12-02 DIAGNOSIS — C50411 Malignant neoplasm of upper-outer quadrant of right female breast: Secondary | ICD-10-CM | POA: Insufficient documentation

## 2023-12-02 DIAGNOSIS — Z170 Estrogen receptor positive status [ER+]: Secondary | ICD-10-CM | POA: Diagnosis not present

## 2023-12-07 ENCOUNTER — Encounter: Payer: Self-pay | Admitting: Rehabilitation

## 2023-12-07 ENCOUNTER — Ambulatory Visit: Payer: 59 | Attending: Hematology and Oncology | Admitting: Rehabilitation

## 2023-12-07 DIAGNOSIS — C50411 Malignant neoplasm of upper-outer quadrant of right female breast: Secondary | ICD-10-CM | POA: Insufficient documentation

## 2023-12-07 DIAGNOSIS — R293 Abnormal posture: Secondary | ICD-10-CM | POA: Insufficient documentation

## 2023-12-07 DIAGNOSIS — Z483 Aftercare following surgery for neoplasm: Secondary | ICD-10-CM | POA: Insufficient documentation

## 2023-12-07 DIAGNOSIS — M79621 Pain in right upper arm: Secondary | ICD-10-CM | POA: Insufficient documentation

## 2023-12-07 DIAGNOSIS — Z170 Estrogen receptor positive status [ER+]: Secondary | ICD-10-CM | POA: Diagnosis present

## 2023-12-07 NOTE — Therapy (Signed)
 OUTPATIENT PHYSICAL THERAPY  UPPER EXTREMITY ONCOLOGY  Patient Name: Christie Molina MRN: [id number] DOB:14-Jul-1961, 63 y.o., female Today's Date: 12/07/2023  END OF SESSION:  PT End of Session - 12/07/23 1459     Visit Number 2    Number of Visits 4    Date for PT Re-Evaluation 12/14/23    PT Start Time 1500    PT Stop Time 1535    PT Time Calculation (min) 35 min    Activity Tolerance Patient tolerated treatment well    Behavior During Therapy WFL for tasks assessed/performed             Past Medical History:  Diagnosis Date   Breast cancer (HCC)    right breast IDC   GERD (gastroesophageal reflux disease)    Hyperlipidemia    Hypothyroidism    Thyroid disease    Vitamin D deficiency    Past Surgical History:  Procedure Laterality Date   BREAST LUMPECTOMY WITH RADIOACTIVE SEED AND SENTINEL LYMPH NODE BIOPSY Right 10/13/2023   Procedure: RIGHT BREAST LUMPECTOMY WITH RADIOACTIVE SEED AND SENTINEL LYMPH NODE BIOPSY;  Surgeon: Griselda Miner, MD;  Location: [facility];  Service: General;  Laterality: Right;   COLONOSCOPY WITH PROPOFOL N/A 07/24/2014   Procedure: COLONOSCOPY WITH PROPOFOL;  Surgeon: Charolett Bumpers, MD;  Location: [facility];  Service: Endoscopy;  Laterality: N/A;   UPPER GASTROINTESTINAL ENDOSCOPY  08/2018   Patient Active Problem List   Diagnosis Date Noted   Genetic testing 10/01/2023   Malignant neoplasm of upper-outer quadrant of right breast in female, estrogen receptor positive (HCC) 09/20/2023   Hyperplastic polyps of stomach 06/05/2019   Anxiety state 07/06/2013   Hypothyroidism 06/29/2011   GE reflux 06/29/2011   Hyperlipidemia 06/29/2011    PCP: Marlan Palau, MD  REFERRING PROVIDER: Serena Croissant, MD  REFERRING DIAG:  Diagnosis  C50.411,Z17.0 (ICD-10-CM) - Malignant neoplasm of upper-outer quadrant of right breast in female, estrogen receptor positive (HCC)    THERAPY DIAG:  Malignant neoplasm of upper-outer  quadrant of right breast in female, estrogen receptor positive (HCC)  Aftercare following surgery for neoplasm  Pain in right upper arm  Abnormal posture  ONSET DATE: 09/10/23  Rationale for Evaluation and Treatment: Rehabilitation  SUBJECTIVE:                                                                                                                                                                                           SUBJECTIVE STATEMENT: it really feels fine now   PERTINENT HISTORY: Patient was diagnosed on 09/10/2023 with right grade 2 invasive ductal carcinoma breast cancer. It measures  8 mm and is located in the upper outer quadrant. It is ER/PR positive, HER2 neg with a Ki67 of 10%. 10/13/23- R breast lumpectomy and SLNB (0/5) Will have radiation.   PAIN:  Are you having pain? No    PRECAUTIONS: Rt lymphedema risk   RED FLAGS: None   WEIGHT BEARING RESTRICTIONS: No  FALLS:  Has patient fallen in last 6 months? No  LIVING ENVIRONMENT: Lives with: lives with their spouse  OCCUPATION: works full time, desk job   LEISURE: nothing   HAND DOMINANCE: right   PRIOR LEVEL OF FUNCTION: Independent  PATIENT GOALS: see if I can get my arm to feel less tight     OBJECTIVE: Note: Objective measures were completed at Evaluation unless otherwise noted.  COGNITION: Overall cognitive status: Within functional limits for tasks assessed   PALPATION: No ttp to musculature  OBSERVATIONS / OTHER ASSESSMENTS: cording may be present but harder to palpate and deeper   POSTURE: rounded shoulders   UPPER EXTREMITY AROM/PROM:  A/PROM RIGHT   Post op  11/23/23 12/07/23  Shoulder extension 81 81   Shoulder flexion 162 145 162  Shoulder abduction 174 170   Shoulder internal rotation 67 70   Shoulder external rotation 84 85     (Blank rows = not tested)  A/PROM LEFT   eval  Shoulder extension 81  Shoulder flexion 158  Shoulder abduction 175  Shoulder internal rotation  70  Shoulder external rotation 85    (Blank rows = not tested)  UPPER EXTREMITY STRENGTH:  4+/5 bil Ues, some pain with resisted abduction on the Rt   QUICK DASH SURVEY: 9%                                                                                                                            TREATMENT DATE:  12/07/23 Therapeutic Exercise Pulleys into flexion and abduction x each (overall easy) done for warmup  Single arm doorway stretch 3x20" right only Doorway stretch bil 3x20"  Supine dowel flexion 5" x 5 Snow angel x 10 (overall easy)  Manual therapy  Supine STM with cocoa butter to upper arm, bicep, axilla as cording would present and be treated despite not feeling certain cords PROM into flexion, abduction, and D2 flexion - no restrictions noted   11/23/23 Eval performed Supine STM with cocoa butter to upper arm, bicep, axilla as cording would present and be treated despite not feeling certain cords PROM into flexion and D2 Exercise given for HEP with performance x each:  Dowel flexion   Doorway stretch  Single arm wall chest stretch    PATIENT EDUCATION:  Education details: per today's note, POC Person educated: Patient Education method: Explanation, Demonstration, Tactile cues, Verbal cues, and Handouts Education comprehension: verbalized understanding, returned demonstration, and needs further education  HOME EXERCISE PROGRAM: Access Code: [email or web address] URL: [email or web address] Date: 11/23/2023 Prepared by: Gwenevere Abbot  Exercises - Supine Shoulder Flexion AAROM with Hands Clasped  -  1 x daily - 7 x weekly - 1-3 sets - 10 reps - 3 seconds hold - Doorway Pec Stretch at 90 Degrees Abduction  - 1 x daily - 7 x weekly - 1 sets - 3 reps - 20-30 seconds hold - Standing Pec Stretch at Wall  - 1 x daily - 7 x weekly - 1 sets - 3 reps - 20-30 seconds hold  ASSESSMENT:  CLINICAL IMPRESSION:  Pt has met all goals and is ready for DC.  She feels much  better after the first session and has no overall pull.  She will continue with [facility] screenings.    OBJECTIVE IMPAIRMENTS: decreased ROM and increased fascial restrictions.   ACTIVITY LIMITATIONS: carrying and lifting  PARTICIPATION LIMITATIONS: none  PERSONAL FACTORS: 1-2 comorbidities: SLNB  are also affecting patient's functional outcome.   REHAB POTENTIAL: Excellent  CLINICAL DECISION MAKING: Stable/uncomplicated  EVALUATION COMPLEXITY: Low  GOALS: Goals reviewed with patient? Yes  SHORT TERM GOALS=LTGs: Target date: 12/21/23  Pt will report no resting tightness in the Rt upper arm  Baseline:5/10 tightness not pain Goal status:MET  2.  Pt will return to driving without increased pull in the Rt upper arm  Baseline:  Goal status: MET  3.  Pt will be ind with final HEP for after radiation  Baseline:  Goal status: MET   PLAN:  PT FREQUENCY: 1x/week  PT DURATION: 4 weeks  PLANNED INTERVENTIONS: 97110-Therapeutic exercises, 97535- Self Care, 16109- Manual therapy, Patient/Family education, Therapeutic exercises, Therapeutic activity, Neuromuscular re-education, and Self Care  PLAN FOR NEXT SESSION: Rt UE treat as cording to decrease Rt upper arm tightness   PHYSICAL THERAPY DISCHARGE SUMMARY  Visits from Start of Care: 2  Current functional level related to goals / functional outcomes: Goals met   Remaining deficits: Lymphedema risk    Education / Equipment: Final HEP  Plan: Patient agrees to discharge.  Patient goals were met. Patient is being discharged due to meeting the stated rehab goals.        Idamae Lusher, PT 12/07/2023, 3:42 PM

## 2023-12-13 ENCOUNTER — Encounter: Payer: Self-pay | Admitting: *Deleted

## 2023-12-13 DIAGNOSIS — C50411 Malignant neoplasm of upper-outer quadrant of right female breast: Secondary | ICD-10-CM

## 2023-12-14 ENCOUNTER — Encounter: Payer: 59 | Admitting: Rehabilitation

## 2023-12-14 ENCOUNTER — Telehealth: Payer: Self-pay | Admitting: Hematology and Oncology

## 2023-12-15 ENCOUNTER — Ambulatory Visit
Admission: RE | Admit: 2023-12-15 | Discharge: 2023-12-15 | Disposition: A | Discharge status: Home / Self Care | Payer: 59 | Source: Ambulatory Visit | Attending: Radiation Oncology | Admitting: Radiation Oncology

## 2023-12-15 DIAGNOSIS — C50411 Malignant neoplasm of upper-outer quadrant of right female breast: Secondary | ICD-10-CM | POA: Insufficient documentation

## 2023-12-15 DIAGNOSIS — Z170 Estrogen receptor positive status [ER+]: Secondary | ICD-10-CM | POA: Insufficient documentation

## 2023-12-16 ENCOUNTER — Other Ambulatory Visit: Payer: Self-pay

## 2023-12-16 DIAGNOSIS — C50411 Malignant neoplasm of upper-outer quadrant of right female breast: Secondary | ICD-10-CM | POA: Diagnosis not present

## 2023-12-16 LAB — RAD ONC ARIA SESSION SUMMARY
Course Elapsed Days: 0
Plan Fractions Treated to Date: 1
Plan Prescribed Dose Per Fraction: 2.66 Gy
Plan Total Fractions Prescribed: 16
Plan Total Prescribed Dose: 42.56 Gy
Reference Point Dosage Given to Date: 2.66 Gy
Reference Point Session Dosage Given: 2.66 Gy
Session Number: 1

## 2023-12-17 ENCOUNTER — Ambulatory Visit
Admission: RE | Admit: 2023-12-17 | Discharge: 2023-12-17 | Disposition: A | Discharge status: Home / Self Care | Source: Ambulatory Visit | Attending: Radiation Oncology | Admitting: Radiation Oncology

## 2023-12-17 ENCOUNTER — Ambulatory Visit
Admission: RE | Admit: 2023-12-17 | Discharge: 2023-12-17 | Disposition: A | Discharge status: Home / Self Care | Payer: 59 | Source: Ambulatory Visit | Attending: Radiation Oncology | Admitting: Radiation Oncology

## 2023-12-17 ENCOUNTER — Other Ambulatory Visit: Payer: Self-pay

## 2023-12-17 DIAGNOSIS — C50411 Malignant neoplasm of upper-outer quadrant of right female breast: Secondary | ICD-10-CM | POA: Diagnosis not present

## 2023-12-17 DIAGNOSIS — Z170 Estrogen receptor positive status [ER+]: Secondary | ICD-10-CM

## 2023-12-17 LAB — RAD ONC ARIA SESSION SUMMARY
Course Elapsed Days: 1
Plan Fractions Treated to Date: 2
Plan Prescribed Dose Per Fraction: 2.66 Gy
Plan Total Fractions Prescribed: 16
Plan Total Prescribed Dose: 42.56 Gy
Reference Point Dosage Given to Date: 5.32 Gy
Reference Point Session Dosage Given: 2.66 Gy
Session Number: 2

## 2023-12-17 MED ORDER — ALRA NON-METALLIC DEODORANT (RAD-ONC)
1.0000 | Freq: Once | TOPICAL | Status: AC
  Administered 2023-12-17: 1 via TOPICAL

## 2023-12-17 MED ORDER — RADIAPLEXRX EX GEL
Freq: Once | CUTANEOUS | Status: AC
Start: 2023-12-17 — End: 2023-12-17

## 2023-12-20 ENCOUNTER — Ambulatory Visit
Admission: RE | Admit: 2023-12-20 | Discharge: 2023-12-20 | Disposition: A | Discharge status: Home / Self Care | Payer: 59 | Source: Ambulatory Visit | Attending: Radiation Oncology

## 2023-12-20 ENCOUNTER — Other Ambulatory Visit: Payer: Self-pay

## 2023-12-20 DIAGNOSIS — C50411 Malignant neoplasm of upper-outer quadrant of right female breast: Secondary | ICD-10-CM | POA: Diagnosis not present

## 2023-12-20 LAB — RAD ONC ARIA SESSION SUMMARY
Course Elapsed Days: 4
Plan Fractions Treated to Date: 3
Plan Prescribed Dose Per Fraction: 2.66 Gy
Plan Total Fractions Prescribed: 16
Plan Total Prescribed Dose: 42.56 Gy
Reference Point Dosage Given to Date: 7.98 Gy
Reference Point Session Dosage Given: 2.66 Gy
Session Number: 3

## 2023-12-21 ENCOUNTER — Other Ambulatory Visit: Payer: Self-pay

## 2023-12-21 ENCOUNTER — Ambulatory Visit
Admission: RE | Admit: 2023-12-21 | Discharge: 2023-12-21 | Disposition: A | Discharge status: Home / Self Care | Payer: 59 | Source: Ambulatory Visit | Attending: Radiation Oncology | Admitting: Radiation Oncology

## 2023-12-21 ENCOUNTER — Encounter: Payer: 59 | Admitting: Rehabilitation

## 2023-12-21 DIAGNOSIS — C50411 Malignant neoplasm of upper-outer quadrant of right female breast: Secondary | ICD-10-CM | POA: Diagnosis not present

## 2023-12-21 LAB — RAD ONC ARIA SESSION SUMMARY
Course Elapsed Days: 5
Plan Fractions Treated to Date: 4
Plan Prescribed Dose Per Fraction: 2.66 Gy
Plan Total Fractions Prescribed: 16
Plan Total Prescribed Dose: 42.56 Gy
Reference Point Dosage Given to Date: 10.64 Gy
Reference Point Session Dosage Given: 2.66 Gy
Session Number: 4

## 2023-12-22 ENCOUNTER — Other Ambulatory Visit: Payer: Self-pay

## 2023-12-22 ENCOUNTER — Ambulatory Visit
Admission: RE | Admit: 2023-12-22 | Discharge: 2023-12-22 | Disposition: A | Discharge status: Home / Self Care | Payer: 59 | Source: Ambulatory Visit | Attending: Radiation Oncology | Admitting: Radiation Oncology

## 2023-12-22 DIAGNOSIS — C50411 Malignant neoplasm of upper-outer quadrant of right female breast: Secondary | ICD-10-CM | POA: Diagnosis not present

## 2023-12-22 LAB — RAD ONC ARIA SESSION SUMMARY
Course Elapsed Days: 6
Plan Fractions Treated to Date: 5
Plan Prescribed Dose Per Fraction: 2.66 Gy
Plan Total Fractions Prescribed: 16
Plan Total Prescribed Dose: 42.56 Gy
Reference Point Dosage Given to Date: 13.3 Gy
Reference Point Session Dosage Given: 2.66 Gy
Session Number: 5

## 2023-12-23 ENCOUNTER — Ambulatory Visit
Admission: RE | Admit: 2023-12-23 | Discharge: 2023-12-23 | Disposition: A | Discharge status: Home / Self Care | Payer: 59 | Source: Ambulatory Visit | Attending: Radiation Oncology | Admitting: Radiation Oncology

## 2023-12-23 ENCOUNTER — Other Ambulatory Visit: Payer: Self-pay

## 2023-12-23 ENCOUNTER — Ambulatory Visit
Admission: RE | Admit: 2023-12-23 | Discharge: 2023-12-23 | Disposition: A | Discharge status: Home / Self Care | Source: Ambulatory Visit | Attending: Radiation Oncology | Admitting: Radiation Oncology

## 2023-12-23 DIAGNOSIS — C50411 Malignant neoplasm of upper-outer quadrant of right female breast: Secondary | ICD-10-CM | POA: Diagnosis not present

## 2023-12-23 LAB — RAD ONC ARIA SESSION SUMMARY
Course Elapsed Days: 7
Plan Fractions Treated to Date: 6
Plan Prescribed Dose Per Fraction: 2.66 Gy
Plan Total Fractions Prescribed: 16
Plan Total Prescribed Dose: 42.56 Gy
Reference Point Dosage Given to Date: 15.96 Gy
Reference Point Session Dosage Given: 2.66 Gy
Session Number: 6

## 2023-12-24 ENCOUNTER — Ambulatory Visit
Admission: RE | Admit: 2023-12-24 | Discharge: 2023-12-24 | Disposition: A | Discharge status: Home / Self Care | Payer: 59 | Source: Ambulatory Visit | Attending: Radiation Oncology | Admitting: Radiation Oncology

## 2023-12-24 ENCOUNTER — Other Ambulatory Visit: Payer: Self-pay

## 2023-12-24 DIAGNOSIS — C50411 Malignant neoplasm of upper-outer quadrant of right female breast: Secondary | ICD-10-CM | POA: Diagnosis not present

## 2023-12-24 LAB — RAD ONC ARIA SESSION SUMMARY
Course Elapsed Days: 8
Plan Fractions Treated to Date: 7
Plan Prescribed Dose Per Fraction: 2.66 Gy
Plan Total Fractions Prescribed: 16
Plan Total Prescribed Dose: 42.56 Gy
Reference Point Dosage Given to Date: 18.62 Gy
Reference Point Session Dosage Given: 2.66 Gy
Session Number: 7

## 2023-12-27 ENCOUNTER — Ambulatory Visit
Admission: RE | Admit: 2023-12-27 | Discharge: 2023-12-27 | Disposition: A | Discharge status: Home / Self Care | Payer: 59 | Source: Ambulatory Visit | Attending: Radiation Oncology | Admitting: Radiation Oncology

## 2023-12-27 ENCOUNTER — Other Ambulatory Visit: Payer: Self-pay

## 2023-12-27 DIAGNOSIS — C50411 Malignant neoplasm of upper-outer quadrant of right female breast: Secondary | ICD-10-CM | POA: Diagnosis not present

## 2023-12-27 LAB — RAD ONC ARIA SESSION SUMMARY
Course Elapsed Days: 11
Plan Fractions Treated to Date: 8
Plan Prescribed Dose Per Fraction: 2.66 Gy
Plan Total Fractions Prescribed: 16
Plan Total Prescribed Dose: 42.56 Gy
Reference Point Dosage Given to Date: 21.28 Gy
Reference Point Session Dosage Given: 2.66 Gy
Session Number: 8

## 2023-12-28 ENCOUNTER — Other Ambulatory Visit: Payer: Self-pay

## 2023-12-28 ENCOUNTER — Ambulatory Visit
Admission: RE | Admit: 2023-12-28 | Discharge: 2023-12-28 | Disposition: A | Discharge status: Home / Self Care | Payer: 59 | Source: Ambulatory Visit | Attending: Radiation Oncology | Admitting: Radiation Oncology

## 2023-12-28 DIAGNOSIS — C50411 Malignant neoplasm of upper-outer quadrant of right female breast: Secondary | ICD-10-CM | POA: Diagnosis not present

## 2023-12-28 LAB — RAD ONC ARIA SESSION SUMMARY
Course Elapsed Days: 12
Plan Fractions Treated to Date: 9
Plan Prescribed Dose Per Fraction: 2.66 Gy
Plan Total Fractions Prescribed: 16
Plan Total Prescribed Dose: 42.56 Gy
Reference Point Dosage Given to Date: 23.94 Gy
Reference Point Session Dosage Given: 2.66 Gy
Session Number: 9

## 2023-12-29 ENCOUNTER — Ambulatory Visit
Admission: RE | Admit: 2023-12-29 | Discharge: 2023-12-29 | Disposition: A | Discharge status: Home / Self Care | Payer: 59 | Source: Ambulatory Visit | Attending: Radiation Oncology

## 2023-12-29 ENCOUNTER — Other Ambulatory Visit: Payer: Self-pay

## 2023-12-29 DIAGNOSIS — C50411 Malignant neoplasm of upper-outer quadrant of right female breast: Secondary | ICD-10-CM | POA: Diagnosis not present

## 2023-12-29 LAB — RAD ONC ARIA SESSION SUMMARY
Course Elapsed Days: 13
Plan Fractions Treated to Date: 10
Plan Prescribed Dose Per Fraction: 2.66 Gy
Plan Total Fractions Prescribed: 16
Plan Total Prescribed Dose: 42.56 Gy
Reference Point Dosage Given to Date: 26.6 Gy
Reference Point Session Dosage Given: 2.66 Gy
Session Number: 10

## 2023-12-30 ENCOUNTER — Other Ambulatory Visit: Payer: Self-pay

## 2023-12-30 ENCOUNTER — Ambulatory Visit
Admission: RE | Admit: 2023-12-30 | Discharge: 2023-12-30 | Disposition: A | Discharge status: Home / Self Care | Payer: 59 | Source: Ambulatory Visit | Attending: Radiation Oncology | Admitting: Radiation Oncology

## 2023-12-30 DIAGNOSIS — C50411 Malignant neoplasm of upper-outer quadrant of right female breast: Secondary | ICD-10-CM | POA: Diagnosis not present

## 2023-12-30 LAB — RAD ONC ARIA SESSION SUMMARY
Course Elapsed Days: 14
Plan Fractions Treated to Date: 11
Plan Prescribed Dose Per Fraction: 2.66 Gy
Plan Total Fractions Prescribed: 16
Plan Total Prescribed Dose: 42.56 Gy
Reference Point Dosage Given to Date: 29.26 Gy
Reference Point Session Dosage Given: 2.66 Gy
Session Number: 11

## 2023-12-31 ENCOUNTER — Other Ambulatory Visit: Payer: Self-pay

## 2023-12-31 ENCOUNTER — Ambulatory Visit: Payer: 59 | Admitting: Radiation Oncology

## 2023-12-31 ENCOUNTER — Ambulatory Visit
Admission: RE | Admit: 2023-12-31 | Discharge: 2023-12-31 | Disposition: A | Discharge status: Home / Self Care | Source: Ambulatory Visit | Attending: Radiation Oncology | Admitting: Radiation Oncology

## 2023-12-31 ENCOUNTER — Ambulatory Visit
Admission: RE | Admit: 2023-12-31 | Discharge: 2023-12-31 | Disposition: A | Discharge status: Home / Self Care | Payer: 59 | Source: Ambulatory Visit | Attending: Radiation Oncology | Admitting: Radiation Oncology

## 2023-12-31 DIAGNOSIS — C50411 Malignant neoplasm of upper-outer quadrant of right female breast: Secondary | ICD-10-CM | POA: Diagnosis not present

## 2023-12-31 LAB — RAD ONC ARIA SESSION SUMMARY
Course Elapsed Days: 15
Plan Fractions Treated to Date: 12
Plan Prescribed Dose Per Fraction: 2.66 Gy
Plan Total Fractions Prescribed: 16
Plan Total Prescribed Dose: 42.56 Gy
Reference Point Dosage Given to Date: 31.92 Gy
Reference Point Session Dosage Given: 2.66 Gy
Session Number: 12

## 2024-01-03 ENCOUNTER — Ambulatory Visit
Admission: RE | Admit: 2024-01-03 | Discharge: 2024-01-03 | Disposition: A | Discharge status: Home / Self Care | Payer: 59 | Source: Ambulatory Visit | Attending: Radiation Oncology | Admitting: Radiation Oncology

## 2024-01-03 ENCOUNTER — Other Ambulatory Visit: Payer: Self-pay

## 2024-01-03 DIAGNOSIS — C50411 Malignant neoplasm of upper-outer quadrant of right female breast: Secondary | ICD-10-CM | POA: Diagnosis not present

## 2024-01-03 LAB — RAD ONC ARIA SESSION SUMMARY
Course Elapsed Days: 18
Plan Fractions Treated to Date: 13
Plan Prescribed Dose Per Fraction: 2.66 Gy
Plan Total Fractions Prescribed: 16
Plan Total Prescribed Dose: 42.56 Gy
Reference Point Dosage Given to Date: 34.58 Gy
Reference Point Session Dosage Given: 2.66 Gy
Session Number: 13

## 2024-01-04 ENCOUNTER — Other Ambulatory Visit: Payer: Self-pay

## 2024-01-04 ENCOUNTER — Ambulatory Visit
Admission: RE | Admit: 2024-01-04 | Discharge: 2024-01-04 | Disposition: A | Discharge status: Home / Self Care | Payer: 59 | Source: Ambulatory Visit | Attending: Radiation Oncology | Admitting: Radiation Oncology

## 2024-01-04 DIAGNOSIS — C50411 Malignant neoplasm of upper-outer quadrant of right female breast: Secondary | ICD-10-CM | POA: Insufficient documentation

## 2024-01-04 DIAGNOSIS — Z170 Estrogen receptor positive status [ER+]: Secondary | ICD-10-CM | POA: Diagnosis present

## 2024-01-04 LAB — RAD ONC ARIA SESSION SUMMARY
Course Elapsed Days: 19
Plan Fractions Treated to Date: 14
Plan Prescribed Dose Per Fraction: 2.66 Gy
Plan Total Fractions Prescribed: 16
Plan Total Prescribed Dose: 42.56 Gy
Reference Point Dosage Given to Date: 37.24 Gy
Reference Point Session Dosage Given: 2.66 Gy
Session Number: 14

## 2024-01-05 ENCOUNTER — Other Ambulatory Visit: Payer: Self-pay

## 2024-01-05 ENCOUNTER — Ambulatory Visit
Admission: RE | Admit: 2024-01-05 | Discharge: 2024-01-05 | Disposition: A | Discharge status: Home / Self Care | Payer: 59 | Source: Ambulatory Visit | Attending: Radiation Oncology | Admitting: Radiation Oncology

## 2024-01-05 DIAGNOSIS — C50411 Malignant neoplasm of upper-outer quadrant of right female breast: Secondary | ICD-10-CM | POA: Diagnosis not present

## 2024-01-05 LAB — RAD ONC ARIA SESSION SUMMARY
Course Elapsed Days: 20
Plan Fractions Treated to Date: 15
Plan Prescribed Dose Per Fraction: 2.66 Gy
Plan Total Fractions Prescribed: 16
Plan Total Prescribed Dose: 42.56 Gy
Reference Point Dosage Given to Date: 39.9 Gy
Reference Point Session Dosage Given: 2.66 Gy
Session Number: 15

## 2024-01-06 ENCOUNTER — Other Ambulatory Visit: Payer: Self-pay

## 2024-01-06 ENCOUNTER — Ambulatory Visit: Admitting: Hematology and Oncology

## 2024-01-06 ENCOUNTER — Ambulatory Visit
Admission: RE | Admit: 2024-01-06 | Discharge: 2024-01-06 | Disposition: A | Discharge status: Home / Self Care | Payer: 59 | Source: Ambulatory Visit | Attending: Radiation Oncology

## 2024-01-06 DIAGNOSIS — C50411 Malignant neoplasm of upper-outer quadrant of right female breast: Secondary | ICD-10-CM | POA: Diagnosis not present

## 2024-01-06 LAB — RAD ONC ARIA SESSION SUMMARY
Course Elapsed Days: 21
Plan Fractions Treated to Date: 16
Plan Prescribed Dose Per Fraction: 2.66 Gy
Plan Total Fractions Prescribed: 16
Plan Total Prescribed Dose: 42.56 Gy
Reference Point Dosage Given to Date: 42.56 Gy
Reference Point Session Dosage Given: 2.66 Gy
Session Number: 16

## 2024-01-07 ENCOUNTER — Ambulatory Visit
Admission: RE | Admit: 2024-01-07 | Discharge: 2024-01-07 | Disposition: A | Discharge status: Home / Self Care | Source: Ambulatory Visit | Attending: Radiation Oncology | Admitting: Radiation Oncology

## 2024-01-07 ENCOUNTER — Ambulatory Visit
Admission: RE | Admit: 2024-01-07 | Discharge: 2024-01-07 | Disposition: A | Discharge status: Home / Self Care | Payer: 59 | Source: Ambulatory Visit | Attending: Radiation Oncology | Admitting: Radiation Oncology

## 2024-01-07 ENCOUNTER — Other Ambulatory Visit: Payer: Self-pay

## 2024-01-07 DIAGNOSIS — C50411 Malignant neoplasm of upper-outer quadrant of right female breast: Secondary | ICD-10-CM | POA: Diagnosis not present

## 2024-01-07 LAB — RAD ONC ARIA SESSION SUMMARY
Course Elapsed Days: 22
Plan Fractions Treated to Date: 1
Plan Prescribed Dose Per Fraction: 2 Gy
Plan Total Fractions Prescribed: 4
Plan Total Prescribed Dose: 8 Gy
Reference Point Dosage Given to Date: 2 Gy
Reference Point Session Dosage Given: 2 Gy
Session Number: 17

## 2024-01-10 ENCOUNTER — Other Ambulatory Visit: Payer: Self-pay

## 2024-01-10 ENCOUNTER — Ambulatory Visit
Admission: RE | Admit: 2024-01-10 | Discharge: 2024-01-10 | Disposition: A | Discharge status: Home / Self Care | Payer: 59 | Source: Ambulatory Visit | Attending: Radiation Oncology | Admitting: Radiation Oncology

## 2024-01-10 DIAGNOSIS — C50411 Malignant neoplasm of upper-outer quadrant of right female breast: Secondary | ICD-10-CM | POA: Diagnosis not present

## 2024-01-10 LAB — RAD ONC ARIA SESSION SUMMARY
Course Elapsed Days: 25
Plan Fractions Treated to Date: 2
Plan Prescribed Dose Per Fraction: 2 Gy
Plan Total Fractions Prescribed: 4
Plan Total Prescribed Dose: 8 Gy
Reference Point Dosage Given to Date: 4 Gy
Reference Point Session Dosage Given: 2 Gy
Session Number: 18

## 2024-01-11 ENCOUNTER — Telehealth: Payer: Self-pay | Admitting: Adult Health

## 2024-01-11 ENCOUNTER — Other Ambulatory Visit: Payer: Self-pay

## 2024-01-11 ENCOUNTER — Ambulatory Visit
Admission: RE | Admit: 2024-01-11 | Discharge: 2024-01-11 | Disposition: A | Discharge status: Home / Self Care | Payer: 59 | Source: Ambulatory Visit | Attending: Radiation Oncology

## 2024-01-11 DIAGNOSIS — C50411 Malignant neoplasm of upper-outer quadrant of right female breast: Secondary | ICD-10-CM | POA: Diagnosis not present

## 2024-01-11 LAB — RAD ONC ARIA SESSION SUMMARY
Course Elapsed Days: 26
Plan Fractions Treated to Date: 3
Plan Prescribed Dose Per Fraction: 2 Gy
Plan Total Fractions Prescribed: 4
Plan Total Prescribed Dose: 8 Gy
Reference Point Dosage Given to Date: 6 Gy
Reference Point Session Dosage Given: 2 Gy
Session Number: 19

## 2024-01-11 NOTE — Telephone Encounter (Signed)
 Confirmed with pt about scheduled appt date and time

## 2024-01-12 ENCOUNTER — Other Ambulatory Visit: Payer: Self-pay

## 2024-01-12 ENCOUNTER — Ambulatory Visit
Admission: RE | Admit: 2024-01-12 | Discharge: 2024-01-12 | Disposition: A | Discharge status: Home / Self Care | Payer: 59 | Source: Ambulatory Visit | Attending: Radiation Oncology

## 2024-01-12 DIAGNOSIS — C50411 Malignant neoplasm of upper-outer quadrant of right female breast: Secondary | ICD-10-CM | POA: Diagnosis not present

## 2024-01-12 LAB — RAD ONC ARIA SESSION SUMMARY
Course Elapsed Days: 27
Plan Fractions Treated to Date: 4
Plan Prescribed Dose Per Fraction: 2 Gy
Plan Total Fractions Prescribed: 4
Plan Total Prescribed Dose: 8 Gy
Reference Point Dosage Given to Date: 8 Gy
Reference Point Session Dosage Given: 2 Gy
Session Number: 20

## 2024-01-13 NOTE — Radiation Completion Notes (Addendum)
  Radiation Oncology         [phone] ________________________________  Name: Christie Molina MRN: [id number]  Date of Service: 01/12/2024  DOB: [birth year]  End of Treatment Note   Diagnosis: Stage IA, [id number], grade 3, ER/PR positive invasive ductal carcinoma of the right breast.   Intent: Curative     ==========DELIVERED PLANS==========  First Treatment Date: 2023-12-16 Last Treatment Date: 2024-01-12   Plan Name: Breast_R Site: Breast, Right Technique: 3D Mode: Photon Dose Per Fraction: 2.66 Gy Prescribed Dose (Delivered / Prescribed): 42.56 Gy / 42.56 Gy Prescribed Fxs (Delivered / Prescribed): 16 / 16   Plan Name: Breast_Rt_Bst Site: Breast, Right Technique: 3D Mode: Photon Dose Per Fraction: 2 Gy Prescribed Dose (Delivered / Prescribed): 8 Gy / 8 Gy Prescribed Fxs (Delivered / Prescribed): 4 / 4     ==========ON TREATMENT VISIT DATES========== 2023-12-17, 2023-12-23, 2023-12-31, 2024-01-07    See weekly On Treatment Notes in Epic for details in the Media tab (listed as Progress notes on the On Treatment Visit Dates listed above). The patient tolerated radiation. She developed fatigue and anticipated skin changes in the treatment field.   The patient will receive a call in about one month from the radiation oncology department. She will continue follow up with Dr. Gudena as well.      Shelvia Dick, PAC

## 2024-02-07 ENCOUNTER — Other Ambulatory Visit: Payer: Self-pay | Admitting: Internal Medicine

## 2024-02-08 ENCOUNTER — Ambulatory Visit (INDEPENDENT_AMBULATORY_CARE_PROVIDER_SITE_OTHER): Admitting: Internal Medicine

## 2024-02-08 ENCOUNTER — Encounter: Payer: Self-pay | Admitting: Internal Medicine

## 2024-02-08 VITALS — BP 110/80 | HR 62

## 2024-02-08 DIAGNOSIS — J309 Allergic rhinitis, unspecified: Secondary | ICD-10-CM | POA: Diagnosis not present

## 2024-02-08 DIAGNOSIS — H6501 Acute serous otitis media, right ear: Secondary | ICD-10-CM | POA: Diagnosis not present

## 2024-02-08 MED ORDER — METHYLPREDNISOLONE ACETATE 80 MG/ML IJ SUSP
80.0000 mg | Freq: Once | INTRAMUSCULAR | Status: AC
Start: 2024-02-08 — End: 2024-02-08
  Administered 2024-02-08: 80 mg via INTRAMUSCULAR

## 2024-02-08 NOTE — Progress Notes (Addendum)
 Patient Care Team: Sylvan Evener, MD as PCP - General (Internal Medicine) Lillian Rein, MD as Consulting Physician (Gynecology) Alane Hsu, RN as Oncology Nurse Navigator Auther Bo, RN as Oncology Nurse Navigator Caralyn Chandler, MD as Consulting Physician (General Surgery) Cameron Cea, MD as Consulting Physician (Hematology and Oncology) Johna Myers, MD as Consulting Physician (Radiation Oncology)  Visit Date: 02/08/24  Subjective:   Chief Complaint  Patient presents with   Allergies  Patient GE:XBMWU D Vargo,Female DOB:June 17, 1961,62 y.o. XLK:[id number]   63 y.o. Female presents today for acute sick visit with Allergic Rhinitis symptoms. Says that she has been dealing with congestion and would like a steroid injection to improve her symptoms.  In late April she had surgery for breast cancer by Dr. Alethea Andes and is being followed by oncology.  Tumor was grade 2 intraductal carcinoma ER 100% PR 80% Ki-67 10% HER2 positive by FISH.  Has upcoming Oncology appointment in July.  Due for annual physical here in late September. History of hyperlipidemia treated with simvastatin . Hypothyroidism treated with levothyroxine , GE reflux treated with famotidine.  Dr. Sena Dam is GYN physician whom she sees annually.  No known drug allergies   Past Medical History:  Diagnosis Date   Breast cancer (HCC)    right breast IDC   GERD (gastroesophageal reflux disease)    Hyperlipidemia    Hypothyroidism    Thyroid disease    Vitamin D  deficiency   No Known Allergies  Family History  Problem Relation Age of Onset   Heart disease Father    Pancreatic cancer Maternal Aunt 31   Breast cancer Paternal Aunt 41 - 49   Breast cancer Cousin        maternal first cousin   Social history: Married with 2 daughters.  Husband is an Midwife for the [employer].  She retired from [employer] and [employer].  Non-smoker.  Review of Systems  HENT:  Positive for congestion.   No fever chills  or significant cough.   Objective:  Vitals: BP 110/80   Pulse 62   LMP 07/22/2013 (Exact Date)   SpO2 98%   Physical Exam Vitals and nursing note reviewed.  Constitutional:      General: She is not in acute distress.    Appearance: Normal appearance. She is not ill-appearing.  HENT:     Head: Normocephalic and atraumatic.     Right Ear: Ear canal and external ear normal.     Left Ear: Tympanic membrane, ear canal and external ear normal.     Ears:     Comments: Fluid behind Right TM    Mouth/Throat:     Mouth: Mucous membranes are moist.     Pharynx: Oropharynx is clear. No oropharyngeal exudate or posterior oropharyngeal erythema.  Pulmonary:     Effort: Pulmonary effort is normal.     Breath sounds: Normal breath sounds. No wheezing, rhonchi or rales.  Lymphadenopathy:     Cervical: No cervical adenopathy.  Skin:    General: Skin is warm and dry.  Neurological:     Mental Status: She is alert and oriented to person, place, and time. Mental status is at baseline.  Psychiatric:        Mood and Affect: Mood normal.        Behavior: Behavior normal.        Thought Content: Thought content normal.        Judgment: Judgment normal.     Results:  Studies Obtained And Personally Reviewed By Me: Labs:     Component Value Date/Time   NA 141 09/22/2023 1242   K 4.2 09/22/2023 1242   CL 106 09/22/2023 1242   CO2 31 09/22/2023 1242   GLUCOSE 95 09/22/2023 1242   BUN 12 09/22/2023 1242   CREATININE 0.92 09/22/2023 1242   CREATININE 0.87 06/28/2023 0922   CALCIUM 9.2 09/22/2023 1242   PROT 6.5 09/22/2023 1242   ALBUMIN 4.2 09/22/2023 1242   AST 19 09/22/2023 1242   ALT 17 09/22/2023 1242   ALKPHOS 59 09/22/2023 1242   BILITOT 0.4 09/22/2023 1242   GFRNONAA >60 09/22/2023 1242   GFRNONAA 74 06/17/2020 0925   GFRAA 86 06/17/2020 0925    Lab Results  Component Value Date   WBC 4.2 09/22/2023   HGB 13.5 09/22/2023   HCT 40.5 09/22/2023   MCV 90.6 09/22/2023   PLT 227  09/22/2023   Lab Results  Component Value Date   CHOL 212 (H) 06/28/2023   HDL 93 06/28/2023   LDLCALC 104 (H) 06/28/2023   TRIG 68 06/28/2023   CHOLHDL 2.3 06/28/2023   Lab Results  Component Value Date   TSH 0.65 06/28/2023    Assessment & Plan:   Bilateral Serous Otitis Media due to allergies.  Treat with Depo-Medrol .  Antibiotics not needed.  Allergic rhinitis-has acute bilateral serous otitis media today from that which will be treated with Depo-Medrol  80 mg IM.  Plan: Given Depo-Medrol  80 mg IM today in-office.  This usually works well for her and is her preference.     I,Emily Lagle,acting as a Neurosurgeon for Sylvan Evener, MD.,have documented all relevant documentation on the behalf of Sylvan Evener, MD,as directed by  Sylvan Evener, MD while in the presence of Sylvan Evener, MD.   I, Sylvan Evener, MD, have reviewed all documentation for this visit. The documentation on 02/13/24 for the exam, diagnosis, procedures, and orders are all accurate and complete.

## 2024-02-13 NOTE — Patient Instructions (Signed)
 It was a pleasure to see you today and we are glad your radiation therapy has been completed for breast cancer.  You have been diagnosed with acute bilateral serous otitis media.  We gave you 80 mg IM Depo-Medrol  today injection which usually works well for you.  Please call us  if not better in 5 to 7 days or sooner if worse.

## 2024-02-14 ENCOUNTER — Ambulatory Visit: Payer: 59 | Attending: General Surgery

## 2024-02-14 VITALS — Wt 136.0 lb

## 2024-02-14 DIAGNOSIS — Z483 Aftercare following surgery for neoplasm: Secondary | ICD-10-CM | POA: Insufficient documentation

## 2024-02-14 NOTE — Therapy (Signed)
  OUTPATIENT PHYSICAL THERAPY SOZO SCREENING NOTE   Patient Name: Christie Molina MRN: [id number] DOB:1961-03-03, 63 y.o., female Today's Date: 02/14/2024  PCP: Sylvan Evener, MD REFERRING PROVIDER: Caralyn Chandler, MD   PT End of Session - 02/14/24 1635     Visit Number 1   # unchanged due to screen only   PT Start Time 1633    PT Stop Time 1638    PT Time Calculation (min) 5 min    Activity Tolerance Patient tolerated treatment well    Behavior During Therapy WFL for tasks assessed/performed             Past Medical History:  Diagnosis Date   Breast cancer (HCC)    right breast IDC   GERD (gastroesophageal reflux disease)    Hyperlipidemia    Hypothyroidism    Thyroid disease    Vitamin D  deficiency    Past Surgical History:  Procedure Laterality Date   BREAST LUMPECTOMY WITH RADIOACTIVE SEED AND SENTINEL LYMPH NODE BIOPSY Right 10/13/2023   Procedure: RIGHT BREAST LUMPECTOMY WITH RADIOACTIVE SEED AND SENTINEL LYMPH NODE BIOPSY;  Surgeon: Caralyn Chandler, MD;  Location: [facility];  Service: General;  Laterality: Right;   COLONOSCOPY WITH PROPOFOL  N/A 07/24/2014   Procedure: COLONOSCOPY WITH PROPOFOL ;  Surgeon: Garrett Kallman, MD;  Location: [facility];  Service: Endoscopy;  Laterality: N/A;   UPPER GASTROINTESTINAL ENDOSCOPY  08/2018   Patient Active Problem List   Diagnosis Date Noted   Genetic testing 10/01/2023   Malignant neoplasm of upper-outer quadrant of right breast in female, estrogen receptor positive (HCC) 09/20/2023   Hyperplastic polyps of stomach 06/05/2019   Anxiety state 07/06/2013   Hypothyroidism 06/29/2011   GE reflux 06/29/2011   Hyperlipidemia 06/29/2011    REFERRING DIAG: right breast cancer at risk for lymphedema  THERAPY DIAG:  Aftercare following surgery for neoplasm  PERTINENT HISTORY: Patient was diagnosed on 09/10/2023 with right grade 2 invasive ductal carcinoma breast cancer. It measures 8 mm and is located in  the upper outer quadrant. It is ER/PR positive, HER2 neg with a Ki67 of 10%. 10/13/23- R breast lumpectomy and SLNB (0/5); Completed radiation 01/12/24  PRECAUTIONS: right UE Lymphedema risk, None  SUBJECTIVE: Pt returns for her first 3 month L-Dex screen.   PAIN:  Are you having pain? No  SOZO SCREENING: Patient was assessed today using the SOZO machine to determine the lymphedema index score. This was compared to her baseline score. It was determined that she is within the recommended range when compared to her baseline and no further action is needed at this time. She will continue SOZO screenings. These are done every 3 months for 2 years post operatively followed by every 6 months for 2 years, and then annually.   L-DEX FLOWSHEETS - 02/14/24 1600       L-DEX LYMPHEDEMA SCREENING   Measurement Type Unilateral    L-DEX MEASUREMENT EXTREMITY Upper Extremity    POSITION  Standing    DOMINANT SIDE Right    At Risk Side Right    BASELINE SCORE (UNILATERAL) 1.1    L-DEX SCORE (UNILATERAL) -1.1    VALUE CHANGE (UNILAT) -2.2               Denyce Flank, PTA 02/14/2024, 4:40 PM

## 2024-03-08 ENCOUNTER — Telehealth: Payer: Self-pay | Admitting: *Deleted

## 2024-03-08 NOTE — Telephone Encounter (Signed)
 Received call from patient that she was supposed to come in to talk with Dr. Gudena about anastrozole but has not seen him yet.  Called her and scheduled appt for 03/09/24 at 145pm.

## 2024-03-09 ENCOUNTER — Inpatient Hospital Stay: Attending: Hematology and Oncology | Admitting: Hematology and Oncology

## 2024-03-09 VITALS — BP 141/74 | HR 67 | Temp 97.6°F | Resp 18 | Ht 61.0 in | Wt 136.1 lb

## 2024-03-09 DIAGNOSIS — Z79811 Long term (current) use of aromatase inhibitors: Secondary | ICD-10-CM | POA: Insufficient documentation

## 2024-03-09 DIAGNOSIS — Z923 Personal history of irradiation: Secondary | ICD-10-CM | POA: Diagnosis not present

## 2024-03-09 DIAGNOSIS — K589 Irritable bowel syndrome without diarrhea: Secondary | ICD-10-CM | POA: Insufficient documentation

## 2024-03-09 DIAGNOSIS — Z79899 Other long term (current) drug therapy: Secondary | ICD-10-CM | POA: Insufficient documentation

## 2024-03-09 DIAGNOSIS — N6310 Unspecified lump in the right breast, unspecified quadrant: Secondary | ICD-10-CM | POA: Insufficient documentation

## 2024-03-09 DIAGNOSIS — M8580 Other specified disorders of bone density and structure, unspecified site: Secondary | ICD-10-CM | POA: Diagnosis not present

## 2024-03-09 DIAGNOSIS — C50411 Malignant neoplasm of upper-outer quadrant of right female breast: Secondary | ICD-10-CM | POA: Insufficient documentation

## 2024-03-09 DIAGNOSIS — Z170 Estrogen receptor positive status [ER+]: Secondary | ICD-10-CM | POA: Insufficient documentation

## 2024-03-09 DIAGNOSIS — Z1731 Human epidermal growth factor receptor 2 positive status: Secondary | ICD-10-CM | POA: Diagnosis not present

## 2024-03-09 DIAGNOSIS — Z1721 Progesterone receptor positive status: Secondary | ICD-10-CM | POA: Diagnosis not present

## 2024-03-09 DIAGNOSIS — Z79890 Hormone replacement therapy (postmenopausal): Secondary | ICD-10-CM | POA: Insufficient documentation

## 2024-03-09 DIAGNOSIS — L539 Erythematous condition, unspecified: Secondary | ICD-10-CM | POA: Insufficient documentation

## 2024-03-09 MED ORDER — ANASTROZOLE 1 MG PO TABS: 1.0000 mg | ORAL_TABLET | Freq: Every day | ORAL | 3 refills | Status: AC

## 2024-03-09 NOTE — Progress Notes (Signed)
 follow-up with Loris Ros in 1 month for  Patient Care Team: Sylvan Evener, MD as PCP - General (Internal Medicine) Lillian Rein, MD as Consulting Physician (Gynecology) Alane Hsu, RN as Oncology Nurse Navigator Auther Bo, RN as Oncology Nurse Navigator Caralyn Chandler, MD as Consulting Physician (General Surgery) Cameron Cea, MD as Consulting Physician (Hematology and Oncology) Johna Myers, MD as Consulting Physician (Radiation Oncology)  DIAGNOSIS:  Encounter Diagnosis  Name Primary?   Malignant neoplasm of upper-outer quadrant of right breast in female, estrogen receptor positive (HCC) Yes    SUMMARY OF ONCOLOGIC HISTORY: Oncology History  Malignant neoplasm of upper-outer quadrant of right breast in female, estrogen receptor positive (HCC)  09/10/2023 Initial Diagnosis   Screening mammogram detected right breast mass UOQ: 0.8 cm at 10:00, axilla negative, biopsy: Grade 2 IDC ER 100%, PR 80%, Ki67 10%, HER2 positive by FISH ratio 3.26 copy #7    09/21/2023 Cancer Staging   Staging form: Breast, AJCC 8th Edition - Clinical stage from 09/21/2023: Stage IA (cT1b, cN0, cM0, G2, ER+, PR+, HER2+) - Signed by Bettejane Brownie, PA-C on 09/21/2023 Stage prefix: Initial diagnosis Method of lymph node assessment: Clinical Histologic grading system: 3 grade system   10/13/2023 Surgery   Right lumpectomy: Invasive poorly differentiated adenocarcinoma grade 3, DCIS intermediate grade, 1.7 cm, margins negative, negative for angiolymphatic invasion, 0/5 lymph nodes negative, ER 100%, PR 80%, HER2 positive, Ki-67 10%   10/20/2023 Genetic Testing   Negative [facility] CancerNext Panel.  The report date is 10/20/2023.   The [employer] CancerNext Panel includes sequencing and rearrangement analysis for the following 39 genes: APC, ATM, BAP1, BARD1, BMPR1A, BRCA1, BRCA2, BRIP1, CDH1, CDKN2A, CHEK2, FH, FLCN, MET, MLH1, MSH2, MSH6, MUTYH, NF1, NTHL1, PALB2, PMS2, PTEN, RAD51C, RAD51D, SMAD4,  STK11, TP53, TSC1, TSC2, and VHL (sequencing and deletion/duplication); AXIN2, HOXB13, MBD4, MSH3, POLD1 and POLE (sequencing only); EPCAM and GREM1 (deletion/duplication only).  Of note, RNA analysis was unable to be completed due to insufficient sample quality.    11/14/2023 Cancer Staging   Staging form: Breast, AJCC 8th Edition - Pathologic stage from 11/14/2023: Stage IA (pT1c, pN0(sn), cM0, G3, ER+, PR+, HER2-, Oncotype DX score: 21) - Signed by Bettejane Brownie, PA-C on 11/14/2023 Stage prefix: Initial diagnosis Method of lymph node assessment: Sentinel lymph node biopsy Multigene prognostic tests performed: Oncotype DX Recurrence score range: Greater than or equal to 11 Histologic grading system: 3 grade system     CHIEF COMPLIANT: Follow-up to discuss antiestrogen therapy  HISTORY OF PRESENT ILLNESS:   History of Present Illness Christie Molina is a 63 year old female with breast cancer who presents for follow-up after completing radiation therapy.  She completed a four-week course of radiation therapy for breast cancer, experiencing skin redness as a side effect. She is considering anti-estrogen therapy, specifically anastrozole, to prevent recurrence. Concerns include potential joint stiffness and osteoporosis due to osteopenia, confirmed by a bone density test. She cannot take calcium supplements due to gastrointestinal discomfort. She is exploring options for anti-estrogen therapy, considering anastrozole or exemestane, depending on insurance coverage and side effect profile.     ALLERGIES:  has no known allergies.  MEDICATIONS:  Current Outpatient Medications  Medication Sig Dispense Refill   cetirizine (ZYRTEC) 10 MG tablet Take 10 mg by mouth daily.     famotidine (PEPCID) 20 MG tablet Take 20 mg by mouth daily.     levothyroxine  (SYNTHROID ) 75 MCG tablet TAKE 1 TABLET(75 MCG) BY MOUTH DAILY  90 tablet 3   meloxicam  (MOBIC ) 7.5 MG tablet TAKE 1 TO 2 TABLETS BY MOUTH  DAILY WITH A MEAL. 30 tablet 0   pantoprazole  (PROTONIX ) 20 MG tablet Take 20 mg by mouth daily. (Patient taking differently: Take 20 mg by mouth daily. TAKES SOMETIMES)     simvastatin  (ZOCOR ) 20 MG tablet TAKE 1 TABLET(20 MG) BY MOUTH EVERY EVENING 90 tablet 3   No current facility-administered medications for this visit.    PHYSICAL EXAMINATION: ECOG PERFORMANCE STATUS: 1 - Symptomatic but completely ambulatory  Vitals:   03/09/24 1412  BP: (!) 141/74  Pulse: 67  Resp: 18  Temp: 97.6 F (36.4 C)  SpO2: 100%   Filed Weights   03/09/24 1412  Weight: 136 lb 1.6 oz (61.7 kg)      LABORATORY DATA:  I have reviewed the data as listed    Latest Ref Rng & Units 09/22/2023   12:42 PM 06/28/2023    9:22 AM 06/22/2022   11:10 AM  CMP  Glucose 70 - 99 mg/dL 95  91  88   BUN 8 - 23 mg/dL 12  11  11    Creatinine 0.44 - 1.00 mg/dL 1.61  0.96  0.45   Sodium 135 - 145 mmol/L 141  141  141   Potassium 3.5 - 5.1 mmol/L 4.2  4.9  4.6   Chloride 98 - 111 mmol/L 106  104  103   CO2 22 - 32 mmol/L 31  29  30    Calcium 8.9 - 10.3 mg/dL 9.2  9.7  9.8   Total Protein 6.5 - 8.1 g/dL 6.5  7.0  6.9   Total Bilirubin <1.2 mg/dL 0.4  0.7  0.6   Alkaline Phos 38 - 126 U/L 59     AST 15 - 41 U/L 19  20  23    ALT 0 - 44 U/L 17  15  13      Lab Results  Component Value Date   WBC 4.2 09/22/2023   HGB 13.5 09/22/2023   HCT 40.5 09/22/2023   MCV 90.6 09/22/2023   PLT 227 09/22/2023   NEUTROABS 2.3 09/22/2023    ASSESSMENT & PLAN:  Malignant neoplasm of upper-outer quadrant of right breast in female, estrogen receptor positive (HCC) 09/10/2023: Screening mammogram detected right breast mass UOQ: 0.8 cm at 10:00, axilla negative, biopsy: Grade 2 IDC ER 100%, PR 80%, Ki67 10%, HER2 positive by FISH ratio 3.26 copy #7    10/13/2023: Right lumpectomy: Invasive poorly differentiated adenocarcinoma grade 3, DCIS intermediate grade, 1.7 cm, margins negative, negative for angiolymphatic invasion, 0/5  lymph nodes negative, ER 100%, PR 80%, HER2 positive on biopsy, Ki-67 10% Repeat breast prognostic panel: ER 95%, PR 60%, Ki67 10%, HER2 1+ negative Oncotype DX recurrence score: 21 (7% risk of distant recurrence at 9 years)   Treatment plan: Adjuvant radiation 12/17/2023-01/12/2024 Adjuvant antiestrogen therapy anastrozole started 03/09/2024 (patient will start taking every other day and see how she tolerates it and then decide) ------------------------------------------------------------------------------------------------------------------- Anastrozole counseling: We discussed the risks and benefits of anti-estrogen therapy with aromatase inhibitors. These include but not limited to insomnia, hot flashes, mood changes, vaginal dryness, bone density loss, and weight gain. We strongly believe that the benefits far outweigh the risks. Patient understands these risks and consented to starting treatment. Planned treatment duration is 7 years.  She has a follow-up in 1 month for survivorship care plan visit   Assessment & Plan Malignant neoplasm of breast Completed radiation for ER-positive breast  cancer. Anastrozole recommended to reduce recurrence risk by 50%. Discussed side effects and bone health monitoring. Considered exemestane as an alternative due to insurance and side effect profile. - Initiate anastrozole 1 mg daily, start every other day for 1-2 weeks to assess tolerance. - Consider exemestane if anastrozole not tolerated. - Order Gardant blood test every six months for recurrence monitoring. - Schedule annual mammograms.  Osteopenia Osteopenia noted on bone density scan. Anti-estrogen therapy may worsen bone loss. - Monitor bone density every two years.  Irritable bowel syndrome (IBS) Discussed potential gastrointestinal side effects of anastrozole, including rare diarrhea. - Start anastrozole every other day to assess gastrointestinal tolerance.      No orders of the defined  types were placed in this encounter.  The patient has a good understanding of the overall plan. she agrees with it. she will call with any problems that may develop before the next visit here. Total time spent: 30 mins including face to face time and time spent for planning, charting and co-ordination of care   Viinay K Sloan Galentine, MD 03/09/24

## 2024-03-09 NOTE — Assessment & Plan Note (Signed)
 09/10/2023: Screening mammogram detected right breast mass UOQ: 0.8 cm at 10:00, axilla negative, biopsy: Grade 2 IDC ER 100%, PR 80%, Ki67 10%, HER2 positive by FISH ratio 3.26 copy #7    10/13/2023: Right lumpectomy: Invasive poorly differentiated adenocarcinoma grade 3, DCIS intermediate grade, 1.7 cm, margins negative, negative for angiolymphatic invasion, 0/5 lymph nodes negative, ER 100%, PR 80%, HER2 positive on biopsy, Ki-67 10% Repeat breast prognostic panel: ER 95%, PR 60%, Ki67 10%, HER2 1+ negative Oncotype DX recurrence score: 21 (7% risk of distant recurrence at 9 years)   Treatment plan: Adjuvant radiation 12/17/2023-01/12/2024 Adjuvant antiestrogen therapy anastrozole started 03/09/2024 ------------------------------------------------------------------------------------------------------------------- Anastrozole counseling: We discussed the risks and benefits of anti-estrogen therapy with aromatase inhibitors. These include but not limited to insomnia, hot flashes, mood changes, vaginal dryness, bone density loss, and weight gain. We strongly believe that the benefits far outweigh the risks. Patient understands these risks and consented to starting treatment. Planned treatment duration is 7 years.  Return to clinic in 3 months for survivorship care plan visit

## 2024-03-10 ENCOUNTER — Telehealth: Payer: Self-pay

## 2024-03-10 NOTE — Telephone Encounter (Signed)
 Per md orders entered for Guardant Reveal and all supported documents faxed to [phone]. Faxed confirmation was received.

## 2024-04-05 ENCOUNTER — Telehealth: Payer: Self-pay | Admitting: *Deleted

## 2024-04-05 NOTE — Telephone Encounter (Signed)
 Per MD request RN placed call to pt with recent Guardant Reveal results being negative.  Pt educated and verbalized understanding.

## 2024-04-06 ENCOUNTER — Encounter: Payer: Self-pay | Admitting: Hematology and Oncology

## 2024-04-10 ENCOUNTER — Ambulatory Visit (HOSPITAL_BASED_OUTPATIENT_CLINIC_OR_DEPARTMENT_OTHER): Admitting: Obstetrics & Gynecology

## 2024-04-24 ENCOUNTER — Encounter: Payer: Self-pay | Admitting: Adult Health

## 2024-04-24 ENCOUNTER — Inpatient Hospital Stay: Attending: Hematology and Oncology | Admitting: Adult Health

## 2024-04-24 VITALS — BP 135/64 | HR 68 | Temp 98.4°F | Resp 18 | Ht 61.0 in | Wt 136.9 lb

## 2024-04-24 DIAGNOSIS — Z1721 Progesterone receptor positive status: Secondary | ICD-10-CM | POA: Insufficient documentation

## 2024-04-24 DIAGNOSIS — N6310 Unspecified lump in the right breast, unspecified quadrant: Secondary | ICD-10-CM | POA: Diagnosis not present

## 2024-04-24 DIAGNOSIS — Z87891 Personal history of nicotine dependence: Secondary | ICD-10-CM | POA: Insufficient documentation

## 2024-04-24 DIAGNOSIS — E039 Hypothyroidism, unspecified: Secondary | ICD-10-CM | POA: Diagnosis not present

## 2024-04-24 DIAGNOSIS — Z79890 Hormone replacement therapy (postmenopausal): Secondary | ICD-10-CM | POA: Insufficient documentation

## 2024-04-24 DIAGNOSIS — M85851 Other specified disorders of bone density and structure, right thigh: Secondary | ICD-10-CM | POA: Insufficient documentation

## 2024-04-24 DIAGNOSIS — C50411 Malignant neoplasm of upper-outer quadrant of right female breast: Secondary | ICD-10-CM | POA: Diagnosis present

## 2024-04-24 DIAGNOSIS — Z79811 Long term (current) use of aromatase inhibitors: Secondary | ICD-10-CM | POA: Insufficient documentation

## 2024-04-24 DIAGNOSIS — Z923 Personal history of irradiation: Secondary | ICD-10-CM | POA: Diagnosis not present

## 2024-04-24 DIAGNOSIS — Z1732 Human epidermal growth factor receptor 2 negative status: Secondary | ICD-10-CM | POA: Insufficient documentation

## 2024-04-24 DIAGNOSIS — E785 Hyperlipidemia, unspecified: Secondary | ICD-10-CM | POA: Diagnosis not present

## 2024-04-24 DIAGNOSIS — Z803 Family history of malignant neoplasm of breast: Secondary | ICD-10-CM | POA: Diagnosis not present

## 2024-04-24 DIAGNOSIS — Z800 Family history of malignant neoplasm of digestive organs: Secondary | ICD-10-CM | POA: Diagnosis not present

## 2024-04-24 DIAGNOSIS — Z79899 Other long term (current) drug therapy: Secondary | ICD-10-CM | POA: Diagnosis not present

## 2024-04-24 DIAGNOSIS — M8588 Other specified disorders of bone density and structure, other site: Secondary | ICD-10-CM | POA: Diagnosis not present

## 2024-04-24 DIAGNOSIS — Z8249 Family history of ischemic heart disease and other diseases of the circulatory system: Secondary | ICD-10-CM | POA: Diagnosis not present

## 2024-04-24 DIAGNOSIS — Z170 Estrogen receptor positive status [ER+]: Secondary | ICD-10-CM | POA: Insufficient documentation

## 2024-04-24 NOTE — Progress Notes (Unsigned)
 SURVIVORSHIP VISIT:  BRIEF ONCOLOGIC HISTORY:  Oncology History  Malignant neoplasm of upper-outer quadrant of right breast in female, estrogen receptor positive (HCC)  09/10/2023 Initial Diagnosis   Screening mammogram detected right breast mass UOQ: 0.8 cm at 10:00, axilla negative, biopsy: Grade 2 IDC ER 100%, PR 80%, Ki67 10%, HER2 positive by FISH ratio 3.26 copy #7    09/21/2023 Cancer Staging   Staging form: Breast, AJCC 8th Edition - Clinical stage from 09/21/2023: Stage IA (cT1b, cN0, cM0, G2, ER+, PR+, HER2+) - Signed by Lanell Donald Stagger, PA-C on 09/21/2023 Stage prefix: Initial diagnosis Method of lymph node assessment: Clinical Histologic grading system: 3 grade system   10/13/2023 Surgery   Right lumpectomy: Invasive poorly differentiated adenocarcinoma grade 3, DCIS intermediate grade, 1.7 cm, margins negative, negative for angiolymphatic invasion, 0/5 lymph nodes negative, ER 100%, PR 80%, HER2 positive, Ki-67 10%   10/20/2023 Genetic Testing   Negative [facility] Panel.  The report date is 10/20/2023.   The [facility] Panel includes sequencing and rearrangement analysis for the following 39 genes: APC, ATM, BAP1, BARD1, BMPR1A, BRCA1, BRCA2, BRIP1, CDH1, CDKN2A, CHEK2, FH, FLCN, MET, MLH1, MSH2, MSH6, MUTYH, NF1, NTHL1, PALB2, PMS2, PTEN, RAD51C, RAD51D, SMAD4, STK11, TP53, TSC1, TSC2, and VHL (sequencing and deletion/duplication); AXIN2, HOXB13, MBD4, MSH3, POLD1 and POLE (sequencing only); EPCAM and GREM1 (deletion/duplication only).  Of note, RNA analysis was unable to be completed due to insufficient sample quality.    11/14/2023 Cancer Staging   Staging form: Breast, AJCC 8th Edition - Pathologic stage from 11/14/2023: Stage IA (pT1c, pN0(sn), cM0, G3, ER+, PR+, HER2-, Oncotype DX score: 21) - Signed by Lanell Donald Stagger, PA-C on 11/14/2023 Stage prefix: Initial diagnosis Method of lymph node assessment: Sentinel lymph node biopsy Multigene prognostic  tests performed: Oncotype DX Recurrence score range: Greater than or equal to 11 Histologic grading system: 3 grade system   12/16/2023 - 01/12/2024 Radiation Therapy   Plan Name: Breast_R Site: Breast, Right Technique: 3D Mode: Photon Dose Per Fraction: 2.66 Gy Prescribed Dose (Delivered / Prescribed): 42.56 Gy / 42.56 Gy Prescribed Fxs (Delivered / Prescribed): 16 / 16   Plan Name: Breast_Rt_Bst Site: Breast, Right Technique: 3D Mode: Photon Dose Per Fraction: 2 Gy Prescribed Dose (Delivered / Prescribed): 8 Gy / 8 Gy Prescribed Fxs (Delivered / Prescribed): 4 / 4     03/2024 -  Anti-estrogen oral therapy   Anastrozole  x 7 years     INTERVAL HISTORY:  Discussed the use of AI scribe software for clinical note transcription with the patient, who gave verbal consent to proceed.  History of Present Illness Christie Molina is a 63 year old female with stage 1A breast cancer who presents for follow-up care.  She has stage 1A breast cancer in the right breast, estrogen and progesterone receptor positive, HER2 negative. She underwent lumpectomy with removal of five lymph nodes, all negative for cancer, followed by radiation therapy. She is currently on anastrozole  for anti-estrogen therapy.  She experiences intense hot flashes as a side effect of anastrozole  and takes the medication every other day. She is concerned about potential bone loss and aging appearance due to the medication.  Post-surgery, she has tightness under the right arm, affecting her ability to sleep on her right side. She has skin discoloration and breast swelling following radiation therapy.  She has undergone genetic testing with negative results. She has regular follow-up care, including mammograms and bone density testing, and participates in Guardant Reveal blood testing (negative  this past June) and Sozo testing for lymphedema monitoring.    REVIEW OF SYSTEMS:  Review of Systems  Constitutional:  Negative  for appetite change, chills, fatigue, fever and unexpected weight change.  HENT:   Negative for hearing loss, lump/mass and trouble swallowing.   Eyes:  Negative for eye problems and icterus.  Respiratory:  Negative for chest tightness, cough and shortness of breath.   Cardiovascular:  Negative for chest pain, leg swelling and palpitations.  Gastrointestinal:  Negative for abdominal distention, abdominal pain, constipation, diarrhea, nausea and vomiting.  Endocrine: Positive for hot flashes.  Genitourinary:  Negative for difficulty urinating.   Musculoskeletal:  Negative for arthralgias.  Skin:  Negative for itching and rash.  Neurological:  Negative for dizziness, extremity weakness, headaches and numbness.  Hematological:  Negative for adenopathy. Does not bruise/bleed easily.  Psychiatric/Behavioral:  Negative for depression. The patient is not nervous/anxious.    Breast: Denies any new nodularity, masses, tenderness, nipple changes, or nipple discharge.       PAST MEDICAL/SURGICAL HISTORY:  Past Medical History:  Diagnosis Date   Breast cancer (HCC)    right breast IDC   GERD (gastroesophageal reflux disease)    Hyperlipidemia    Hypothyroidism    Thyroid disease    Vitamin D  deficiency    Past Surgical History:  Procedure Laterality Date   BREAST LUMPECTOMY WITH RADIOACTIVE SEED AND SENTINEL LYMPH NODE BIOPSY Right 10/13/2023   Procedure: RIGHT BREAST LUMPECTOMY WITH RADIOACTIVE SEED AND SENTINEL LYMPH NODE BIOPSY;  Surgeon: Curvin Deward MOULD, MD;  Location: [facility];  Service: General;  Laterality: Right;   COLONOSCOPY WITH PROPOFOL  N/A 07/24/2014   Procedure: COLONOSCOPY WITH PROPOFOL ;  Surgeon: Gladis MARLA Louder, MD;  Location: [facility];  Service: Endoscopy;  Laterality: N/A;   UPPER GASTROINTESTINAL ENDOSCOPY  08/2018     ALLERGIES:  No Known Allergies   CURRENT MEDICATIONS:  Outpatient Encounter Medications as of 04/24/2024  Medication Sig    anastrozole  (ARIMIDEX ) 1 MG tablet Take 1 tablet (1 mg total) by mouth daily.   cetirizine (ZYRTEC) 10 MG tablet Take 10 mg by mouth daily.   famotidine (PEPCID) 20 MG tablet Take 20 mg by mouth daily.   levothyroxine  (SYNTHROID ) 75 MCG tablet TAKE 1 TABLET(75 MCG) BY MOUTH DAILY   meloxicam  (MOBIC ) 7.5 MG tablet TAKE 1 TO 2 TABLETS BY MOUTH DAILY WITH A MEAL.   pantoprazole  (PROTONIX ) 20 MG tablet Take 20 mg by mouth daily. (Patient taking differently: Take 20 mg by mouth daily. TAKES SOMETIMES)   simvastatin  (ZOCOR ) 20 MG tablet TAKE 1 TABLET(20 MG) BY MOUTH EVERY EVENING   No facility-administered encounter medications on file as of 04/24/2024.     ONCOLOGIC FAMILY HISTORY:  Family History  Problem Relation Age of Onset   Heart disease Father    Pancreatic cancer Maternal Aunt 54   Breast cancer Paternal Aunt 62 - 23   Breast cancer Cousin        maternal first cousin     SOCIAL HISTORY:  Social History   Socioeconomic History   Marital status: Married    Spouse name: Not on file   Number of children: 2   Years of education: Not on file   Highest education level: Not on file  Occupational History   Not on file  Tobacco Use   Smoking status: Former    Current packs/day: 0.00    Average packs/day: 0.1 packs/day for 4.0 years (0.4 ttl pk-yrs)  Types: Cigarettes    Start date: 05/05/1982    Quit date: 05/05/1986    Years since quitting: 38.0   Smokeless tobacco: Never  Vaping Use   Vaping status: Never Used  Substance and Sexual Activity   Alcohol use: Yes    Alcohol/week: 0.0 - 1.0 standard drinks of alcohol    Comment: social   Drug use: No   Sexual activity: Yes    Partners: Male    Birth control/protection: Post-menopausal  Other Topics Concern   Not on file  Social History Narrative   Not on file   Social Drivers of Health   Financial Resource Strain: Not on file  Food Insecurity: No Food Insecurity (10/11/2023)   Hunger Vital Sign    Worried About  Running Out of Food in the Last Year: Never true    Ran Out of Food in the Last Year: Never true  Transportation Needs: No Transportation Needs (10/11/2023)   PRAPARE - Administrator, Civil Service (Medical): No    Lack of Transportation (Non-Medical): No  Physical Activity: Not on file  Stress: Not on file  Social Connections: Not on file  Intimate Partner Violence: Not At Risk (10/11/2023)   Humiliation, Afraid, Rape, and Kick questionnaire    Fear of Current or Ex-Partner: No    Emotionally Abused: No    Physically Abused: No    Sexually Abused: No     OBSERVATIONS/OBJECTIVE:  BP 135/64 (BP Location: Left Arm, Patient Position: Sitting)   Pulse 68   Temp 98.4 F (36.9 C) (Tympanic)   Resp 18   Ht 5' 1 (1.549 m)   Wt 136 lb 14.4 oz (62.1 kg)   LMP 07/22/2013 (Exact Date)   SpO2 100%   BMI 25.87 kg/m  GENERAL: Patient is a well appearing female in no acute distress HEENT:  Sclerae anicteric.  Oropharynx clear and moist. No ulcerations or evidence of oropharyngeal candidiasis. Neck is supple.  NODES:  No cervical, supraclavicular, or axillary lymphadenopathy palpated.  BREAST EXAM:  Right breast s/p lumpectomy and radiation, no sign of local recurrence, left breast benign LUNGS:  Clear to auscultation bilaterally.  No wheezes or rhonchi. HEART:  Regular rate and rhythm. No murmur appreciated. ABDOMEN:  Soft, nontender.  Positive, normoactive bowel sounds. No organomegaly palpated. MSK:  No focal spinal tenderness to palpation. Full range of motion bilaterally in the upper extremities. EXTREMITIES:  No peripheral edema.   SKIN:  Clear with no obvious rashes or skin changes. No nail dyscrasia. NEURO:  Nonfocal. Well oriented.  Appropriate affect.   LABORATORY DATA:  None for this visit.  DIAGNOSTIC IMAGING:  None for this visit.      ASSESSMENT AND PLAN:  Ms.. Behlke is a pleasant 63 y.o. female with Stage IA right breast invasive ductal carcinoma,  ER+/PR+/HER2-, diagnosed in 09/2023, treated with lumpectomy, adjuvant radiation therapy, and anti-estrogen therapy with Anastrozole  beginning in 03/2024.  She presents to the Survivorship Clinic for our initial meeting and routine follow-up post-completion of treatment for breast cancer.    1. Stage IA right breast cancer:  Ms. Delatorre is continuing to recover from definitive treatment for breast cancer. She will follow-up with her medical oncologist, Dr.  Gudena in 06/2024 with history and physical exam per surveillance protocol.  She will continue her anti-estrogen therapy with Anastrozole  (see #2). She is undergoing guardant reveal testing every 6 months.  Her most recent testing occurred on 03/29/2024.  Her mammogram is due 08/2024; orders placed  today.   Today, a comprehensive survivorship care plan and treatment summary was reviewed with the patient today detailing her breast cancer diagnosis, treatment course, potential late/long-term effects of treatment, appropriate follow-up care with recommendations for the future, and patient education resources.  A copy of this summary, along with a letter will be sent to the patient's primary care provider via mail/fax/In Basket message after today's visit.    2. Anastrozole  Side effects: She will continue anastrozole  every other day for now.  That is how she has been taking it.  If her symptoms worsen I recommended that she stop anastrozole  for 2 weeks and also consider starting a different aromatase inhibitor such as letrozole.  3. Bone health:  Given Ms. Standifer's age/history of breast cancer and her current treatment regimen including anti-estrogen therapy with Anastrozole , she is at risk for bone demineralization.  Her last DEXA scan was 07/2022 and demonstrated osteopenia with a t score of -1.9 in the right femoral neck. Repeat is recommended in 07/2024. She was given education on specific activities to promote bone health.  4. Cancer screening:  Due to  Ms. Cowden's history and her age, she should receive screening for skin cancers, colon cancer, and gynecologic cancers.  The information and recommendations are listed on the patient's comprehensive care plan/treatment summary and were reviewed in detail with the patient.    5. Health maintenance and wellness promotion: Ms. Fullam was encouraged to consume 5-7 servings of fruits and vegetables per day. We reviewed the Nutrition Rainbow handout.  She was also encouraged to engage in moderate to vigorous exercise for 30 minutes per day most days of the week.  She was instructed to limit her alcohol consumption and continue to abstain from tobacco use.     6. Support services/counseling: It is not uncommon for this period of the patient's cancer care trajectory to be one of many emotions and stressors.   She was given information regarding our available services and encouraged to contact me with any questions or for help enrolling in any of our support group/programs.    Follow up instructions:    -Return to cancer center 06/2024 for f/u with Dr. Odean  -Mammogram due in 08/2024 -DEXA 07/2024 -Guardant reveal testing every 6 months -She is welcome to return back to the Survivorship Clinic at any time; no additional follow-up needed at this time.  -Consider referral back to survivorship as a long-term survivor for continued surveillance  The patient was provided an opportunity to ask questions and all were answered. The patient agreed with the plan and demonstrated an understanding of the instructions.   Total encounter time:40 minutes*in face-to-face visit time, chart review, lab review, care coordination, order entry, and documentation of the encounter time.    Morna Kendall, NP 04/25/24 12:35 PM Medical Oncology and Hematology [facility] [address] [place], [place] [address] Tel. [phone]    Fax. [phone]  *Total Encounter Time as defined by the Centers for  Medicare and Medicaid Services includes, in addition to the face-to-face time of a patient visit (documented in the note above) non-face-to-face time: obtaining and reviewing outside history, ordering and reviewing medications, tests or procedures, care coordination (communications with other health care professionals or caregivers) and documentation in the medical record.

## 2024-05-09 ENCOUNTER — Ambulatory Visit (HOSPITAL_BASED_OUTPATIENT_CLINIC_OR_DEPARTMENT_OTHER): Admitting: Obstetrics & Gynecology

## 2024-05-15 ENCOUNTER — Ambulatory Visit: Attending: General Surgery

## 2024-05-15 DIAGNOSIS — Z483 Aftercare following surgery for neoplasm: Secondary | ICD-10-CM | POA: Insufficient documentation

## 2024-05-23 ENCOUNTER — Encounter (HOSPITAL_BASED_OUTPATIENT_CLINIC_OR_DEPARTMENT_OTHER): Payer: Self-pay | Admitting: Obstetrics & Gynecology

## 2024-05-23 ENCOUNTER — Ambulatory Visit (HOSPITAL_BASED_OUTPATIENT_CLINIC_OR_DEPARTMENT_OTHER): Admitting: Obstetrics & Gynecology

## 2024-05-23 VITALS — BP 127/75 | HR 63 | Ht 61.0 in | Wt 136.0 lb

## 2024-05-23 DIAGNOSIS — Z01419 Encounter for gynecological examination (general) (routine) without abnormal findings: Secondary | ICD-10-CM | POA: Diagnosis not present

## 2024-05-23 DIAGNOSIS — Z170 Estrogen receptor positive status [ER+]: Secondary | ICD-10-CM | POA: Diagnosis not present

## 2024-05-23 DIAGNOSIS — C50411 Malignant neoplasm of upper-outer quadrant of right female breast: Secondary | ICD-10-CM

## 2024-05-23 DIAGNOSIS — E039 Hypothyroidism, unspecified: Secondary | ICD-10-CM | POA: Diagnosis not present

## 2024-05-23 NOTE — Progress Notes (Signed)
 ANNUAL EXAM Patient name: Christie Molina MRN [id number]  Date of birth: Dec 04, 1960 Chief Complaint:   Gynecologic Exam  History of Present Illness:   Christie Molina is a 63 y.o. G29P2002 Caucasian female being seen today for a routine annual exam.  Had breast cancer this past year.  She lumpectomy, radiation and now on anastrozole .  She will be on this for 5 years.  She is having some tightness on her right arm.  She did PT prior to radiation.  Discussed considering this again.  Will let me know if she wants a referral.    Denies vaginal bleeding.    Patient's last menstrual period was 07/22/2013 (exact date).  Last pap 01/08/2022. Results were: NEG. H/O abnormal pap: yes Last mammogram: 08/10/2023. Results were: normal. Family h/o breast cancer: yes paternal aunt Last colonoscopy: 2015.  Due this year.  Last one was done with [facility].   DEXA:  07/31/2022 normal.  Scheduled this fall.       10/11/2023    1:28 PM 02/25/2023    2:02 PM 01/28/2023    3:48 PM 12/24/2022   11:35 AM 06/25/2022   11:12 AM  Depression screen PHQ 2/9  Decreased Interest 0 0 0 0 0  Down, Depressed, Hopeless 0 0 0 0 0  PHQ - 2 Score 0 0 0 0 0    Review of Systems:   Pertinent items are noted in HPI Denies any urinary or new bowel changes.  Denies vaginal bleeding.  Denies pelvic pain.   Pertinent History Reviewed:  Reviewed past medical,surgical, social and family history.  Reviewed problem list, medications and allergies. Physical Assessment:   Vitals:   05/23/24 1507  BP: 127/75  Pulse: 63  SpO2: 100%  Weight: 136 lb (61.7 kg)  Height: 5' 1 (1.549 m)  Body mass index is 25.7 kg/m.        Physical Examination:   General appearance - well appearing, and in no distress  Mental status - alert, oriented to person, place, and time  Psych:  She has a normal mood and affect  Skin - warm and dry, normal color, no suspicious lesions noted  Chest - effort normal, all lung fields clear to auscultation  bilaterally  Heart - normal rate and regular rhythm  Neck:  midline trachea, no thyromegaly or nodules  Breasts - Left breast without masses, skin changes, LAD, nipple discharge; right breast with new radiation changes with tissue thickness, no LAD, well healed scar in axilla, no masses.  Abdomen - soft, nontender, nondistended, no masses or organomegaly  Pelvic - VULVA: normal appearing vulva with no masses, tenderness or lesions   VAGINA: normal appearing vagina with normal color and discharge, no lesions   CERVIX: normal appearing cervix without discharge or lesions, no CMT  Thin prep pap is done today  UTERUS: uterus is felt to be normal size, shape, consistency and nontender   ADNEXA: No adnexal masses or tenderness noted.  Rectal - normal rectal, good sphincter tone, no masses felt.  Extremities:  No swelling or varicosities noted  Chaperone present for exam  No results found for this or any previous visit (from the past 24 hours).  Assessment & Plan:  1. Well woman exam with routine gynecological exam (Primary) - Pap smear 01/2022.  Guidelines reviewed.   - Mammogram 08/2023 - Colonoscopy 2015.  Due this year.   - Bone mineral density 07/24/2022 - lab work done with PCP, Dr. Perri - vaccines reviewed/updated  2. Malignant neoplasm of upper-outer quadrant of right breast in female, estrogen receptor positive (HCC) - followed by Dr Odean.  Has follow up in September.   - on Anastrozole  every other day  3. Acquired hypothyroidism - on synthroid    No orders of the defined types were placed in this encounter.   Meds: No orders of the defined types were placed in this encounter.   Follow-up: Return in about 1 year (around 05/23/2025).

## 2024-06-07 NOTE — Progress Notes (Signed)
 Order(s) created erroneously. Erroneous order ID: [id number]  Order canceled by: CHART CORRECTION ANALYST NINE, IDENTITY  Order cancel date/time: 06/07/2024 9:59 AM

## 2024-06-13 ENCOUNTER — Inpatient Hospital Stay: Attending: Hematology and Oncology | Admitting: Hematology and Oncology

## 2024-06-13 VITALS — BP 118/68 | HR 71 | Temp 97.6°F | Resp 18 | Ht 61.0 in | Wt 137.4 lb

## 2024-06-13 DIAGNOSIS — Z79890 Hormone replacement therapy (postmenopausal): Secondary | ICD-10-CM | POA: Insufficient documentation

## 2024-06-13 DIAGNOSIS — Z79899 Other long term (current) drug therapy: Secondary | ICD-10-CM | POA: Insufficient documentation

## 2024-06-13 DIAGNOSIS — Z1731 Human epidermal growth factor receptor 2 positive status: Secondary | ICD-10-CM | POA: Diagnosis not present

## 2024-06-13 DIAGNOSIS — Z170 Estrogen receptor positive status [ER+]: Secondary | ICD-10-CM | POA: Insufficient documentation

## 2024-06-13 DIAGNOSIS — Z1721 Progesterone receptor positive status: Secondary | ICD-10-CM | POA: Diagnosis not present

## 2024-06-13 DIAGNOSIS — Z79811 Long term (current) use of aromatase inhibitors: Secondary | ICD-10-CM | POA: Diagnosis not present

## 2024-06-13 DIAGNOSIS — M79606 Pain in leg, unspecified: Secondary | ICD-10-CM | POA: Insufficient documentation

## 2024-06-13 DIAGNOSIS — C50411 Malignant neoplasm of upper-outer quadrant of right female breast: Secondary | ICD-10-CM | POA: Insufficient documentation

## 2024-06-13 NOTE — Progress Notes (Signed)
 Patient Care Team: Perri Ronal PARAS, MD as PCP - General (Internal Medicine) Cleotilde Ronal RAMAN, MD as Consulting Physician (Gynecology) Curvin Deward MOULD, MD as Consulting Physician (General Surgery) Odean Potts, MD as Consulting Physician (Hematology and Oncology) Dewey Rush, MD as Consulting Physician (Radiation Oncology)  DIAGNOSIS:  Encounter Diagnosis  Name Primary?   Malignant neoplasm of upper-outer quadrant of right breast in female, estrogen receptor positive (HCC) Yes    SUMMARY OF ONCOLOGIC HISTORY: Oncology History  Malignant neoplasm of upper-outer quadrant of right breast in female, estrogen receptor positive (HCC)  09/10/2023 Initial Diagnosis   Screening mammogram detected right breast mass UOQ: 0.8 cm at 10:00, axilla negative, biopsy: Grade 2 IDC ER 100%, PR 80%, Ki67 10%, HER2 positive by FISH ratio 3.26 copy #7    09/21/2023 Cancer Staging   Staging form: Breast, AJCC 8th Edition - Clinical stage from 09/21/2023: Stage IA (cT1b, cN0, cM0, G2, ER+, PR+, HER2+) - Signed by Lanell Donald Stagger, PA-C on 09/21/2023 Stage prefix: Initial diagnosis Method of lymph node assessment: Clinical Histologic grading system: 3 grade system   10/13/2023 Surgery   Right lumpectomy: Invasive poorly differentiated adenocarcinoma grade 3, DCIS intermediate grade, 1.7 cm, margins negative, negative for angiolymphatic invasion, 0/5 lymph nodes negative, ER 100%, PR 80%, HER2 positive, Ki-67 10%   10/20/2023 Genetic Testing   Negative [facility] Cancer[employer] Panel.  The report date is 10/20/2023.   The [employer] Cancer[employer] Panel includes sequencing and rearrangement analysis for the following 39 genes: APC, ATM, BAP1, BARD1, BMPR1A, BRCA1, BRCA2, BRIP1, CDH1, CDKN2A, CHEK2, FH, FLCN, MET, MLH1, MSH2, MSH6, MUTYH, NF1, NTHL1, PALB2, PMS2, PTEN, RAD51C, RAD51D, SMAD4, STK11, TP53, TSC1, TSC2, and VHL (sequencing and deletion/duplication); AXIN2, HOXB13, MBD4, MSH3, POLD1 and POLE (sequencing only); EPCAM  and GREM1 (deletion/duplication only).  Of note, RNA analysis was unable to be completed due to insufficient sample quality.    11/14/2023 Cancer Staging   Staging form: Breast, AJCC 8th Edition - Pathologic stage from 11/14/2023: Stage IA (pT1c, pN0(sn), cM0, G3, ER+, PR+, HER2-, Oncotype DX score: 21) - Signed by Lanell Donald Stagger, PA-C on 11/14/2023 Stage prefix: Initial diagnosis Method of lymph node assessment: Sentinel lymph node biopsy Multigene prognostic tests performed: Oncotype DX Recurrence score range: Greater than or equal to 11 Histologic grading system: 3 grade system   12/16/2023 - 01/12/2024 Radiation Therapy   Plan Name: Breast_R Site: Breast, Right Technique: 3D Mode: Photon Dose Per Fraction: 2.66 Gy Prescribed Dose (Delivered / Prescribed): 42.56 Gy / 42.56 Gy Prescribed Fxs (Delivered / Prescribed): 16 / 16   Plan Name: Breast_Rt_Bst Site: Breast, Right Technique: 3D Mode: Photon Dose Per Fraction: 2 Gy Prescribed Dose (Delivered / Prescribed): 8 Gy / 8 Gy Prescribed Fxs (Delivered / Prescribed): 4 / 4     03/2024 -  Anti-estrogen oral therapy   Anastrozole  x 7 years     CHIEF COMPLIANT: Follow-up on anastrozole  therapy  HISTORY OF PRESENT ILLNESS:   History of Present Illness Christie Molina is a 63 year old female with breast cancer who presents for follow-up regarding anastrozole  therapy.  She takes anastrozole  daily without worsening hot flashes. She does not experience significant joint stiffness or achiness. Leg pain complicates treadmill use, with uncertainty about its cause. Her anastrozole  prescription was recently refilled with 90 tablets and three refills remaining. Her mammogram is scheduled for November at [facility]. She undergoes blood tests every six months, with the last one in June.     ALLERGIES:  has no  known allergies.  MEDICATIONS:  Current Outpatient Medications  Medication Sig Dispense Refill   anastrozole  (ARIMIDEX ) 1 MG tablet  Take 1 tablet (1 mg total) by mouth daily. 90 tablet 3   cetirizine (ZYRTEC) 10 MG tablet Take 10 mg by mouth daily.     famotidine (PEPCID) 20 MG tablet Take 20 mg by mouth daily.     levothyroxine  (SYNTHROID ) 75 MCG tablet TAKE 1 TABLET(75 MCG) BY MOUTH DAILY 90 tablet 3   pantoprazole  (PROTONIX ) 20 MG tablet Take 20 mg by mouth daily.     simvastatin  (ZOCOR ) 20 MG tablet TAKE 1 TABLET(20 MG) BY MOUTH EVERY EVENING 90 tablet 3   No current facility-administered medications for this visit.    PHYSICAL EXAMINATION: ECOG PERFORMANCE STATUS: 1 - Symptomatic but completely ambulatory  Vitals:   06/13/24 1400  BP: 118/68  Pulse: 71  Resp: 18  Temp: 97.6 F (36.4 C)  SpO2: 100%   Filed Weights   06/13/24 1400  Weight: 137 lb 6.4 oz (62.3 kg)      LABORATORY DATA:  I have reviewed the data as listed    Latest Ref Rng & Units 09/22/2023   12:42 PM 06/28/2023    9:22 AM 06/22/2022   11:10 AM  CMP  Glucose 70 - 99 mg/dL 95  91  88   BUN 8 - 23 mg/dL 12  11  11    Creatinine 0.44 - 1.00 mg/dL 9.07  9.12  9.22   Sodium 135 - 145 mmol/L 141  141  141   Potassium 3.5 - 5.1 mmol/L 4.2  4.9  4.6   Chloride 98 - 111 mmol/L 106  104  103   CO2 22 - 32 mmol/L 31  29  30    Calcium 8.9 - 10.3 mg/dL 9.2  9.7  9.8   Total Protein 6.5 - 8.1 g/dL 6.5  7.0  6.9   Total Bilirubin <1.2 mg/dL 0.4  0.7  0.6   Alkaline Phos 38 - 126 U/L 59     AST 15 - 41 U/L 19  20  23    ALT 0 - 44 U/L 17  15  13      Lab Results  Component Value Date   WBC 4.2 09/22/2023   HGB 13.5 09/22/2023   HCT 40.5 09/22/2023   MCV 90.6 09/22/2023   PLT 227 09/22/2023   NEUTROABS 2.3 09/22/2023    ASSESSMENT & PLAN:  Malignant neoplasm of upper-outer quadrant of right breast in female, estrogen receptor positive (HCC) 09/10/2023: Screening mammogram detected right breast mass UOQ: 0.8 cm at 10:00, axilla negative, biopsy: Grade 2 IDC ER 100%, PR 80%, Ki67 10%, HER2 positive by FISH ratio 3.26 copy #7    10/13/2023:  Right lumpectomy: Invasive poorly differentiated adenocarcinoma grade 3, DCIS intermediate grade, 1.7 cm, margins negative, negative for angiolymphatic invasion, 0/5 lymph nodes negative, ER 100%, PR 80%, HER2 positive on biopsy, Ki-67 10% Repeat breast prognostic panel: ER 95%, PR 60%, Ki67 10%, HER2 1+ negative Oncotype DX recurrence score: 21 (7% risk of distant recurrence at 9 years)   Treatment plan: Adjuvant radiation 12/17/2023-01/12/2024 Adjuvant antiestrogen therapy anastrozole  started 03/09/2024 (patient takes it every other day)) ------------------------------------------------------------------------------------------------------------------- Anastrozole  toxicities: Tolerating it extremely well by taking it every other day.  She will remain on this dosage  Breast cancer surveillance: Mammogram has been ordered to be done in November. Guardant reveal: Negative Return to clinic in May 2026 for follow-up and after that we can see her annually.  -------------------------------------  Assessment and Plan Assessment & Plan Malignant neoplasm of upper-outer quadrant of right breast, female On anastrozole  for estrogen receptor-positive breast cancer. No worsening of hot flashes, joint stiffness manageable. Anastrozole  aims to reduce estrogen levels, not eliminate them. Estrogen levels fluctuate, making testing impractical. - Continue anastrozole  as tolerated, consider every other day dosing if needed. - Ensure mammogram in November. - Arrange blood tests every six months.      No orders of the defined types were placed in this encounter.  The patient has a good understanding of the overall plan. she agrees with it. she will call with any problems that may develop before the next visit here. Total time spent: 30 mins including face to face time and time spent for planning, charting and co-ordination of care   Naomi MARLA Chad, MD 06/13/24

## 2024-06-13 NOTE — Assessment & Plan Note (Signed)
 09/10/2023: Screening mammogram detected right breast mass UOQ: 0.8 cm at 10:00, axilla negative, biopsy: Grade 2 IDC ER 100%, PR 80%, Ki67 10%, HER2 positive by FISH ratio 3.26 copy #7    10/13/2023: Right lumpectomy: Invasive poorly differentiated adenocarcinoma grade 3, DCIS intermediate grade, 1.7 cm, margins negative, negative for angiolymphatic invasion, 0/5 lymph nodes negative, ER 100%, PR 80%, HER2 positive on biopsy, Ki-67 10% Repeat breast prognostic panel: ER 95%, PR 60%, Ki67 10%, HER2 1+ negative Oncotype DX recurrence score: 21 (7% risk of distant recurrence at 9 years)   Treatment plan: Adjuvant radiation 12/17/2023-01/12/2024 Adjuvant antiestrogen therapy anastrozole  started 03/09/2024 (patient will start taking every other day and see how she tolerates it and then decide) ------------------------------------------------------------------------------------------------------------------- Anastrozole  toxicities:  Breast cancer surveillance: Mammogram has been ordered. Return to clinic in 1 year for follow-up

## 2024-06-26 ENCOUNTER — Ambulatory Visit: Attending: General Surgery

## 2024-06-26 VITALS — Wt 138.1 lb

## 2024-06-26 DIAGNOSIS — Z483 Aftercare following surgery for neoplasm: Secondary | ICD-10-CM | POA: Insufficient documentation

## 2024-06-26 NOTE — Therapy (Signed)
 OUTPATIENT PHYSICAL THERAPY SOZO SCREENING NOTE   Patient Name: Christie Molina MRN: [id number] DOB:1961-03-01, 63 y.o., female Today's Date: 06/26/2024  PCP: Perri Ronal PARAS, MD REFERRING PROVIDER: Curvin Deward MOULD, MD   PT End of Session - 06/26/24 [phone]     Visit Number 1   # unchanged due to screen only   PT Start Time 0844    PT Stop Time 0851    PT Time Calculation (min) 7 min    Activity Tolerance Patient tolerated treatment well    Behavior During Therapy WFL for tasks assessed/performed          Past Medical History:  Diagnosis Date   Breast cancer (HCC)    right breast IDC   GERD (gastroesophageal reflux disease)    Hyperlipidemia    Hypothyroidism    Thyroid disease    Vitamin D  deficiency    Past Surgical History:  Procedure Laterality Date   BREAST LUMPECTOMY WITH RADIOACTIVE SEED AND SENTINEL LYMPH NODE BIOPSY Right 10/13/2023   Procedure: RIGHT BREAST LUMPECTOMY WITH RADIOACTIVE SEED AND SENTINEL LYMPH NODE BIOPSY;  Surgeon: Curvin Deward MOULD, MD;  Location: [facility];  Service: General;  Laterality: Right;   COLONOSCOPY WITH PROPOFOL  N/A 07/24/2014   Procedure: COLONOSCOPY WITH PROPOFOL ;  Surgeon: Gladis MARLA Louder, MD;  Location: [facility];  Service: Endoscopy;  Laterality: N/A;   UPPER GASTROINTESTINAL ENDOSCOPY  08/2018   Patient Active Problem List   Diagnosis Date Noted   Genetic testing 10/01/2023   Malignant neoplasm of upper-outer quadrant of right breast in female, estrogen receptor positive (HCC) 09/20/2023   Hyperplastic polyps of stomach 06/05/2019   Anxiety state 07/06/2013   Hypothyroidism 06/29/2011   GE reflux 06/29/2011   Hyperlipidemia 06/29/2011    REFERRING DIAG: right breast cancer at risk for lymphedema  THERAPY DIAG:  Aftercare following surgery for neoplasm  PERTINENT HISTORY: Patient was diagnosed on 09/10/2023 with right grade 2 invasive ductal carcinoma breast cancer. It measures 8 mm and is located in the  upper outer quadrant. It is ER/PR positive, HER2 neg with a Ki67 of 10%. 10/13/23- R breast lumpectomy and SLNB (0/5); Completed radiation 01/12/24  PRECAUTIONS: right UE Lymphedema risk, None  SUBJECTIVE: Pt returns for her 3 month L-Dex screen. My bra is getting tighter and I can tell my breast is larger than the other one. Morna Kendall showed me how to do some scar tissue massage but it's not getting smaller. Can I come back?  PAIN:  Are you having pain? No  SOZO SCREENING: Patient was assessed today using the SOZO machine to determine the lymphedema index score. This was compared to her baseline score. It was determined that she is within the recommended range when compared to her baseline and no further action is needed at this time. She will continue SOZO screenings. These are done every 3 months for 2 years post operatively followed by every 6 months for 2 years, and then annually.  Patient reported a change in status to PTA which initiated the PTA consulting with a PT. PT determined it would be appropriate to initiate therapy at this time. PT requested a referral from patient's provider.    L-DEX FLOWSHEETS - 06/26/24 0800       L-DEX LYMPHEDEMA SCREENING   Measurement Type Unilateral    L-DEX MEASUREMENT EXTREMITY Upper Extremity    POSITION  Standing    DOMINANT SIDE Right    At Risk Side Right    BASELINE SCORE (UNILATERAL)  1.1    L-DEX SCORE (UNILATERAL) -3.3    VALUE CHANGE (UNILAT) -4.4         P: Cont every 3 month SOZO and eval for Rt breast edema/lymphedema (?)   Aden Berwyn Caldron, PTA 06/26/2024, 8:53 AM

## 2024-06-29 ENCOUNTER — Other Ambulatory Visit: Payer: Self-pay | Admitting: Hematology and Oncology

## 2024-06-29 ENCOUNTER — Other Ambulatory Visit: Payer: Self-pay

## 2024-06-29 ENCOUNTER — Ambulatory Visit: Attending: Hematology and Oncology | Admitting: Physical Therapy

## 2024-06-29 ENCOUNTER — Encounter: Payer: Self-pay | Admitting: Physical Therapy

## 2024-06-29 DIAGNOSIS — Z170 Estrogen receptor positive status [ER+]: Secondary | ICD-10-CM | POA: Insufficient documentation

## 2024-06-29 DIAGNOSIS — C50411 Malignant neoplasm of upper-outer quadrant of right female breast: Secondary | ICD-10-CM | POA: Insufficient documentation

## 2024-06-29 DIAGNOSIS — Z483 Aftercare following surgery for neoplasm: Secondary | ICD-10-CM | POA: Diagnosis present

## 2024-06-29 DIAGNOSIS — M25611 Stiffness of right shoulder, not elsewhere classified: Secondary | ICD-10-CM | POA: Insufficient documentation

## 2024-06-29 DIAGNOSIS — I890 Lymphedema, not elsewhere classified: Secondary | ICD-10-CM | POA: Insufficient documentation

## 2024-06-29 NOTE — Progress Notes (Signed)
 Patient has lymphedema of the breast send we would like to refer her to physical therapy.

## 2024-06-29 NOTE — Therapy (Signed)
 OUTPATIENT PHYSICAL THERAPY  UPPER EXTREMITY ONCOLOGY EVALUATION  Patient Name: Christie Molina MRN: [id number] DOB:Dec 03, 1960, 63 y.o., female Today's Date: 06/29/2024  END OF SESSION:  PT End of Session - 06/29/24 0845     Visit Number 1    Number of Visits 9    Date for Recertification  07/27/24    PT Start Time 0803    PT Stop Time 0846    PT Time Calculation (min) 43 min    Activity Tolerance Patient tolerated treatment well    Behavior During Therapy [facility] for tasks assessed/performed          Past Medical History:  Diagnosis Date   Breast cancer (HCC)    right breast IDC   GERD (gastroesophageal reflux disease)    Hyperlipidemia    Hypothyroidism    Thyroid disease    Vitamin D  deficiency    Past Surgical History:  Procedure Laterality Date   BREAST LUMPECTOMY WITH RADIOACTIVE SEED AND SENTINEL LYMPH NODE BIOPSY Right 10/13/2023   Procedure: RIGHT BREAST LUMPECTOMY WITH RADIOACTIVE SEED AND SENTINEL LYMPH NODE BIOPSY;  Surgeon: Curvin Deward MOULD, MD;  Location: [facility];  Service: General;  Laterality: Right;   COLONOSCOPY WITH PROPOFOL  N/A 07/24/2014   Procedure: COLONOSCOPY WITH PROPOFOL ;  Surgeon: Gladis MARLA Louder, MD;  Location: [facility];  Service: Endoscopy;  Laterality: N/A;   UPPER GASTROINTESTINAL ENDOSCOPY  08/2018   Patient Active Problem List   Diagnosis Date Noted   Genetic testing 10/01/2023   Malignant neoplasm of upper-outer quadrant of right breast in female, estrogen receptor positive (HCC) 09/20/2023   Hyperplastic polyps of stomach 06/05/2019   Anxiety state 07/06/2013   Hypothyroidism 06/29/2011   GE reflux 06/29/2011   Hyperlipidemia 06/29/2011    PCP: Ronal Hailstone, MD  REFERRING PROVIDER: Dr. Mackey Chad  REFERRING DIAG: [phone] (ICD-10-CM) - Malignant neoplasm of upper-outer quadrant of right breast in female, estrogen receptor positive (HCC)   THERAPY DIAG:  Lymphedema, not elsewhere classified  Malignant  neoplasm of upper-outer quadrant of right breast in female, estrogen receptor positive (HCC)  ONSET DATE: 04/04/2024  Rationale for Evaluation and Treatment: Rehabilitation  SUBJECTIVE:                                                                                                                                                                                           SUBJECTIVE STATEMENT: I just noticed this side is bigger. It is not hard as a rock but it is more firm than the other side. I wore the compression bra for a week or so but it did not seem to help.  PERTINENT HISTORY:  Patient was diagnosed on 09/10/2023 with right grade 2 invasive ductal carcinoma breast cancer. It measures 8 mm and is located in the upper outer quadrant. It is ER/PR positive, HER2 neg with a Ki67 of 10%. 10/13/23- R breast lumpectomy and SLNB (0/5) Completed  radiation.   PAIN:  Are you having pain? No  PRECAUTIONS: Other: at risk of R lymphedema  RED FLAGS: None   WEIGHT BEARING RESTRICTIONS: No  FALLS:  Has patient fallen in last 6 months? No  LIVING ENVIRONMENT: Lives with: lives with their spouse Lives in: House/apartment Has following equipment at home: None  OCCUPATION: full time, Chiropodist  LEISURE: does not exercise  HAND DOMINANCE: right   PRIOR LEVEL OF FUNCTION: Independent  PATIENT GOALS: to reduce the breast swelling   OBJECTIVE: Note: Objective measures were completed at Evaluation unless otherwise noted.  COGNITION: Overall cognitive status: Within functional limits for tasks assessed   PALPATION: Fibrosis present in inferior breast  OBSERVATIONS / OTHER ASSESSMENTS: breast is about 25% larger than L breast with increased pore size noted  POSTURE: forward head, rounded shoulders  UPPER EXTREMITY AROM/PROM: WFL but does still have tightness at end range on the R  UPPER EXTREMITY STRENGTH: 5/5  LYMPHEDEMA ASSESSMENTS:   SURGERY TYPE/DATE: R breast lumpectomy  on 10/13/23  NUMBER OF LYMPH NODES REMOVED: 0/5  CHEMOTHERAPY: did not require  RADIATION:completed  HORMONE TREATMENT: on anastrozole   INFECTIONS: none   LYMPHEDEMA ASSESSMENTS: recently had ldex screen which was normal   BREAST COMPLAINTS SURVEY: BREAST COMPLAINTS QUESTIONNAIRE Pain: 1 Heaviness: 4 Swollen feeling: 10 Tense Skin: 2 Redness: 1 Bra Print: 10 Size of Pores: 3 Hard feeling:  4 Total:     35/80 A Score over 9 indicates lymphedema issues in the breast                                                                                                                             TREATMENT DATE:  06/29/24: In supine: Short neck, 5 diaphragmatic breaths, L axillary nodes and establishment of interaxillary pathway, R inguinal nodes and establishment of axilloinguinal pathway, then R breast moving fluid towards pathways spending extra time in any areas of fibrosis then retracing all steps. Educated pt throughout each step on the anatomy and physiology of the lympathic system. Need for correct skin stretch and why the massage is so light. Educated her in the sequence.  Issued info for pt to get measured for a compression bra     PATIENT EDUCATION:  Education details: anatomy and physiology of the lymphatic system, need for compression bra and self MLD to manage Person educated: Patient Education method: Explanation Education comprehension: verbalized understanding  HOME EXERCISE PROGRAM: Obtain compression bra  ASSESSMENT:  CLINICAL IMPRESSION: Patient is a 63 y.o. female who was seen today for physical therapy evaluation and treatment for R breast lymphedema and end range R shoulder stiffness. Pt reports her breast began swelling in July  of 2025. It is about 25% larger than the L breast. She has increased pore size and fibrosis and always has the imprint of her bra when she removes it. She would benefit from skilled PT services to decrease R breast lymphedema,  instruct pt in independent management and instruct pt in end range R shoulder stretching to decrease tightness.     OBJECTIVE IMPAIRMENTS: decreased knowledge of condition, decreased knowledge of use of DME, increased edema, increased fascial restrictions, and postural dysfunction.   ACTIVITY LIMITATIONS: none  PARTICIPATION LIMITATIONS: none  PERSONAL FACTORS: Time since onset of injury/illness/exacerbation are also affecting patient's functional outcome.   REHAB POTENTIAL: Good  CLINICAL DECISION MAKING: Stable/uncomplicated  EVALUATION COMPLEXITY: Low  GOALS: Goals reviewed with patient? Yes  SHORT TERM GOALS=LONG TERM GOALS Target date: 07/27/24  Pt will be indepndent in self MLD for long term management of lymphedema.  Baseline: Goal status: INITIAL  2.  Pt will obtain a compression bra for managmenet of lymphedema.  Baseline:  Goal status: INITIAL  3.  Pt will report a 50% improvement in feeling of heaviness in R breast to allow improved comfort.  Baseline:  Goal status: INITIAL  4.  Pt will be indepennt in a home exercise program for continued stretching and strengtheing.  Baseline:  Goal status: INITIAL   PLAN:  PT FREQUENCY: 2x/week  PT DURATION: 4 weeks  PLANNED INTERVENTIONS: 97164- PT Re-evaluation, 97110-Therapeutic exercises, 97530- Therapeutic activity, 97112- Neuromuscular re-education, 97535- Self Care, 02859- Manual therapy, [phone]- Orthotic Initial, [phone]- Orthotic/Prosthetic subsequent, Patient/Family education, Balance training, Joint mobilization, Therapeutic exercises, Therapeutic activity, Neuromuscular re-education, Gait training, and Self Care  PLAN FOR NEXT SESSION: instruct pt in self MLD, did she get compression bra?, give end range R shoulder stretching  [employer], PT 06/29/2024, 8:49 AM

## 2024-07-03 ENCOUNTER — Other Ambulatory Visit: Payer: 59

## 2024-07-03 DIAGNOSIS — Z0000 Encounter for general adult medical examination without abnormal findings: Secondary | ICD-10-CM

## 2024-07-03 DIAGNOSIS — E7800 Pure hypercholesterolemia, unspecified: Secondary | ICD-10-CM

## 2024-07-03 DIAGNOSIS — E039 Hypothyroidism, unspecified: Secondary | ICD-10-CM

## 2024-07-03 DIAGNOSIS — K580 Irritable bowel syndrome with diarrhea: Secondary | ICD-10-CM

## 2024-07-03 NOTE — Addendum Note (Signed)
 Addended by: Guila Owensby P on: 07/03/2024 09:13 AM   Modules accepted: Orders

## 2024-07-04 ENCOUNTER — Encounter: Payer: Self-pay | Admitting: Physical Therapy

## 2024-07-04 ENCOUNTER — Ambulatory Visit: Attending: Hematology and Oncology | Admitting: Physical Therapy

## 2024-07-04 ENCOUNTER — Ambulatory Visit: Payer: Self-pay | Admitting: Internal Medicine

## 2024-07-04 DIAGNOSIS — M25611 Stiffness of right shoulder, not elsewhere classified: Secondary | ICD-10-CM | POA: Diagnosis present

## 2024-07-04 DIAGNOSIS — I890 Lymphedema, not elsewhere classified: Secondary | ICD-10-CM | POA: Diagnosis present

## 2024-07-04 DIAGNOSIS — C50411 Malignant neoplasm of upper-outer quadrant of right female breast: Secondary | ICD-10-CM | POA: Insufficient documentation

## 2024-07-04 DIAGNOSIS — Z170 Estrogen receptor positive status [ER+]: Secondary | ICD-10-CM | POA: Diagnosis present

## 2024-07-04 NOTE — Patient Instructions (Signed)
Self manual lymph drainage: Perform this sequence once a day.  Only give enough pressure no your skin to make the skin move.  Diaphragmatic - Supine   Inhale through nose making navel move out toward hands. Exhale through puckered lips, hands follow navel in. Repeat _5__ times. Rest _10__ seconds between repeats.   Copyright  VHI. All rights reserved.  Hug yourself.  Do circles at your neck just above your collarbones.  Repeat this 10 times.  Axilla - One at a Time   Using full weight of flat hand and fingers at center of uninvolved armpit, make _10__ in-place circles.   Copyright  VHI. All rights reserved.  LEG: Inguinal Nodes Stimulation   With small finger side of hand against hip crease on involved side, gently perform circles at the crease. Repeat __10_ times.   Copyright  VHI. All rights reserved.  Axilla to Inguinal Nodes - Sweep   On involved side, sweep (stretch skin) _4__ times from armpit along side of trunk to hip crease.  Now gently stretch skin from the involved side to the uninvolved side across the chest at the shoulder line.  Repeat that 4 times.  Draw an imaginary diagonal line from upper outer breast through the nipple area toward lower inner breast.  Direct fluid upward and inward from this line toward the pathway across your upper chest .  Do this in three rows to treat all of the upper inner breast tissue, and do each row 3-4x.      Direct fluid to treat all of lower outer breast tissue downward and outward toward      pathway that is aimed at the right groin.  Finish by doing the pathways as described above going from your involved armpit to the same side groin and going across your upper chest from the involved shoulder to the uninvolved shoulder.  Repeat the steps above where you do circles in your right groin and left armpit. Copyright  VHI. All rights reserved.   

## 2024-07-04 NOTE — Therapy (Signed)
 OUTPATIENT PHYSICAL THERAPY  UPPER EXTREMITY ONCOLOGY TREATMENT  Patient Name: Christie Molina MRN: [id number] DOB:1961-05-24, 63 y.o., female Today's Date: 07/04/2024  END OF SESSION:  PT End of Session - 07/04/24 1553     Visit Number 2    Number of Visits 9    Date for Recertification  07/27/24    PT Start Time 1502    PT Stop Time 1554    PT Time Calculation (min) 52 min    Activity Tolerance Patient tolerated treatment well    Behavior During Therapy WFL for tasks assessed/performed           Past Medical History:  Diagnosis Date   Breast cancer (HCC)    right breast IDC   GERD (gastroesophageal reflux disease)    Hyperlipidemia    Hypothyroidism    Thyroid disease    Vitamin D  deficiency    Past Surgical History:  Procedure Laterality Date   BREAST LUMPECTOMY WITH RADIOACTIVE SEED AND SENTINEL LYMPH NODE BIOPSY Right 10/13/2023   Procedure: RIGHT BREAST LUMPECTOMY WITH RADIOACTIVE SEED AND SENTINEL LYMPH NODE BIOPSY;  Surgeon: Curvin Deward MOULD, MD;  Location: [facility];  Service: General;  Laterality: Right;   COLONOSCOPY WITH PROPOFOL  N/A 07/24/2014   Procedure: COLONOSCOPY WITH PROPOFOL ;  Surgeon: Gladis MARLA Louder, MD;  Location: [facility];  Service: Endoscopy;  Laterality: N/A;   UPPER GASTROINTESTINAL ENDOSCOPY  08/2018   Patient Active Problem List   Diagnosis Date Noted   Genetic testing 10/01/2023   Malignant neoplasm of upper-outer quadrant of right breast in female, estrogen receptor positive (HCC) 09/20/2023   Hyperplastic polyps of stomach 06/05/2019   Anxiety state 07/06/2013   Hypothyroidism 06/29/2011   GE reflux 06/29/2011   Hyperlipidemia 06/29/2011    PCP: Ronal Hailstone, MD  REFERRING PROVIDER: Dr. Mackey Chad  REFERRING DIAG: [phone] (ICD-10-CM) - Malignant neoplasm of upper-outer quadrant of right breast in female, estrogen receptor positive (HCC)   THERAPY DIAG:  Lymphedema, not elsewhere classified  Stiffness  of right shoulder, not elsewhere classified  Malignant neoplasm of upper-outer quadrant of right breast in female, estrogen receptor positive (HCC)  ONSET DATE: 04/04/2024  Rationale for Evaluation and Treatment: Rehabilitation  SUBJECTIVE:                                                                                                                                                                                           SUBJECTIVE STATEMENT: I have an appointment to be fitted for a compression bra on the 14th.   PERTINENT HISTORY:  Patient was diagnosed on 09/10/2023 with right grade 2 invasive ductal carcinoma breast  cancer. It measures 8 mm and is located in the upper outer quadrant. It is ER/PR positive, HER2 neg with a Ki67 of 10%. 10/13/23- R breast lumpectomy and SLNB (0/5) Completed  radiation.   PAIN:  Are you having pain? No  PRECAUTIONS: Other: at risk of R lymphedema  RED FLAGS: None   WEIGHT BEARING RESTRICTIONS: No  FALLS:  Has patient fallen in last 6 months? No  LIVING ENVIRONMENT: Lives with: lives with their spouse Lives in: House/apartment Has following equipment at home: None  OCCUPATION: full time, Chiropodist  LEISURE: does not exercise  HAND DOMINANCE: right   PRIOR LEVEL OF FUNCTION: Independent  PATIENT GOALS: to reduce the breast swelling   OBJECTIVE: Note: Objective measures were completed at Evaluation unless otherwise noted.  COGNITION: Overall cognitive status: Within functional limits for tasks assessed   PALPATION: Fibrosis present in inferior breast  OBSERVATIONS / OTHER ASSESSMENTS: breast is about 25% larger than L breast with increased pore size noted  POSTURE: forward head, rounded shoulders  UPPER EXTREMITY AROM/PROM: WFL but does still have tightness at end range on the R  UPPER EXTREMITY STRENGTH: 5/5  LYMPHEDEMA ASSESSMENTS:   SURGERY TYPE/DATE: R breast lumpectomy on 10/13/23  NUMBER OF LYMPH NODES REMOVED:  0/5  CHEMOTHERAPY: did not require  RADIATION:completed  HORMONE TREATMENT: on anastrozole   INFECTIONS: none   LYMPHEDEMA ASSESSMENTS: recently had ldex screen which was normal   BREAST COMPLAINTS SURVEY: BREAST COMPLAINTS QUESTIONNAIRE Pain: 1 Heaviness: 4 Swollen feeling: 10 Tense Skin: 2 Redness: 1 Bra Print: 10 Size of Pores: 3 Hard feeling:  4 Total:     35/80 A Score over 9 indicates lymphedema issues in the breast                                                                                                                             TREATMENT DATE:  07/04/24: In supine: Short neck, 5 diaphragmatic breaths, L axillary nodes and establishment of interaxillary pathway, R inguinal nodes and establishment of axilloinguinal pathway, then R breast moving fluid towards pathways spending extra time in any areas of fibrosis then retracing all steps. Instructed pt throughout and had her return demonstrate each step while therapist provided appropriate v/c and t/c. Issued handout for pt to begin to practice self MLD at home.    06/29/24: In supine: Short neck, 5 diaphragmatic breaths, L axillary nodes and establishment of interaxillary pathway, R inguinal nodes and establishment of axilloinguinal pathway, then R breast moving fluid towards pathways spending extra time in any areas of fibrosis then retracing all steps. Educated pt throughout each step on the anatomy and physiology of the lympathic system. Need for correct skin stretch and why the massage is so light. Educated her in the sequence.  Issued info for pt to get measured for a compression bra     PATIENT EDUCATION:  Education details: anatomy and physiology of the lymphatic system, need for compression bra and self  MLD to manage Person educated: Patient Education method: Explanation Education comprehension: verbalized understanding  HOME EXERCISE PROGRAM: Obtain compression bra  ASSESSMENT:  CLINICAL  IMPRESSION: Instructed pt in self MLD today and had her return demo each step while therapist provided v/c and t/c. Issued handout for pt to begin practicing at home.    OBJECTIVE IMPAIRMENTS: decreased knowledge of condition, decreased knowledge of use of DME, increased edema, increased fascial restrictions, and postural dysfunction.   ACTIVITY LIMITATIONS: none  PARTICIPATION LIMITATIONS: none  PERSONAL FACTORS: Time since onset of injury/illness/exacerbation are also affecting patient's functional outcome.   REHAB POTENTIAL: Good  CLINICAL DECISION MAKING: Stable/uncomplicated  EVALUATION COMPLEXITY: Low  GOALS: Goals reviewed with patient? Yes  SHORT TERM GOALS=LONG TERM GOALS Target date: 07/27/24  Pt will be indepndent in self MLD for long term management of lymphedema.  Baseline: Goal status: INITIAL  2.  Pt will obtain a compression bra for managmenet of lymphedema.  Baseline:  Goal status: INITIAL  3.  Pt will report a 50% improvement in feeling of heaviness in R breast to allow improved comfort.  Baseline:  Goal status: INITIAL  4.  Pt will be indepennt in a home exercise program for continued stretching and strengtheing.  Baseline:  Goal status: INITIAL   PLAN:  PT FREQUENCY: 2x/week  PT DURATION: 4 weeks  PLANNED INTERVENTIONS: 97164- PT Re-evaluation, 97110-Therapeutic exercises, 97530- Therapeutic activity, 97112- Neuromuscular re-education, 97535- Self Care, [id number]- Manual therapy, [phone]- Orthotic Initial, [phone]- Orthotic/Prosthetic subsequent, Patient/Family education, Balance training, Joint mobilization, Therapeutic exercises, Therapeutic activity, Neuromuscular re-education, Gait training, and Self Care  PLAN FOR NEXT SESSION: instruct pt in self MLD, did she get compression bra?, give end range R shoulder stretching  [employer], PT 07/04/2024, 3:56 PM

## 2024-07-06 ENCOUNTER — Encounter: Payer: Self-pay | Admitting: Internal Medicine

## 2024-07-06 ENCOUNTER — Ambulatory Visit: Payer: 59 | Admitting: Internal Medicine

## 2024-07-06 VITALS — BP 100/70 | HR 60 | Ht 61.0 in | Wt 137.0 lb

## 2024-07-06 DIAGNOSIS — Z818 Family history of other mental and behavioral disorders: Secondary | ICD-10-CM | POA: Diagnosis not present

## 2024-07-06 DIAGNOSIS — K317 Polyp of stomach and duodenum: Secondary | ICD-10-CM

## 2024-07-06 DIAGNOSIS — Z0000 Encounter for general adult medical examination without abnormal findings: Secondary | ICD-10-CM

## 2024-07-06 DIAGNOSIS — K219 Gastro-esophageal reflux disease without esophagitis: Secondary | ICD-10-CM

## 2024-07-06 DIAGNOSIS — C50911 Malignant neoplasm of unspecified site of right female breast: Secondary | ICD-10-CM

## 2024-07-06 DIAGNOSIS — E039 Hypothyroidism, unspecified: Secondary | ICD-10-CM | POA: Diagnosis not present

## 2024-07-06 DIAGNOSIS — Z23 Encounter for immunization: Secondary | ICD-10-CM

## 2024-07-06 DIAGNOSIS — Z8659 Personal history of other mental and behavioral disorders: Secondary | ICD-10-CM | POA: Diagnosis not present

## 2024-07-06 DIAGNOSIS — E7800 Pure hypercholesterolemia, unspecified: Secondary | ICD-10-CM

## 2024-07-06 DIAGNOSIS — R7989 Other specified abnormal findings of blood chemistry: Secondary | ICD-10-CM

## 2024-07-06 DIAGNOSIS — Z8249 Family history of ischemic heart disease and other diseases of the circulatory system: Secondary | ICD-10-CM

## 2024-07-06 LAB — POCT URINALYSIS DIP (CLINITEK)
Bilirubin, UA: NEGATIVE
Blood, UA: NEGATIVE
Glucose, UA: NEGATIVE mg/dL
Ketones, POC UA: NEGATIVE mg/dL
Leukocytes, UA: NEGATIVE
Nitrite, UA: NEGATIVE
POC PROTEIN,UA: NEGATIVE
Spec Grav, UA: 1.01 (ref 1.010–1.025)
Urobilinogen, UA: 0.2 U/dL
pH, UA: 6.5 (ref 5.0–8.0)
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
## 2024-07-07 LAB — COMPREHENSIVE METABOLIC PANEL WITH GFR
AG Ratio: 1.9 (calc) (ref 1.0–2.5)
ALT: 14 U/L (ref 6–29)
AST: 18 U/L (ref 10–35)
Albumin: 4.3 g/dL (ref 3.6–5.1)
Alkaline phosphatase (APISO): 60 U/L (ref 37–153)
BUN: 9 mg/dL (ref 7–25)
CO2: 29 mmol/L (ref 20–32)
Calcium: 9.5 mg/dL (ref 8.6–10.4)
Chloride: 102 mmol/L (ref 98–110)
Creat: 0.79 mg/dL (ref 0.50–1.05)
Globulin: 2.3 g/dL (ref 1.9–3.7)
Glucose, Bld: 99 mg/dL (ref 65–99)
Potassium: 4.5 mmol/L (ref 3.5–5.3)
Sodium: 139 mmol/L (ref 135–146)
Total Bilirubin: 0.6 mg/dL (ref 0.2–1.2)
Total Protein: 6.6 g/dL (ref 6.1–8.1)
eGFR: 83 mL/min/1.73m2 (ref >=60)

## 2024-07-07 LAB — CBC WITH DIFFERENTIAL/PLATELET
Absolute Lymphocytes: 947 {cells}/uL (ref 850–3900)
Absolute Monocytes: 363 {cells}/uL (ref 200–950)
Basophils Absolute: 50 {cells}/uL (ref 0–200)
Basophils Relative: 1.5 %
Eosinophils Absolute: 79 {cells}/uL (ref 15–500)
Eosinophils Relative: 2.4 %
HCT: 40.6 % (ref 35.0–45.0)
Hemoglobin: 13.4 g/dL (ref 11.7–15.5)
MCH: 30.9 pg (ref 27.0–33.0)
MCHC: 33 g/dL (ref 32.0–36.0)
MCV: 93.8 fL (ref 80.0–100.0)
MPV: 10.6 fL (ref 7.5–12.5)
Monocytes Relative: 11 %
Neutro Abs: 1861 {cells}/uL (ref 1500–7800)
Neutrophils Relative %: 56.4 %
Platelets: 202 Thousand/uL (ref 140–400)
RBC: 4.33 Million/uL (ref 3.80–5.10)
RDW: 12.2 % (ref 11.0–15.0)
Total Lymphocyte: 28.7 %
WBC: 3.3 Thousand/uL — ABNORMAL LOW (ref 3.8–10.8)

## 2024-07-07 LAB — LIPID PANEL
Cholesterol: 217 mg/dL — ABNORMAL HIGH (ref <200)
HDL: 95 mg/dL (ref >=50)
LDL Cholesterol (Calc): 106 mg/dL — ABNORMAL HIGH
Non-HDL Cholesterol (Calc): 122 mg/dL (ref <130)
Total CHOL/HDL Ratio: 2.3 (calc) (ref <5.0)
Triglycerides: 70 mg/dL (ref <150)

## 2024-07-07 LAB — ESTROGENS, TOTAL: Estrogen: <25 pg/mL

## 2024-07-07 LAB — TSH: TSH: 0.49 m[IU]/L (ref 0.40–4.50)

## 2024-07-11 ENCOUNTER — Ambulatory Visit: Attending: Hematology and Oncology | Admitting: Physical Therapy

## 2024-07-11 ENCOUNTER — Encounter: Payer: Self-pay | Admitting: Physical Therapy

## 2024-07-11 DIAGNOSIS — Z170 Estrogen receptor positive status [ER+]: Secondary | ICD-10-CM | POA: Diagnosis present

## 2024-07-11 DIAGNOSIS — M25611 Stiffness of right shoulder, not elsewhere classified: Secondary | ICD-10-CM | POA: Diagnosis present

## 2024-07-11 DIAGNOSIS — C50411 Malignant neoplasm of upper-outer quadrant of right female breast: Secondary | ICD-10-CM | POA: Diagnosis present

## 2024-07-11 DIAGNOSIS — I890 Lymphedema, not elsewhere classified: Secondary | ICD-10-CM | POA: Insufficient documentation

## 2024-07-11 NOTE — Therapy (Signed)
 OUTPATIENT PHYSICAL THERAPY  UPPER EXTREMITY ONCOLOGY TREATMENT  Patient Name: Christie Molina MRN: [id number] DOB:19-Dec-1960, 63 y.o., female Today's Date: 07/11/2024  END OF SESSION:  PT End of Session - 07/11/24 1604     Visit Number 3    Number of Visits 9    Date for Recertification  07/27/24    PT Start Time 1603    PT Stop Time 1652    PT Time Calculation (min) 49 min    Activity Tolerance Patient tolerated treatment well    Behavior During Therapy WFL for tasks assessed/performed           Past Medical History:  Diagnosis Date   Breast cancer (HCC)    right breast IDC   GERD (gastroesophageal reflux disease)    Hyperlipidemia    Hypothyroidism    Thyroid disease    Vitamin D  deficiency    Past Surgical History:  Procedure Laterality Date   BREAST LUMPECTOMY WITH RADIOACTIVE SEED AND SENTINEL LYMPH NODE BIOPSY Right 10/13/2023   Procedure: RIGHT BREAST LUMPECTOMY WITH RADIOACTIVE SEED AND SENTINEL LYMPH NODE BIOPSY;  Surgeon: Curvin Deward MOULD, MD;  Location: [facility];  Service: General;  Laterality: Right;   COLONOSCOPY WITH PROPOFOL  N/A 07/24/2014   Procedure: COLONOSCOPY WITH PROPOFOL ;  Surgeon: Gladis MARLA Louder, MD;  Location: [facility];  Service: Endoscopy;  Laterality: N/A;   UPPER GASTROINTESTINAL ENDOSCOPY  08/2018   Patient Active Problem List   Diagnosis Date Noted   Genetic testing 10/01/2023   Malignant neoplasm of upper-outer quadrant of right breast in female, estrogen receptor positive (HCC) 09/20/2023   Hyperplastic polyps of stomach 06/05/2019   Anxiety state 07/06/2013   Hypothyroidism 06/29/2011   GE reflux 06/29/2011   Hyperlipidemia 06/29/2011    PCP: Ronal Hailstone, MD  REFERRING PROVIDER: Dr. Mackey Chad  REFERRING DIAG: [phone] (ICD-10-CM) - Malignant neoplasm of upper-outer quadrant of right breast in female, estrogen receptor positive (HCC)   THERAPY DIAG:  Lymphedema, not elsewhere classified  Stiffness  of right shoulder, not elsewhere classified  Malignant neoplasm of upper-outer quadrant of right breast in female, estrogen receptor positive (HCC)  ONSET DATE: 04/04/2024  Rationale for Evaluation and Treatment: Rehabilitation  SUBJECTIVE:                                                                                                                                                                                           SUBJECTIVE STATEMENT: I have done the massage once since I was here last. I don't think it will do any good until I get the compression bra.   PERTINENT HISTORY:  Patient was  diagnosed on 09/10/2023 with right grade 2 invasive ductal carcinoma breast cancer. It measures 8 mm and is located in the upper outer quadrant. It is ER/PR positive, HER2 neg with a Ki67 of 10%. 10/13/23- R breast lumpectomy and SLNB (0/5) Completed  radiation.   PAIN:  Are you having pain? No  PRECAUTIONS: Other: at risk of R lymphedema  RED FLAGS: None   WEIGHT BEARING RESTRICTIONS: No  FALLS:  Has patient fallen in last 6 months? No  LIVING ENVIRONMENT: Lives with: lives with their spouse Lives in: House/apartment Has following equipment at home: None  OCCUPATION: full time, Chiropodist  LEISURE: does not exercise  HAND DOMINANCE: right   PRIOR LEVEL OF FUNCTION: Independent  PATIENT GOALS: to reduce the breast swelling   OBJECTIVE: Note: Objective measures were completed at Evaluation unless otherwise noted.  COGNITION: Overall cognitive status: Within functional limits for tasks assessed   PALPATION: Fibrosis present in inferior breast  OBSERVATIONS / OTHER ASSESSMENTS: breast is about 25% larger than L breast with increased pore size noted  POSTURE: forward head, rounded shoulders  UPPER EXTREMITY AROM/PROM: WFL but does still have tightness at end range on the R  UPPER EXTREMITY STRENGTH: 5/5  LYMPHEDEMA ASSESSMENTS:   SURGERY TYPE/DATE: R breast  lumpectomy on 10/13/23  NUMBER OF LYMPH NODES REMOVED: 0/5  CHEMOTHERAPY: did not require  RADIATION:completed  HORMONE TREATMENT: on anastrozole   INFECTIONS: none   LYMPHEDEMA ASSESSMENTS: recently had ldex screen which was normal   BREAST COMPLAINTS SURVEY: BREAST COMPLAINTS QUESTIONNAIRE Pain: 1 Heaviness: 4 Swollen feeling: 10 Tense Skin: 2 Redness: 1 Bra Print: 10 Size of Pores: 3 Hard feeling:  4 Total:     35/80 A Score over 9 indicates lymphedema issues in the breast                                                                                                                             TREATMENT DATE:  07/11/24: In supine: Short neck, 5 diaphragmatic breaths, L axillary nodes and establishment of interaxillary pathway, R inguinal nodes and establishment of axilloinguinal pathway, then R breast moving fluid towards pathways spending extra time in any areas of fibrosis then retracing all steps. Instructed pt throughout and had her return demonstrate each step while therapist provided appropriate v/c and t/c. Pt requiring less cueing today compared with last session.   07/04/24: In supine: Short neck, 5 diaphragmatic breaths, L axillary nodes and establishment of interaxillary pathway, R inguinal nodes and establishment of axilloinguinal pathway, then R breast moving fluid towards pathways spending extra time in any areas of fibrosis then retracing all steps. Instructed pt throughout and had her return demonstrate each step while therapist provided appropriate v/c and t/c. Issued handout for pt to begin to practice self MLD at home.    06/29/24: In supine: Short neck, 5 diaphragmatic breaths, L axillary nodes and establishment of interaxillary pathway, R inguinal nodes and establishment of axilloinguinal pathway, then R  breast moving fluid towards pathways spending extra time in any areas of fibrosis then retracing all steps. Educated pt throughout each step on the anatomy and  physiology of the lympathic system. Need for correct skin stretch and why the massage is so light. Educated her in the sequence.  Issued info for pt to get measured for a compression bra     PATIENT EDUCATION:  Education details: anatomy and physiology of the lymphatic system, need for compression bra and self MLD to manage Person educated: Patient Education method: Explanation Education comprehension: verbalized understanding  HOME EXERCISE PROGRAM: Obtain compression bra  ASSESSMENT:  CLINICAL IMPRESSION: Continued instructing pt in self MLD and having her return demo each step. the patient requiring less cueing today. Encouraged pt to practice self MLD daily for about 20 min. She should be measured for a compression bra next week which will also help manage lymphedema.    OBJECTIVE IMPAIRMENTS: decreased knowledge of condition, decreased knowledge of use of DME, increased edema, increased fascial restrictions, and postural dysfunction.   ACTIVITY LIMITATIONS: none  PARTICIPATION LIMITATIONS: none  PERSONAL FACTORS: Time since onset of injury/illness/exacerbation are also affecting patient's functional outcome.   REHAB POTENTIAL: Good  CLINICAL DECISION MAKING: Stable/uncomplicated  EVALUATION COMPLEXITY: Low  GOALS: Goals reviewed with patient? Yes  SHORT TERM GOALS=LONG TERM GOALS Target date: 07/27/24  Pt will be indepndent in self MLD for long term management of lymphedema.  Baseline: Goal status: INITIAL  2.  Pt will obtain a compression bra for managmenet of lymphedema.  Baseline:  Goal status: INITIAL  3.  Pt will report a 50% improvement in feeling of heaviness in R breast to allow improved comfort.  Baseline:  Goal status: INITIAL  4.  Pt will be indepennt in a home exercise program for continued stretching and strengtheing.  Baseline:  Goal status: INITIAL   PLAN:  PT FREQUENCY: 2x/week  PT DURATION: 4 weeks  PLANNED INTERVENTIONS: 97164- PT  Re-evaluation, 97110-Therapeutic exercises, 97530- Therapeutic activity, 97112- Neuromuscular re-education, 97535- Self Care, [id number]- Manual therapy, [phone]- Orthotic Initial, [phone]- Orthotic/Prosthetic subsequent, Patient/Family education, Balance training, Joint mobilization, Therapeutic exercises, Therapeutic activity, Neuromuscular re-education, Gait training, and Self Care  PLAN FOR NEXT SESSION: instruct pt in self MLD, did she get compression bra?, give end range R shoulder stretching  Florina Lanis Carbon, PT 07/11/2024, 4:54 PM

## 2024-07-12 NOTE — Patient Instructions (Addendum)
 It was a pleasure to see you today as always. Glad you are doing well. Return in one year or as needed. Received flu vaccine and pneumococcal vaccine today.Continue current medications and return in one year or as needed.Colonoscopy deferred until January. She will call [facility].

## 2024-07-13 ENCOUNTER — Ambulatory Visit

## 2024-07-13 DIAGNOSIS — I890 Lymphedema, not elsewhere classified: Secondary | ICD-10-CM | POA: Diagnosis not present

## 2024-07-13 DIAGNOSIS — Z170 Estrogen receptor positive status [ER+]: Secondary | ICD-10-CM

## 2024-07-13 DIAGNOSIS — M25611 Stiffness of right shoulder, not elsewhere classified: Secondary | ICD-10-CM

## 2024-07-13 NOTE — Therapy (Signed)
 OUTPATIENT PHYSICAL THERAPY  UPPER EXTREMITY ONCOLOGY TREATMENT  Patient Name: Christie Molina MRN: [id number] DOB:Feb 13, 1961, 63 y.o., female Today's Date: 07/13/2024  END OF SESSION:  PT End of Session - 07/13/24 0909     Visit Number 4    Number of Visits 9    Date for Recertification  07/27/24    Authorization Type none needed    PT Start Time 0905    PT Stop Time 1000    PT Time Calculation (min) 55 min    Activity Tolerance Patient tolerated treatment well    Behavior During Therapy WFL for tasks assessed/performed           Past Medical History:  Diagnosis Date   Breast cancer (HCC)    right breast IDC   GERD (gastroesophageal reflux disease)    Hyperlipidemia    Hypothyroidism    Thyroid disease    Vitamin D  deficiency    Past Surgical History:  Procedure Laterality Date   BREAST LUMPECTOMY WITH RADIOACTIVE SEED AND SENTINEL LYMPH NODE BIOPSY Right 10/13/2023   Procedure: RIGHT BREAST LUMPECTOMY WITH RADIOACTIVE SEED AND SENTINEL LYMPH NODE BIOPSY;  Surgeon: Curvin Deward MOULD, MD;  Location: [facility];  Service: General;  Laterality: Right;   COLONOSCOPY WITH PROPOFOL  N/A 07/24/2014   Procedure: COLONOSCOPY WITH PROPOFOL ;  Surgeon: Gladis MARLA Louder, MD;  Location: [facility];  Service: Endoscopy;  Laterality: N/A;   UPPER GASTROINTESTINAL ENDOSCOPY  08/2018   Patient Active Problem List   Diagnosis Date Noted   Genetic testing 10/01/2023   Malignant neoplasm of upper-outer quadrant of right breast in female, estrogen receptor positive (HCC) 09/20/2023   Hyperplastic polyps of stomach 06/05/2019   Anxiety state 07/06/2013   Hypothyroidism 06/29/2011   GE reflux 06/29/2011   Hyperlipidemia 06/29/2011    PCP: Ronal Hailstone, MD  REFERRING PROVIDER: Dr. Mackey Chad  REFERRING DIAG: [phone] (ICD-10-CM) - Malignant neoplasm of upper-outer quadrant of right breast in female, estrogen receptor positive (HCC)   THERAPY DIAG:  Lymphedema,  not elsewhere classified  Stiffness of right shoulder, not elsewhere classified  Malignant neoplasm of upper-outer quadrant of right breast in female, estrogen receptor positive (HCC)  ONSET DATE: 04/04/2024  Rationale for Evaluation and Treatment: Rehabilitation  SUBJECTIVE:                                                                                                                                                                                           SUBJECTIVE STATEMENT: I have an appt next week at [facility]. I need to get better at the massage.   PERTINENT HISTORY:  Patient was  diagnosed on 09/10/2023 with right grade 2 invasive ductal carcinoma breast cancer. It measures 8 mm and is located in the upper outer quadrant. It is ER/PR positive, HER2 neg with a Ki67 of 10%. 10/13/23- R breast lumpectomy and SLNB (0/5) Completed  radiation.   PAIN:  Are you having pain? No  PRECAUTIONS: Other: at risk of R lymphedema  RED FLAGS: None   WEIGHT BEARING RESTRICTIONS: No  FALLS:  Has patient fallen in last 6 months? No  LIVING ENVIRONMENT: Lives with: lives with their spouse Lives in: House/apartment Has following equipment at home: None  OCCUPATION: full time, Chiropodist  LEISURE: does not exercise  HAND DOMINANCE: right   PRIOR LEVEL OF FUNCTION: Independent  PATIENT GOALS: to reduce the breast swelling   OBJECTIVE: Note: Objective measures were completed at Evaluation unless otherwise noted.  COGNITION: Overall cognitive status: Within functional limits for tasks assessed   PALPATION: Fibrosis present in inferior breast  OBSERVATIONS / OTHER ASSESSMENTS: breast is about 25% larger than L breast with increased pore size noted  POSTURE: forward head, rounded shoulders  UPPER EXTREMITY AROM/PROM: WFL but does still have tightness at end range on the R  UPPER EXTREMITY STRENGTH: 5/5  LYMPHEDEMA ASSESSMENTS:   SURGERY TYPE/DATE: R breast  lumpectomy on 10/13/23  NUMBER OF LYMPH NODES REMOVED: 0/5  CHEMOTHERAPY: did not require  RADIATION:completed  HORMONE TREATMENT: on anastrozole   INFECTIONS: none   LYMPHEDEMA ASSESSMENTS: recently had ldex screen which was normal   BREAST COMPLAINTS SURVEY: BREAST COMPLAINTS QUESTIONNAIRE Pain: 1 Heaviness: 4 Swollen feeling: 10 Tense Skin: 2 Redness: 1 Bra Print: 10 Size of Pores: 3 Hard feeling:  4 Total:     35/80 A Score over 9 indicates lymphedema issues in the breast                                                                                                                             TREATMENT DATE:  07/13/24: Manual Therapy MLD in supine: Short neck, 5 diaphragmatic breaths, Lt axillary nodes and establishment of anterior interaxillary pathway, Rt inguinal nodes and establishment of Rt axilloinguinal pathway, then Rt superior and medial breast moving fluid towards anterior pathway, spending extra time in any areas of fibrosis, then into Lt S/L for focus to lateral and inferior breast redirecting towards lateral pathway, then finished then retracing all steps in supine having pt return demo assessing her pressure and technique. Min VC's for less pressure but overall pt doing well with this.  P/ROM during MLD into flex and abd with scapular depression throughout and educated her about incorporating end ROM stretches into her day STM to scar tissue in Rt axillary during P/ROM, this was mod tight and softened significantly by end of session, instructed pt in same  07/11/24: In supine: Short neck, 5 diaphragmatic breaths, L axillary nodes and establishment of interaxillary pathway, R inguinal nodes and establishment of axilloinguinal pathway, then R breast moving fluid towards pathways spending  extra time in any areas of fibrosis then retracing all steps. Instructed pt throughout and had her return demonstrate each step while therapist provided appropriate v/c and t/c. Pt  requiring less cueing today compared with last session.   07/04/24: In supine: Short neck, 5 diaphragmatic breaths, L axillary nodes and establishment of interaxillary pathway, R inguinal nodes and establishment of axilloinguinal pathway, then R breast moving fluid towards pathways spending extra time in any areas of fibrosis then retracing all steps. Instructed pt throughout and had her return demonstrate each step while therapist provided appropriate v/c and t/c. Issued handout for pt to begin to practice self MLD at home.        PATIENT EDUCATION:  Education details: anatomy and physiology of the lymphatic system, need for compression bra and self MLD to manage Person educated: Patient Education method: Explanation Education comprehension: verbalized understanding  HOME EXERCISE PROGRAM: Obtain compression bra  ASSESSMENT:  CLINICAL IMPRESSION: Pt reports still not wearing her compression bra during day as she doesn't like how her breasts look in them so has appt next week to try other compression bras. Reminded pt that the more she is in compression the better her results will be with reducing her lymphedema. She is sleeping in them at night but does understand only wearing at night isn't enough to counteract lymphedema so is hopeful to find a better fitting bra next week. Continued with MLD to Rt breast and softening of inferior breast fibrosis was noted by end of session. Pt benefited from review of self MLD today for lighter pressure and showed her how to better reach lateral breast in Lt S/L. Also tightness was noted from scar tissue in axilla but this also softened with STM and encouraged her to cont this at home with stretching. Pt verbalized good understanding.   OBJECTIVE IMPAIRMENTS: decreased knowledge of condition, decreased knowledge of use of DME, increased edema, increased fascial restrictions, and postural dysfunction.   ACTIVITY LIMITATIONS: none  PARTICIPATION  LIMITATIONS: none  PERSONAL FACTORS: Time since onset of injury/illness/exacerbation are also affecting patient's functional outcome.   REHAB POTENTIAL: Good  CLINICAL DECISION MAKING: Stable/uncomplicated  EVALUATION COMPLEXITY: Low  GOALS: Goals reviewed with patient? Yes  SHORT TERM GOALS=LONG TERM GOALS Target date: 07/27/24  Pt will be indepndent in self MLD for long term management of lymphedema.  Baseline: Goal status: INITIAL  2.  Pt will obtain a compression bra for managmenet of lymphedema.  Baseline:  Goal status: INITIAL  3.  Pt will report a 50% improvement in feeling of heaviness in R breast to allow improved comfort.  Baseline:  Goal status: INITIAL  4.  Pt will be indepennt in a home exercise program for continued stretching and strengtheing.  Baseline:  Goal status: INITIAL   PLAN:  PT FREQUENCY: 2x/week  PT DURATION: 4 weeks  PLANNED INTERVENTIONS: 97164- PT Re-evaluation, 97110-Therapeutic exercises, 97530- Therapeutic activity, 97112- Neuromuscular re-education, 97535- Self Care, 02859- Manual therapy, [phone]- Orthotic Initial, [phone]- Orthotic/Prosthetic subsequent, Patient/Family education, Balance training, Joint mobilization, Therapeutic exercises, Therapeutic activity, Neuromuscular re-education, Gait training, and Self Care  PLAN FOR NEXT SESSION: Cont to instruct pt in self MLD, did she get compression bra? (Appt is 10/14), give end range R shoulder stretching  Aden Berwyn Caldron, PTA 07/13/2024, 12:39 PM

## 2024-07-18 ENCOUNTER — Encounter: Admitting: Physical Therapy

## 2024-07-18 ENCOUNTER — Encounter: Payer: Self-pay | Admitting: Internal Medicine

## 2024-07-18 ENCOUNTER — Ambulatory Visit (INDEPENDENT_AMBULATORY_CARE_PROVIDER_SITE_OTHER): Admitting: Internal Medicine

## 2024-07-18 VITALS — BP 120/80 | HR 68 | Ht 61.0 in | Wt 137.0 lb

## 2024-07-18 DIAGNOSIS — R0789 Other chest pain: Secondary | ICD-10-CM | POA: Diagnosis not present

## 2024-07-18 DIAGNOSIS — C50911 Malignant neoplasm of unspecified site of right female breast: Secondary | ICD-10-CM | POA: Diagnosis not present

## 2024-07-18 DIAGNOSIS — E039 Hypothyroidism, unspecified: Secondary | ICD-10-CM

## 2024-07-18 MED ORDER — CYCLOBENZAPRINE HCL 10 MG PO TABS: 10.0000 mg | ORAL_TABLET | Freq: Every day | ORAL | 1 refills | Status: AC

## 2024-07-18 MED ORDER — MELOXICAM 15 MG PO TABS: 15.0000 mg | ORAL_TABLET | Freq: Every day | ORAL | 1 refills | Status: AC

## 2024-07-18 NOTE — Progress Notes (Signed)
 Patient Care Team: Perri Ronal PARAS, MD as PCP - General (Internal Medicine) Cleotilde Ronal RAMAN, MD as Consulting Physician (Gynecology) Curvin Deward MOULD, MD as Consulting Physician (General Surgery) Odean Potts, MD as Consulting Physician (Hematology and Oncology) Dewey Rush, MD as Consulting Physician (Radiation Oncology)  Visit Date: 07/18/24  Subjective:    Patient ID: Christie Molina , Female   DOB: [birth year], 63 y.o.    MRN: [id number]   63 y.o. Female presents today for Back spasms. Patient has a past medical history of Breast cancer, Hyperlipidemia, Hypothyroidism.  A couple weeks ago when she leaned over the console of her husband's car, she apparently strained her left trunk acutely. She said that this injury has improved but now she is experiencing back spasms. She says pain started yesterday and became worse today. She also experienced brief episode of  back spasms three weeks ago. She says when she sits up her back hurts. She has been taking Aleve for the pain. She identifies the  p ain as left sided over lower rib cage area.  Vaccine counseling: UTD on Influenza vaccine, Shingles vaccine due.   Patient underwent right breast lumpectomy January 2025 by Dr. Curvin. Is under the care of Dr. Odean at the [facility].Tumor was ER positive, PR positive, HER2 positive.  0 out of 5 lymph nodes negative.  Pathology showed invasive ductal carcinoma.  Lymphovascular invasion was not identified.    Past Medical History:  Diagnosis Date   Breast cancer (HCC)    right breast IDC   GERD (gastroesophageal reflux disease)    Hyperlipidemia    Hypothyroidism    Thyroid disease    Vitamin D  deficiency      Family History  Problem Relation Age of Onset   Heart disease Father    Pancreatic cancer Maternal Aunt 32   Breast cancer Paternal Aunt 38 - 74   Breast cancer Cousin        maternal first cousin   Social History Narrative:  Married. 2 daughters. Husband is an Midwife for  the city of [place]. She retired from [employer] and [employer]. Non-smoker.     Review of Systems  Musculoskeletal:  Positive for back pain.        Objective:   Vitals: BP 120/80   Pulse 68   Ht 5' 1 (1.549 m)   Wt 137 lb (62.1 kg)   LMP 07/22/2013 (Exact Date)   SpO2 98%   BMI 25.89 kg/m    Physical Exam Musculoskeletal:     Comments: Pain around T-10 to left of spine, no point tenderness.        Results:    Labs:       Component Value Date/Time   NA 139 07/03/2024 0910   K 4.5 07/03/2024 0910   CL 102 07/03/2024 0910   CO2 29 07/03/2024 0910   GLUCOSE 99 07/03/2024 0910   BUN 9 07/03/2024 0910   CREATININE 0.79 07/03/2024 0910   CALCIUM 9.5 07/03/2024 0910   PROT 6.6 07/03/2024 0910   ALBUMIN 4.2 09/22/2023 1242   AST 18 07/03/2024 0910   AST 19 09/22/2023 1242   ALT 14 07/03/2024 0910   ALT 17 09/22/2023 1242   ALKPHOS 59 09/22/2023 1242   BILITOT 0.6 07/03/2024 0910   BILITOT 0.4 09/22/2023 1242   GFRNONAA >60 09/22/2023 1242   GFRNONAA 74 06/17/2020 0925   GFRAA 86 06/17/2020 0925     Lab Results  Component Value Date  WBC 3.3 (L) 07/03/2024   HGB 13.4 07/03/2024   HCT 40.6 07/03/2024   MCV 93.8 07/03/2024   PLT 202 07/03/2024    Lab Results  Component Value Date   CHOL 217 (H) 07/03/2024   HDL 95 07/03/2024   LDLCALC 106 (H) 07/03/2024   TRIG 70 07/03/2024   CHOLHDL 2.3 07/03/2024       Lab Results  Component Value Date   TSH 0.49 07/03/2024         Assessment & Plan:   Musculoskeletal strain left parathoracic area.  A couple weeks ago when she leaned over the console of her husbands car, she hurt her left side. She said that the injury has gotten better but now she is experiencing back spasms. It stated yesterday and became worse today. She also experienced back spasms three weeks ago. She said when she sits up, her back hurts. She has been taking Aleve for the pain. Physical exam showed the pain is around T-10 to the left  of her spine with no point tenderness.    Have prescribed: Meloxicam  15 mg daily, Flexeril 10 mg at bedtime  Recommended to alternate putting ice and  heat on the area for 20 minutes at a time 2-3 times a day.   If pain worsens or does not improve after 7 to 10 days, she may recontact me and we will consider referring her to physical therapy and perhaps getting x-ray of her back.  Vaccine counseling: UTD on Influenza vaccine, Shingles vaccine due.  Hypothyroidism treated with thyroid replacement medication  History of right breast cancer [facility] status post lumpectomy December 2024    I,Makayla C Reid,acting as a scribe for Ronal JINNY Hailstone, MD.,have documented all relevant documentation on the behalf of Ronal JINNY Hailstone, MD,as directed by  Ronal JINNY Hailstone, MD while in the presence of Ronal JINNY Hailstone, MD.

## 2024-07-20 ENCOUNTER — Encounter: Admitting: Physical Therapy

## 2024-07-23 ENCOUNTER — Encounter: Payer: Self-pay | Admitting: Internal Medicine

## 2024-07-23 NOTE — Patient Instructions (Signed)
 Suggest alternating ice and heat on the left chest wall area for 20 minutes 2-3 times a day.  Take meloxicam  15 mg daily for 7 to 10 days and may take Flexeril 10 mg at bedtime as a muscle relaxant if needed.  Call if not better in 7 to 10 days or sooner if worse.

## 2024-07-24 ENCOUNTER — Encounter: Payer: Self-pay | Admitting: Physical Therapy

## 2024-07-24 ENCOUNTER — Ambulatory Visit: Admitting: Physical Therapy

## 2024-07-24 DIAGNOSIS — I890 Lymphedema, not elsewhere classified: Secondary | ICD-10-CM | POA: Diagnosis not present

## 2024-07-24 DIAGNOSIS — M25611 Stiffness of right shoulder, not elsewhere classified: Secondary | ICD-10-CM

## 2024-07-24 DIAGNOSIS — C50411 Malignant neoplasm of upper-outer quadrant of right female breast: Secondary | ICD-10-CM

## 2024-07-24 NOTE — Therapy (Signed)
 OUTPATIENT PHYSICAL THERAPY  UPPER EXTREMITY ONCOLOGY TREATMENT  Patient Name: Christie Molina MRN: [id number] DOB:[birth year], 63 y.o., female Today's Date: 07/24/2024  END OF SESSION:  PT End of Session - 07/24/24 0805     Visit Number 5    Number of Visits 9    Date for Recertification  07/27/24    Authorization Type none needed    PT Start Time 0804    PT Stop Time 0853    PT Time Calculation (min) 49 min    Activity Tolerance Patient tolerated treatment well    Behavior During Therapy [place] for tasks assessed/performed           Past Medical History:  Diagnosis Date   Breast cancer (HCC)    right breast IDC   GERD (gastroesophageal reflux disease)    Hyperlipidemia    Hypothyroidism    Thyroid disease    Vitamin D  deficiency    Past Surgical History:  Procedure Laterality Date   BREAST LUMPECTOMY WITH RADIOACTIVE SEED AND SENTINEL LYMPH NODE BIOPSY Right 10/13/2023   Procedure: RIGHT BREAST LUMPECTOMY WITH RADIOACTIVE SEED AND SENTINEL LYMPH NODE BIOPSY;  Surgeon: Curvin Deward MOULD, MD;  Location: [facility];  Service: General;  Laterality: Right;   COLONOSCOPY WITH PROPOFOL  N/A 07/24/2014   Procedure: COLONOSCOPY WITH PROPOFOL ;  Surgeon: Gladis MARLA Louder, MD;  Location: [facility];  Service: Endoscopy;  Laterality: N/A;   UPPER GASTROINTESTINAL ENDOSCOPY  08/2018   Patient Active Problem List   Diagnosis Date Noted   Genetic testing 10/01/2023   Malignant neoplasm of upper-outer quadrant of right breast in female, estrogen receptor positive (HCC) 09/20/2023   Hyperplastic polyps of stomach 06/05/2019   Anxiety state 07/06/2013   Hypothyroidism 06/29/2011   GE reflux 06/29/2011   Hyperlipidemia 06/29/2011    PCP: Ronal Hailstone, MD  REFERRING PROVIDER: Dr. Mackey Chad  REFERRING DIAG: [phone] (ICD-10-CM) - Malignant neoplasm of upper-outer quadrant of right breast in female, estrogen receptor positive (HCC)   THERAPY DIAG:  Lymphedema,  not elsewhere classified  Stiffness of right shoulder, not elsewhere classified  Malignant neoplasm of upper-outer quadrant of right breast in female, estrogen receptor positive (HCC)  ONSET DATE: 04/04/2024  Rationale for Evaluation and Treatment: Rehabilitation  SUBJECTIVE:                                                                                                                                                                                           SUBJECTIVE STATEMENT: The swelling went down quite a bit after the last appointment and it lasted for a couple of days and now it is back. I  was supposed to get measured last Tuesday and they called to reschedule to October 31.   PERTINENT HISTORY:  Patient was diagnosed on 09/10/2023 with right grade 2 invasive ductal carcinoma breast cancer. It measures 8 mm and is located in the upper outer quadrant. It is ER/PR positive, HER2 neg with a Ki67 of 10%. 10/13/23- R breast lumpectomy and SLNB (0/5) Completed  radiation.   PAIN:  Are you having pain? No  PRECAUTIONS: Other: at risk of R lymphedema  RED FLAGS: None   WEIGHT BEARING RESTRICTIONS: No  FALLS:  Has patient fallen in last 6 months? No  LIVING ENVIRONMENT: Lives with: lives with their spouse Lives in: House/apartment Has following equipment at home: None  OCCUPATION: full time, Chiropodist  LEISURE: does not exercise  HAND DOMINANCE: right   PRIOR LEVEL OF FUNCTION: Independent  PATIENT GOALS: to reduce the breast swelling   OBJECTIVE: Note: Objective measures were completed at Evaluation unless otherwise noted.  COGNITION: Overall cognitive status: Within functional limits for tasks assessed   PALPATION: Fibrosis present in inferior breast  OBSERVATIONS / OTHER ASSESSMENTS: breast is about 25% larger than L breast with increased pore size noted  POSTURE: forward head, rounded shoulders  UPPER EXTREMITY AROM/PROM: WFL but does still have  tightness at end range on the R  UPPER EXTREMITY STRENGTH: 5/5  LYMPHEDEMA ASSESSMENTS:   SURGERY TYPE/DATE: R breast lumpectomy on 10/13/23  NUMBER OF LYMPH NODES REMOVED: 0/5  CHEMOTHERAPY: did not require  RADIATION:completed  HORMONE TREATMENT: on anastrozole   INFECTIONS: none   LYMPHEDEMA ASSESSMENTS: recently had ldex screen which was normal   BREAST COMPLAINTS SURVEY: BREAST COMPLAINTS QUESTIONNAIRE Pain: 1 Heaviness: 4 Swollen feeling: 10 Tense Skin: 2 Redness: 1 Bra Print: 10 Size of Pores: 3 Hard feeling:  4 Total:     35/80 A Score over 9 indicates lymphedema issues in the breast                                                                                                                             TREATMENT DATE:  07/24/24:  Had pt demonstrate self MLD at beginning of session. She required v/c for speed and for sequence but demonstrated good skin stretch. Therapist took over after pt had demonstrate entire sequence as follows: In supine: Short neck, 5 diaphragmatic breaths, L axillary nodes and establishment of interaxillary pathway, R inguinal nodes and establishment of axilloinguinal pathway, then R breast moving fluid towards pathways spending extra time in any areas of fibrosis then retracing all steps.   07/13/24: Manual Therapy MLD in supine: Short neck, 5 diaphragmatic breaths, Lt axillary nodes and establishment of anterior interaxillary pathway, Rt inguinal nodes and establishment of Rt axilloinguinal pathway, then Rt superior and medial breast moving fluid towards anterior pathway, spending extra time in any areas of fibrosis, then into Lt S/L for focus to lateral and inferior breast redirecting towards lateral pathway, then finished then retracing all steps in supine  having pt return demo assessing her pressure and technique. Min VC's for less pressure but overall pt doing well with this.  P/ROM during MLD into flex and abd with scapular depression  throughout and educated her about incorporating end ROM stretches into her day STM to scar tissue in Rt axillary during P/ROM, this was mod tight and softened significantly by end of session, instructed pt in same  07/11/24: In supine: Short neck, 5 diaphragmatic breaths, L axillary nodes and establishment of interaxillary pathway, R inguinal nodes and establishment of axilloinguinal pathway, then R breast moving fluid towards pathways spending extra time in any areas of fibrosis then retracing all steps. Instructed pt throughout and had her return demonstrate each step while therapist provided appropriate v/c and t/c. Pt requiring less cueing today compared with last session.   07/04/24: In supine: Short neck, 5 diaphragmatic breaths, L axillary nodes and establishment of interaxillary pathway, R inguinal nodes and establishment of axilloinguinal pathway, then R breast moving fluid towards pathways spending extra time in any areas of fibrosis then retracing all steps. Instructed pt throughout and had her return demonstrate each step while therapist provided appropriate v/c and t/c. Issued handout for pt to begin to practice self MLD at home.        PATIENT EDUCATION:  Education details: anatomy and physiology of the lymphatic system, need for compression bra and self MLD to manage Person educated: Patient Education method: Explanation Education comprehension: verbalized understanding  HOME EXERCISE PROGRAM: Obtain compression bra  ASSESSMENT:  CLINICAL IMPRESSION: Pt was supposed to be measured for a compression bra last week but this was pushed out to Halloween because they had to cancel her appointment. Educated her to work on her self MLD at night and don her compression bra that she has right afterwards for maximal benefits. Focused entire session on MLD to R breast since pt felt it had become more swollen again. She reports it was down for several days after last session before the  swelling returned.    OBJECTIVE IMPAIRMENTS: decreased knowledge of condition, decreased knowledge of use of DME, increased edema, increased fascial restrictions, and postural dysfunction.   ACTIVITY LIMITATIONS: none  PARTICIPATION LIMITATIONS: none  PERSONAL FACTORS: Time since onset of injury/illness/exacerbation are also affecting patient's functional outcome.   REHAB POTENTIAL: Good  CLINICAL DECISION MAKING: Stable/uncomplicated  EVALUATION COMPLEXITY: Low  GOALS: Goals reviewed with patient? Yes  SHORT TERM GOALS=LONG TERM GOALS Target date: 07/27/24  Pt will be indepndent in self MLD for long term management of lymphedema.  Baseline: Goal status: INITIAL  2.  Pt will obtain a compression bra for managmenet of lymphedema.  Baseline:  Goal status: INITIAL  3.  Pt will report a 50% improvement in feeling of heaviness in R breast to allow improved comfort.  Baseline:  Goal status: INITIAL  4.  Pt will be indepennt in a home exercise program for continued stretching and strengtheing.  Baseline:  Goal status: INITIAL   PLAN:  PT FREQUENCY: 2x/week  PT DURATION: 4 weeks  PLANNED INTERVENTIONS: 97164- PT Re-evaluation, 97110-Therapeutic exercises, 97530- Therapeutic activity, 97112- Neuromuscular re-education, 97535- Self Care, [id number]- Manual therapy, [phone]- Orthotic Initial, [phone]- Orthotic/Prosthetic subsequent, Patient/Family education, Balance training, Joint mobilization, Therapeutic exercises, Therapeutic activity, Neuromuscular re-education, Gait training, and Self Care  PLAN FOR NEXT SESSION: decreased to 1x/wk and recert at next session, Cont to instruct pt in self MLD, did she get compression bra? (Appt is 10/31), give end range R shoulder stretching  Florina Sever  Blue, PT 07/24/2024, 8:59 AM

## 2024-07-27 ENCOUNTER — Encounter: Payer: Self-pay | Admitting: Physical Therapy

## 2024-07-27 ENCOUNTER — Ambulatory Visit: Admitting: Physical Therapy

## 2024-07-27 DIAGNOSIS — M25611 Stiffness of right shoulder, not elsewhere classified: Secondary | ICD-10-CM

## 2024-07-27 DIAGNOSIS — Z170 Estrogen receptor positive status [ER+]: Secondary | ICD-10-CM

## 2024-07-27 DIAGNOSIS — I890 Lymphedema, not elsewhere classified: Secondary | ICD-10-CM

## 2024-07-27 NOTE — Therapy (Signed)
 OUTPATIENT PHYSICAL THERAPY  UPPER EXTREMITY ONCOLOGY TREATMENT  Patient Name: Christie Molina MRN: [id number] DOB:02/05/1961, 63 y.o., female Today's Date: 07/27/2024  END OF SESSION:  PT End of Session - 07/27/24 1602     Visit Number 6    Number of Visits 10    Date for Recertification  08/24/24    Authorization Type none needed    PT Start Time 1602    PT Stop Time 1656    PT Time Calculation (min) 54 min    Activity Tolerance Patient tolerated treatment well    Behavior During Therapy WFL for tasks assessed/performed           Past Medical History:  Diagnosis Date   Breast cancer (HCC)    right breast IDC   GERD (gastroesophageal reflux disease)    Hyperlipidemia    Hypothyroidism    Thyroid disease    Vitamin D  deficiency    Past Surgical History:  Procedure Laterality Date   BREAST LUMPECTOMY WITH RADIOACTIVE SEED AND SENTINEL LYMPH NODE BIOPSY Right 10/13/2023   Procedure: RIGHT BREAST LUMPECTOMY WITH RADIOACTIVE SEED AND SENTINEL LYMPH NODE BIOPSY;  Surgeon: Curvin Deward MOULD, MD;  Location: [facility];  Service: General;  Laterality: Right;   COLONOSCOPY WITH PROPOFOL  N/A 07/24/2014   Procedure: COLONOSCOPY WITH PROPOFOL ;  Surgeon: Gladis MARLA Louder, MD;  Location: [facility];  Service: Endoscopy;  Laterality: N/A;   UPPER GASTROINTESTINAL ENDOSCOPY  08/2018   Patient Active Problem List   Diagnosis Date Noted   Genetic testing 10/01/2023   Malignant neoplasm of upper-outer quadrant of right breast in female, estrogen receptor positive (HCC) 09/20/2023   Hyperplastic polyps of stomach 06/05/2019   Anxiety state 07/06/2013   Hypothyroidism 06/29/2011   GE reflux 06/29/2011   Hyperlipidemia 06/29/2011    PCP: Ronal Hailstone, MD  REFERRING PROVIDER: Dr. Mackey Chad  REFERRING DIAG: [id number] (ICD-10-CM) - Malignant neoplasm of upper-outer quadrant of right breast in female, estrogen receptor positive (HCC)   THERAPY DIAG:  Lymphedema,  not elsewhere classified - Plan: PT plan of care cert/re-cert  Stiffness of right shoulder, not elsewhere classified - Plan: PT plan of care cert/re-cert  Malignant neoplasm of upper-outer quadrant of right breast in female, estrogen receptor positive (HCC) - Plan: PT plan of care cert/re-cert  ONSET DATE: 04/04/2024  Rationale for Evaluation and Treatment: Rehabilitation  SUBJECTIVE:                                                                                                                                                                                           SUBJECTIVE STATEMENT: The swelling went down  quite a bit after the last appointment and it lasted for a couple of days and now it is back. I was supposed to get measured last Tuesday and they called to reschedule to October 31.   PERTINENT HISTORY:  Patient was diagnosed on 09/10/2023 with right grade 2 invasive ductal carcinoma breast cancer. It measures 8 mm and is located in the upper outer quadrant. It is ER/PR positive, HER2 neg with a Ki67 of 10%. 10/13/23- R breast lumpectomy and SLNB (0/5) Completed  radiation.   PAIN:  Are you having pain? No  PRECAUTIONS: Other: at risk of R lymphedema  RED FLAGS: None   WEIGHT BEARING RESTRICTIONS: No  FALLS:  Has patient fallen in last 6 months? No  LIVING ENVIRONMENT: Lives with: lives with their spouse Lives in: House/apartment Has following equipment at home: None  OCCUPATION: full time, Chiropodist  LEISURE: does not exercise  HAND DOMINANCE: right   PRIOR LEVEL OF FUNCTION: Independent  PATIENT GOALS: to reduce the breast swelling   OBJECTIVE: Note: Objective measures were completed at Evaluation unless otherwise noted.  COGNITION: Overall cognitive status: Within functional limits for tasks assessed   PALPATION: Fibrosis present in inferior breast  OBSERVATIONS / OTHER ASSESSMENTS: breast is about 25% larger than L breast with increased pore size  noted  POSTURE: forward head, rounded shoulders  UPPER EXTREMITY AROM/PROM: WFL but does still have tightness at end range on the R  UPPER EXTREMITY STRENGTH: 5/5  LYMPHEDEMA ASSESSMENTS:   SURGERY TYPE/DATE: R breast lumpectomy on 10/13/23  NUMBER OF LYMPH NODES REMOVED: 0/5  CHEMOTHERAPY: did not require  RADIATION:completed  HORMONE TREATMENT: on anastrozole   INFECTIONS: none   LYMPHEDEMA ASSESSMENTS: recently had ldex screen which was normal   BREAST COMPLAINTS SURVEY: BREAST COMPLAINTS QUESTIONNAIRE Pain: 1 Heaviness: 4 Swollen feeling: 10 Tense Skin: 2 Redness: 1 Bra Print: 10 Size of Pores: 3 Hard feeling:  4 Total:     35/80 A Score over 9 indicates lymphedema issues in the breast                                                                                                                             TREATMENT DATE:  07/27/24:  MLD to L breast as follows: In supine: Short neck, 5 diaphragmatic breaths, L axillary nodes and establishment of interaxillary pathway, R inguinal nodes and establishment of axilloinguinal pathway, then R breast moving fluid towards pathways spending extra time in any areas of fibrosis then retracing all steps. Had pt demonstrate technique and she required less cues today. Educated her that using a metronome might help with speed. Created a foam chip pack for pt to wear in her bra at lateral breast for additional compression.   07/24/24:  Had pt demonstrate self MLD at beginning of session. She required v/c for speed and for sequence but demonstrated good skin stretch. Therapist took over after pt had demonstrate entire sequence as follows: In supine: Short neck,  5 diaphragmatic breaths, L axillary nodes and establishment of interaxillary pathway, R inguinal nodes and establishment of axilloinguinal pathway, then R breast moving fluid towards pathways spending extra time in any areas of fibrosis then retracing all steps.    07/13/24: Manual Therapy MLD in supine: Short neck, 5 diaphragmatic breaths, Lt axillary nodes and establishment of anterior interaxillary pathway, Rt inguinal nodes and establishment of Rt axilloinguinal pathway, then Rt superior and medial breast moving fluid towards anterior pathway, spending extra time in any areas of fibrosis, then into Lt S/L for focus to lateral and inferior breast redirecting towards lateral pathway, then finished then retracing all steps in supine having pt return demo assessing her pressure and technique. Min VC's for less pressure but overall pt doing well with this.  P/ROM during MLD into flex and abd with scapular depression throughout and educated her about incorporating end ROM stretches into her day STM to scar tissue in Rt axillary during P/ROM, this was mod tight and softened significantly by end of session, instructed pt in same  07/11/24: In supine: Short neck, 5 diaphragmatic breaths, L axillary nodes and establishment of interaxillary pathway, R inguinal nodes and establishment of axilloinguinal pathway, then R breast moving fluid towards pathways spending extra time in any areas of fibrosis then retracing all steps. Instructed pt throughout and had her return demonstrate each step while therapist provided appropriate v/c and t/c. Pt requiring less cueing today compared with last session.   07/04/24: In supine: Short neck, 5 diaphragmatic breaths, L axillary nodes and establishment of interaxillary pathway, R inguinal nodes and establishment of axilloinguinal pathway, then R breast moving fluid towards pathways spending extra time in any areas of fibrosis then retracing all steps. Instructed pt throughout and had her return demonstrate each step while therapist provided appropriate v/c and t/c. Issued handout for pt to begin to practice self MLD at home.        PATIENT EDUCATION:  Education details: anatomy and physiology of the lymphatic system, need for  compression bra and self MLD to manage Person educated: Patient Education method: Explanation Education comprehension: verbalized understanding  HOME EXERCISE PROGRAM: Obtain compression bra  ASSESSMENT:  CLINICAL IMPRESSION: Pt has been sleeping in her compression bra. Created a foam chip pack for pt to add to her bra for additional compression. Pt should get measured for a compression bra for day time wear next week. She has been performing her self MLD daily and has been spending more time. She is progressing towards independence. She is progressing towards all goals int therapy and would benefit from additional skilled PT visits to become independent with self MLD, obtain an appropriate compression bra and have therapist assess for fit and to continue to decrease feelings of heaviness in R breast.    OBJECTIVE IMPAIRMENTS: decreased knowledge of condition, decreased knowledge of use of DME, increased edema, increased fascial restrictions, and postural dysfunction.   ACTIVITY LIMITATIONS: none  PARTICIPATION LIMITATIONS: none  PERSONAL FACTORS: Time since onset of injury/illness/exacerbation are also affecting patient's functional outcome.   REHAB POTENTIAL: Good  CLINICAL DECISION MAKING: Stable/uncomplicated  EVALUATION COMPLEXITY: Low  GOALS: Goals reviewed with patient? Yes  SHORT TERM GOALS=LONG TERM GOALS Target date: 07/27/24  Pt will be indepndent in self MLD for long term management of lymphedema.  Baseline: Goal status: IN PROGRESS 07/27/24  2.  Pt will obtain a compression bra for managmenet of lymphedema.  Baseline:  Goal status: 07/27/24- IN PROGRESS - pt has appt to be measured  on 08/04/24  3.  Pt will report a 50% improvement in feeling of heaviness in R breast to allow improved comfort.  Baseline:  Goal status: 07/27/24 IN PROGRESS - 25% improvement  4.  Pt will be independent in a home exercise program for continued stretching and strengtheing.   Baseline:  Goal status: 07/27/24- IN PROGRESS   PLAN:  PT FREQUENCY: 1x/week  PT DURATION: 4 weeks  PLANNED INTERVENTIONS: 97164- PT Re-evaluation, 97110-Therapeutic exercises, 97530- Therapeutic activity, 97112- Neuromuscular re-education, 97535- Self Care, 02859- Manual therapy, [phone]- Orthotic Initial, [phone]- Orthotic/Prosthetic subsequent, Patient/Family education, Balance training, Joint mobilization, Therapeutic exercises, Therapeutic activity, Neuromuscular re-education, Gait training, and Self Care  PLAN FOR NEXT SESSION: Cont to instruct pt in self MLD, did she get compression bra? (Appt is 10/31), give end range R shoulder stretching  [facility], PT 07/27/2024, 5:01 PM

## 2024-07-31 ENCOUNTER — Other Ambulatory Visit: Payer: Self-pay | Admitting: Internal Medicine

## 2024-07-31 ENCOUNTER — Other Ambulatory Visit: Payer: Self-pay

## 2024-07-31 DIAGNOSIS — E039 Hypothyroidism, unspecified: Secondary | ICD-10-CM

## 2024-07-31 MED ORDER — LEVOTHYROXINE SODIUM 75 MCG PO TABS: ORAL_TABLET | ORAL | 3 refills | Status: AC

## 2024-08-01 ENCOUNTER — Ambulatory Visit

## 2024-08-03 ENCOUNTER — Ambulatory Visit: Admitting: Physical Therapy

## 2024-08-03 ENCOUNTER — Encounter: Payer: Self-pay | Admitting: Physical Therapy

## 2024-08-03 DIAGNOSIS — C50411 Malignant neoplasm of upper-outer quadrant of right female breast: Secondary | ICD-10-CM

## 2024-08-03 DIAGNOSIS — I890 Lymphedema, not elsewhere classified: Secondary | ICD-10-CM | POA: Diagnosis not present

## 2024-08-03 DIAGNOSIS — M25611 Stiffness of right shoulder, not elsewhere classified: Secondary | ICD-10-CM

## 2024-08-03 NOTE — Therapy (Signed)
 OUTPATIENT PHYSICAL THERAPY  UPPER EXTREMITY ONCOLOGY TREATMENT  Patient Name: Christie Molina MRN: [id number] DOB:[birth year], 63 y.o., female Today's Date: 08/03/2024  END OF SESSION:  PT End of Session - 08/03/24 0856     Visit Number 7    Number of Visits 10    Date for Recertification  08/24/24    Authorization Type none needed    PT Start Time 0802    PT Stop Time 0854    PT Time Calculation (min) 52 min    Activity Tolerance Patient tolerated treatment well    Behavior During Therapy [facility] for tasks assessed/performed           Past Medical History:  Diagnosis Date   Breast cancer (HCC)    right breast IDC   GERD (gastroesophageal reflux disease)    Hyperlipidemia    Hypothyroidism    Thyroid disease    Vitamin D  deficiency    Past Surgical History:  Procedure Laterality Date   BREAST LUMPECTOMY WITH RADIOACTIVE SEED AND SENTINEL LYMPH NODE BIOPSY Right 10/13/2023   Procedure: RIGHT BREAST LUMPECTOMY WITH RADIOACTIVE SEED AND SENTINEL LYMPH NODE BIOPSY;  Surgeon: Curvin Deward MOULD, MD;  Location: [facility];  Service: General;  Laterality: Right;   COLONOSCOPY WITH PROPOFOL  N/A 07/24/2014   Procedure: COLONOSCOPY WITH PROPOFOL ;  Surgeon: Gladis MARLA Louder, MD;  Location: [facility];  Service: Endoscopy;  Laterality: N/A;   UPPER GASTROINTESTINAL ENDOSCOPY  08/2018   Patient Active Problem List   Diagnosis Date Noted   Genetic testing 10/01/2023   Malignant neoplasm of upper-outer quadrant of right breast in female, estrogen receptor positive (HCC) 09/20/2023   Hyperplastic polyps of stomach 06/05/2019   Anxiety state 07/06/2013   Hypothyroidism 06/29/2011   GE reflux 06/29/2011   Hyperlipidemia 06/29/2011    PCP: Ronal Hailstone, MD  REFERRING PROVIDER: Dr. Mackey Chad  REFERRING DIAG: [phone] (ICD-10-CM) - Malignant neoplasm of upper-outer quadrant of right breast in female, estrogen receptor positive (HCC)   THERAPY DIAG:  Lymphedema,  not elsewhere classified  Stiffness of right shoulder, not elsewhere classified  Malignant neoplasm of upper-outer quadrant of right breast in female, estrogen receptor positive (HCC)  ONSET DATE: 04/04/2024  Rationale for Evaluation and Treatment: Rehabilitation  SUBJECTIVE:                                                                                                                                                                                           SUBJECTIVE STATEMENT: I get measured for the compression bra tomorrow. I think the swelling is improving a little.   PERTINENT HISTORY:  Patient was diagnosed on  09/10/2023 with right grade 2 invasive ductal carcinoma breast cancer. It measures 8 mm and is located in the upper outer quadrant. It is ER/PR positive, HER2 neg with a Ki67 of 10%. 10/13/23- R breast lumpectomy and SLNB (0/5) Completed  radiation.   PAIN:  Are you having pain? No  PRECAUTIONS: Other: at risk of R lymphedema  RED FLAGS: None   WEIGHT BEARING RESTRICTIONS: No  FALLS:  Has patient fallen in last 6 months? 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  COGNITION: Overall cognitive status: Within functional limits for tasks assessed   PALPATION: Fibrosis present in inferior breast  OBSERVATIONS / OTHER ASSESSMENTS: breast is about 25% larger than L breast with increased pore size noted  POSTURE: forward head, rounded shoulders  UPPER EXTREMITY AROM/PROM: WFL but does still have tightness at end range on the R  UPPER EXTREMITY STRENGTH: 5/5  LYMPHEDEMA ASSESSMENTS:   SURGERY TYPE/DATE: R  breast lumpectomy on 10/13/23  NUMBER OF LYMPH NODES REMOVED: 0/5  CHEMOTHERAPY: did not require  RADIATION:completed  HORMONE TREATMENT: on anastrozole   INFECTIONS: none   LYMPHEDEMA ASSESSMENTS: recently had ldex screen which was normal   BREAST COMPLAINTS SURVEY: BREAST COMPLAINTS QUESTIONNAIRE Pain: 1 Heaviness: 4 Swollen feeling: 10 Tense Skin: 2 Redness: 1 Bra Print: 10 Size of Pores: 3 Hard feeling:  4 Total:     35/80 A Score over 9 indicates lymphedema issues in the breast                                                                                                                             TREATMENT DATE:  08/03/24:  MLD to L breast as follows: In supine: Short neck, 5 diaphragmatic breaths, L axillary nodes and establishment of interaxillary pathway, R inguinal nodes and establishment of axilloinguinal pathway, then R breast moving fluid towards pathways spending extra time in any areas of fibrosis then retracing all steps. Swelling has improved some today.   07/27/24:  MLD to L breast as follows: In supine: Short neck, 5 diaphragmatic breaths, L axillary nodes and establishment of interaxillary pathway, R inguinal nodes and establishment of axilloinguinal pathway, then R breast moving fluid towards pathways spending extra time in any areas of fibrosis then retracing all steps. Had pt demonstrate technique and she required less cues today. Educated her that using a metronome might help with speed. Created a foam chip pack for pt to wear in her bra at lateral breast for additional compression.   07/24/24:  Had pt demonstrate self MLD at beginning of session. She required v/c for speed and for sequence but demonstrated good skin stretch. Therapist took over  after pt had demonstrate entire sequence as follows: In supine: Short neck, 5 diaphragmatic breaths, L axillary nodes and establishment of interaxillary pathway, R inguinal nodes and establishment of axilloinguinal  pathway, then R breast moving fluid towards pathways spending extra time in any areas of fibrosis then retracing all steps.   07/13/24: Manual Therapy MLD in supine: Short neck, 5 diaphragmatic breaths, Lt axillary nodes and establishment of anterior interaxillary pathway, Rt inguinal nodes and establishment of Rt axilloinguinal pathway, then Rt superior and medial breast moving fluid towards anterior pathway, spending extra time in any areas of fibrosis, then into Lt S/L for focus to lateral and inferior breast redirecting towards lateral pathway, then finished then retracing all steps in supine having pt return demo assessing her pressure and technique. Min VC's for less pressure but overall pt doing well with this.  P/ROM during MLD into flex and abd with scapular depression throughout and educated her about incorporating end ROM stretches into her day STM to scar tissue in Rt axillary during P/ROM, this was mod tight and softened significantly by end of session, instructed pt in same  07/11/24: In supine: Short neck, 5 diaphragmatic breaths, L axillary nodes and establishment of interaxillary pathway, R inguinal nodes and establishment of axilloinguinal pathway, then R breast moving fluid towards pathways spending extra time in any areas of fibrosis then retracing all steps. Instructed pt throughout and had her return demonstrate each step while therapist provided appropriate v/c and t/c. Pt requiring less cueing today compared with last session.   07/04/24: In supine: Short neck, 5 diaphragmatic breaths, L axillary nodes and establishment of interaxillary pathway, R inguinal nodes and establishment of axilloinguinal pathway, then R breast moving fluid towards pathways spending extra time in any areas of fibrosis then retracing all steps. Instructed pt throughout and had her return demonstrate each step while therapist provided appropriate v/c and t/c. Issued handout for pt to begin to practice self MLD  at home.        PATIENT EDUCATION:  Education details: anatomy and physiology of the lymphatic system, need for compression bra and self MLD to manage Person educated: Patient Education method: Explanation Education comprehension: verbalized understanding  HOME EXERCISE PROGRAM: Obtain compression bra  ASSESSMENT:  CLINICAL IMPRESSION: Pt has made some adjustments to her self massage and reports her swelling has improved a little since last session. She will get measured for her compression bra tomorrow. The medial breast had gone down some but she still had fullness at lateral breast.     OBJECTIVE IMPAIRMENTS: decreased knowledge of condition, decreased knowledge of use of DME, increased edema, increased fascial restrictions, and postural dysfunction.   ACTIVITY LIMITATIONS: none  PARTICIPATION LIMITATIONS: none  PERSONAL FACTORS: Time since onset of injury/illness/exacerbation are also affecting patient's functional outcome.   REHAB POTENTIAL: Good  CLINICAL DECISION MAKING: Stable/uncomplicated  EVALUATION COMPLEXITY: Low  GOALS: Goals reviewed with patient? Yes  SHORT TERM GOALS=LONG TERM GOALS Target date: 07/27/24  Pt will be indepndent in self MLD for long term management of lymphedema.  Baseline: Goal status: IN PROGRESS 07/27/24  2.  Pt will obtain a compression bra for managmenet of lymphedema.  Baseline:  Goal status: 07/27/24- IN PROGRESS - pt has appt to be measured on 08/04/24  3.  Pt will report a 50% improvement in feeling of heaviness in R breast to allow improved comfort.  Baseline:  Goal status: 07/27/24 IN PROGRESS - 25% improvement  4.  Pt will be independent in a home  exercise program for continued stretching and strengtheing.  Baseline:  Goal status: 07/27/24- IN PROGRESS   PLAN:  PT FREQUENCY: 1x/week  PT DURATION: 4 weeks  PLANNED INTERVENTIONS: 97164- PT Re-evaluation, 97110-Therapeutic exercises, 97530- Therapeutic activity,  97112- Neuromuscular re-education, 97535- Self Care, 02859- Manual therapy, [phone]- Orthotic Initial, [phone]- Orthotic/Prosthetic subsequent, Patient/Family education, Balance training, Joint mobilization, Therapeutic exercises, Therapeutic activity, Neuromuscular re-education, Gait training, and Self Care  PLAN FOR NEXT SESSION: Cont to instruct pt in self MLD, did she get compression bra? (Appt is 10/31), give end range R shoulder stretching  [facility], PT 08/03/2024, 8:59 AM

## 2024-08-10 ENCOUNTER — Encounter: Payer: Self-pay | Admitting: Physical Therapy

## 2024-08-10 ENCOUNTER — Ambulatory Visit: Attending: Hematology and Oncology | Admitting: Physical Therapy

## 2024-08-10 DIAGNOSIS — Z170 Estrogen receptor positive status [ER+]: Secondary | ICD-10-CM | POA: Insufficient documentation

## 2024-08-10 DIAGNOSIS — I890 Lymphedema, not elsewhere classified: Secondary | ICD-10-CM | POA: Insufficient documentation

## 2024-08-10 DIAGNOSIS — M25611 Stiffness of right shoulder, not elsewhere classified: Secondary | ICD-10-CM | POA: Diagnosis present

## 2024-08-10 DIAGNOSIS — C50411 Malignant neoplasm of upper-outer quadrant of right female breast: Secondary | ICD-10-CM | POA: Insufficient documentation

## 2024-08-10 NOTE — Therapy (Signed)
 OUTPATIENT PHYSICAL THERAPY  UPPER EXTREMITY ONCOLOGY TREATMENT  Patient Name: Christie Molina MRN: [id number] DOB:1961-09-12, 63 y.o., female Today's Date: 08/10/2024  END OF SESSION:  PT End of Session - 08/10/24 1603     Visit Number 8    Number of Visits 10    Date for Recertification  08/24/24    Authorization Type none needed    PT Start Time 1602    PT Stop Time 1654    PT Time Calculation (min) 52 min    Activity Tolerance Patient tolerated treatment well    Behavior During Therapy WFL for tasks assessed/performed           Past Medical History:  Diagnosis Date   Breast cancer (HCC)    right breast IDC   GERD (gastroesophageal reflux disease)    Hyperlipidemia    Hypothyroidism    Thyroid disease    Vitamin D  deficiency    Past Surgical History:  Procedure Laterality Date   BREAST LUMPECTOMY WITH RADIOACTIVE SEED AND SENTINEL LYMPH NODE BIOPSY Right 10/13/2023   Procedure: RIGHT BREAST LUMPECTOMY WITH RADIOACTIVE SEED AND SENTINEL LYMPH NODE BIOPSY;  Surgeon: Curvin Deward MOULD, MD;  Location: [facility];  Service: General;  Laterality: Right;   COLONOSCOPY WITH PROPOFOL  N/A 07/24/2014   Procedure: COLONOSCOPY WITH PROPOFOL ;  Surgeon: Gladis MARLA Louder, MD;  Location: [facility];  Service: Endoscopy;  Laterality: N/A;   UPPER GASTROINTESTINAL ENDOSCOPY  08/2018   Patient Active Problem List   Diagnosis Date Noted   Genetic testing 10/01/2023   Malignant neoplasm of upper-outer quadrant of right breast in female, estrogen receptor positive (HCC) 09/20/2023   Hyperplastic polyps of stomach 06/05/2019   Anxiety state 07/06/2013   Hypothyroidism 06/29/2011   GE reflux 06/29/2011   Hyperlipidemia 06/29/2011    PCP: Ronal Hailstone, MD  REFERRING PROVIDER: Dr. Mackey Chad  REFERRING DIAG: [phone] (ICD-10-CM) - Malignant neoplasm of upper-outer quadrant of right breast in female, estrogen receptor positive (HCC)   THERAPY DIAG:  Lymphedema,  not elsewhere classified  Stiffness of right shoulder, not elsewhere classified  Malignant neoplasm of upper-outer quadrant of right breast in female, estrogen receptor positive (HCC)  ONSET DATE: 04/04/2024  Rationale for Evaluation and Treatment: Rehabilitation  SUBJECTIVE:                                                                                                                                                                                           SUBJECTIVE STATEMENT: I got the compression bra finally. I can tell a little difference in the swelling.   PERTINENT HISTORY:  Patient was diagnosed on 09/10/2023  with right grade 2 invasive ductal carcinoma breast cancer. It measures 8 mm and is located in the upper outer quadrant. It is ER/PR positive, HER2 neg with a Ki67 of 10%. 10/13/23- R breast lumpectomy and SLNB (0/5) Completed  radiation.   PAIN:  Are you having pain? No  PRECAUTIONS: Other: at risk of R lymphedema  RED FLAGS: None   WEIGHT BEARING RESTRICTIONS: No  FALLS:  Has patient fallen in last 6 months? 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  COGNITION: Overall cognitive status: Within functional limits for tasks assessed   PALPATION: Fibrosis present in inferior breast  OBSERVATIONS / OTHER ASSESSMENTS: breast is about 25% larger than L breast with increased pore size noted  POSTURE: forward head, rounded shoulders  UPPER EXTREMITY AROM/PROM: WFL but does still have tightness at end range on the R  UPPER EXTREMITY STRENGTH: 5/5  LYMPHEDEMA ASSESSMENTS:   SURGERY TYPE/DATE: R breast  lumpectomy on 10/13/23  NUMBER OF LYMPH NODES REMOVED: 0/5  CHEMOTHERAPY: did not require  RADIATION:completed  HORMONE TREATMENT: on anastrozole   INFECTIONS: none   LYMPHEDEMA ASSESSMENTS: recently had ldex screen which was normal   BREAST COMPLAINTS SURVEY: BREAST COMPLAINTS QUESTIONNAIRE Pain: 1 Heaviness: 4 Swollen feeling: 10 Tense Skin: 2 Redness: 1 Bra Print: 10 Size of Pores: 3 Hard feeling:  4 Total:     35/80 A Score over 9 indicates lymphedema issues in the breast                                                                                                                             TREATMENT DATE:  08/10/24:  MLD to L breast as follows: In supine: Short neck, 5 diaphragmatic breaths, L axillary nodes and establishment of interaxillary pathway, R inguinal nodes and establishment of axilloinguinal pathway, then R breast moving fluid towards pathways spending extra time in any areas of fibrosis then retracing all steps. Swelling has improved some today.   08/03/24:  MLD to L breast as follows: In supine: Short neck, 5 diaphragmatic breaths, L axillary nodes and establishment of interaxillary pathway, R inguinal nodes and establishment of axilloinguinal pathway, then R breast moving fluid towards pathways spending extra time in any areas of fibrosis then retracing all steps. Swelling has improved some today.   07/27/24:  MLD to L breast as follows: In supine: Short neck, 5 diaphragmatic breaths, L axillary nodes and establishment of interaxillary pathway, R inguinal nodes and establishment of axilloinguinal pathway, then R breast moving fluid towards pathways spending extra time in any areas of fibrosis then retracing all steps. Had pt demonstrate technique and she required less cues today. Educated  her that using a metronome might help with speed. Created a foam chip pack for pt to wear in her bra at lateral breast for additional compression.   07/24/24:  Had pt  demonstrate self MLD at beginning of session. She required v/c for speed and for sequence but demonstrated good skin stretch. Therapist took over after pt had demonstrate entire sequence as follows: In supine: Short neck, 5 diaphragmatic breaths, L axillary nodes and establishment of interaxillary pathway, R inguinal nodes and establishment of axilloinguinal pathway, then R breast moving fluid towards pathways spending extra time in any areas of fibrosis then retracing all steps.   07/13/24: Manual Therapy MLD in supine: Short neck, 5 diaphragmatic breaths, Lt axillary nodes and establishment of anterior interaxillary pathway, Rt inguinal nodes and establishment of Rt axilloinguinal pathway, then Rt superior and medial breast moving fluid towards anterior pathway, spending extra time in any areas of fibrosis, then into Lt S/L for focus to lateral and inferior breast redirecting towards lateral pathway, then finished then retracing all steps in supine having pt return demo assessing her pressure and technique. Min VC's for less pressure but overall pt doing well with this.  P/ROM during MLD into flex and abd with scapular depression throughout and educated her about incorporating end ROM stretches into her day STM to scar tissue in Rt axillary during P/ROM, this was mod tight and softened significantly by end of session, instructed pt in same  07/11/24: In supine: Short neck, 5 diaphragmatic breaths, L axillary nodes and establishment of interaxillary pathway, R inguinal nodes and establishment of axilloinguinal pathway, then R breast moving fluid towards pathways spending extra time in any areas of fibrosis then retracing all steps. Instructed pt throughout and had her return demonstrate each step while therapist provided appropriate v/c and t/c. Pt requiring less cueing today compared with last session.   07/04/24: In supine: Short neck, 5 diaphragmatic breaths, L axillary nodes and establishment of  interaxillary pathway, R inguinal nodes and establishment of axilloinguinal pathway, then R breast moving fluid towards pathways spending extra time in any areas of fibrosis then retracing all steps. Instructed pt throughout and had her return demonstrate each step while therapist provided appropriate v/c and t/c. Issued handout for pt to begin to practice self MLD at home.        PATIENT EDUCATION:  Education details: anatomy and physiology of the lymphatic system, need for compression bra and self MLD to manage Person educated: Patient Education method: Explanation Education comprehension: verbalized understanding  HOME EXERCISE PROGRAM: Obtain compression bra  ASSESSMENT:  CLINICAL IMPRESSION: Pt received her new compression bra and reports the swelling has improved some since she has been wearing it. She has been doing MLD daily. There is a decrease in edema at medial breast but still considerable fullness at lateral breast. Educated pt to wear chip pack in her compression bra in this area to increase compression.    OBJECTIVE IMPAIRMENTS: decreased knowledge of condition, decreased knowledge of use of DME, increased edema, increased fascial restrictions, and postural dysfunction.   ACTIVITY LIMITATIONS: none  PARTICIPATION LIMITATIONS: none  PERSONAL FACTORS: Time since onset of injury/illness/exacerbation are also affecting patient's functional outcome.   REHAB POTENTIAL: Good  CLINICAL DECISION MAKING: Stable/uncomplicated  EVALUATION COMPLEXITY: Low  GOALS: Goals reviewed with patient? Yes  SHORT TERM GOALS=LONG TERM GOALS Target date: 07/27/24  Pt will be indepndent in self MLD for long term management of lymphedema.  Baseline: Goal status: IN PROGRESS 07/27/24  2.  Pt will obtain a compression bra for managmenet of lymphedema.  Baseline:  Goal status: 07/27/24- IN PROGRESS - pt has appt to be measured on 08/04/24  3.  Pt will report a 50% improvement in  feeling of heaviness in R breast to allow improved comfort.  Baseline:  Goal status: 07/27/24 IN PROGRESS - 25% improvement  4.  Pt will be independent in a home exercise program for continued stretching and strengtheing.  Baseline:  Goal status: 07/27/24- IN PROGRESS   PLAN:  PT FREQUENCY: 1x/week  PT DURATION: 4 weeks  PLANNED INTERVENTIONS: 97164- PT Re-evaluation, 97110-Therapeutic exercises, 97530- Therapeutic activity, 97112- Neuromuscular re-education, 97535- Self Care, 02859- Manual therapy, [phone]- Orthotic Initial, [phone]- Orthotic/Prosthetic subsequent, Patient/Family education, Balance training, Joint mobilization, Therapeutic exercises, Therapeutic activity, Neuromuscular re-education, Gait training, and Self Care  PLAN FOR NEXT SESSION: Cont to instruct pt in self MLD, did she get compression bra? (Appt is 10/31), give end range R shoulder stretching  [facility], PT 08/10/2024, 4:57 PM

## 2024-08-15 ENCOUNTER — Encounter (HOSPITAL_BASED_OUTPATIENT_CLINIC_OR_DEPARTMENT_OTHER): Payer: Self-pay | Admitting: Obstetrics & Gynecology

## 2024-08-16 ENCOUNTER — Telehealth: Payer: Self-pay | Admitting: Adult Health

## 2024-08-16 ENCOUNTER — Ambulatory Visit (HOSPITAL_BASED_OUTPATIENT_CLINIC_OR_DEPARTMENT_OTHER): Payer: Self-pay | Admitting: Obstetrics & Gynecology

## 2024-08-16 ENCOUNTER — Encounter (HOSPITAL_BASED_OUTPATIENT_CLINIC_OR_DEPARTMENT_OTHER): Payer: Self-pay

## 2024-08-16 NOTE — Telephone Encounter (Signed)
 left vm for pt about scheduled telephone appt date and time. Encouraged to call back and reschedule if this appt is a conflict

## 2024-08-17 ENCOUNTER — Encounter: Payer: Self-pay | Admitting: Physical Therapy

## 2024-08-17 ENCOUNTER — Ambulatory Visit: Admitting: Physical Therapy

## 2024-08-17 DIAGNOSIS — M25611 Stiffness of right shoulder, not elsewhere classified: Secondary | ICD-10-CM

## 2024-08-17 DIAGNOSIS — I890 Lymphedema, not elsewhere classified: Secondary | ICD-10-CM | POA: Diagnosis not present

## 2024-08-17 DIAGNOSIS — Z170 Estrogen receptor positive status [ER+]: Secondary | ICD-10-CM

## 2024-08-17 NOTE — Therapy (Signed)
 OUTPATIENT PHYSICAL THERAPY  UPPER EXTREMITY ONCOLOGY TREATMENT  Patient Name: Christie Molina MRN: [id number] DOB:1961/07/21, 63 y.o., female Today's Date: 08/17/2024  END OF SESSION:  PT End of Session - 08/17/24 1606     Visit Number 9    Number of Visits 10    Date for Recertification  08/24/24    Authorization Type none needed    PT Start Time 1605    PT Stop Time 1657    PT Time Calculation (min) 52 min    Activity Tolerance Patient tolerated treatment well    Behavior During Therapy WFL for tasks assessed/performed           Past Medical History:  Diagnosis Date   Breast cancer (HCC)    right breast IDC   GERD (gastroesophageal reflux disease)    Hyperlipidemia    Hypothyroidism    Thyroid disease    Vitamin D  deficiency    Past Surgical History:  Procedure Laterality Date   BREAST LUMPECTOMY WITH RADIOACTIVE SEED AND SENTINEL LYMPH NODE BIOPSY Right 10/13/2023   Procedure: RIGHT BREAST LUMPECTOMY WITH RADIOACTIVE SEED AND SENTINEL LYMPH NODE BIOPSY;  Surgeon: Curvin Deward MOULD, MD;  Location: [facility];  Service: General;  Laterality: Right;   COLONOSCOPY WITH PROPOFOL  N/A 07/24/2014   Procedure: COLONOSCOPY WITH PROPOFOL ;  Surgeon: Gladis MARLA Louder, MD;  Location: [facility];  Service: Endoscopy;  Laterality: N/A;   UPPER GASTROINTESTINAL ENDOSCOPY  08/2018   Patient Active Problem List   Diagnosis Date Noted   Genetic testing 10/01/2023   Malignant neoplasm of upper-outer quadrant of right breast in female, estrogen receptor positive (HCC) 09/20/2023   Hyperplastic polyps of stomach 06/05/2019   Anxiety state 07/06/2013   Hypothyroidism 06/29/2011   GE reflux 06/29/2011   Hyperlipidemia 06/29/2011    PCP: Ronal Hailstone, MD  REFERRING PROVIDER: Dr. Mackey Chad  REFERRING DIAG: [phone] (ICD-10-CM) - Malignant neoplasm of upper-outer quadrant of right breast in female, estrogen receptor positive (HCC)   THERAPY DIAG:  Lymphedema,  not elsewhere classified  Stiffness of right shoulder, not elsewhere classified  Malignant neoplasm of upper-outer quadrant of right breast in female, estrogen receptor positive (HCC)  ONSET DATE: 04/04/2024  Rationale for Evaluation and Treatment: Rehabilitation  SUBJECTIVE:                                                                                                                                                                                           SUBJECTIVE STATEMENT: The swelling is doing better. I have been wearing the bra.   PERTINENT HISTORY:  Patient was diagnosed on 09/10/2023 with right grade 2  invasive ductal carcinoma breast cancer. It measures 8 mm and is located in the upper outer quadrant. It is ER/PR positive, HER2 neg with a Ki67 of 10%. 10/13/23- R breast lumpectomy and SLNB (0/5) Completed  radiation.   PAIN:  Are you having pain? No  PRECAUTIONS: Other: at risk of R lymphedema  RED FLAGS: None   WEIGHT BEARING RESTRICTIONS: No  FALLS:  Has patient fallen in last 6 months? 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  COGNITION: Overall cognitive status: Within functional limits for tasks assessed   PALPATION: Fibrosis present in inferior breast  OBSERVATIONS / OTHER ASSESSMENTS: breast is about 25% larger than L breast with increased pore size noted  POSTURE: forward head, rounded shoulders  UPPER EXTREMITY AROM/PROM: WFL but does still have tightness at end range on the R  UPPER EXTREMITY STRENGTH: 5/5  LYMPHEDEMA ASSESSMENTS:   SURGERY TYPE/DATE: R breast lumpectomy on 10/13/23  NUMBER  OF LYMPH NODES REMOVED: 0/5  CHEMOTHERAPY: did not require  RADIATION:completed  HORMONE TREATMENT: on anastrozole   INFECTIONS: none   LYMPHEDEMA ASSESSMENTS: recently had ldex screen which was normal   BREAST COMPLAINTS SURVEY: BREAST COMPLAINTS QUESTIONNAIRE Pain: 1 Heaviness: 4 Swollen feeling: 10 Tense Skin: 2 Redness: 1 Bra Print: 10 Size of Pores: 3 Hard feeling:  4 Total:     35/80 A Score over 9 indicates lymphedema issues in the breast                                                                                                                             TREATMENT DATE:  08/17/24:  MLD to L breast as follows: In supine: Short neck, 5 diaphragmatic breaths, L axillary nodes and establishment of interaxillary pathway, R inguinal nodes and establishment of axilloinguinal pathway, then R breast moving fluid towards pathways spending extra time in any areas of fibrosis then retracing all steps. Swelling improved some with increased skin wrinkling noted by end of session compared to beginning.   08/10/24:  MLD to L breast as follows: In supine: Short neck, 5 diaphragmatic breaths, L axillary nodes and establishment of interaxillary pathway, R inguinal nodes and establishment of axilloinguinal pathway, then R breast moving fluid towards pathways spending extra time in any areas of fibrosis then retracing all steps. Swelling has improved some today.   08/03/24:  MLD to L breast as follows: In supine: Short neck, 5 diaphragmatic breaths, L axillary nodes and establishment of interaxillary pathway, R inguinal nodes and establishment of axilloinguinal pathway, then R breast moving fluid towards pathways spending extra time in any areas of fibrosis then retracing all steps. Swelling has improved some today.  07/27/24:  MLD to L breast as follows: In supine: Short neck, 5 diaphragmatic breaths, L axillary nodes and establishment of interaxillary pathway, R inguinal nodes and  establishment of axilloinguinal pathway, then R breast moving fluid towards pathways spending extra time in any areas of fibrosis then retracing all steps. Had pt demonstrate technique and she required less cues today. Educated her that using a metronome might help with speed. Created a foam chip pack for pt to wear in her bra at lateral breast for additional compression.   07/24/24:  Had pt demonstrate self MLD at beginning of session. She required v/c for speed and for sequence but demonstrated good skin stretch. Therapist took over after pt had demonstrate entire sequence as follows: In supine: Short neck, 5 diaphragmatic breaths, L axillary nodes and establishment of interaxillary pathway, R inguinal nodes and establishment of axilloinguinal pathway, then R breast moving fluid towards pathways spending extra time in any areas of fibrosis then retracing all steps.   07/13/24: Manual Therapy MLD in supine: Short neck, 5 diaphragmatic breaths, Lt axillary nodes and establishment of anterior interaxillary pathway, Rt inguinal nodes and establishment of Rt axilloinguinal pathway, then Rt superior and medial breast moving fluid towards anterior pathway, spending extra time in any areas of fibrosis, then into Lt S/L for focus to lateral and inferior breast redirecting towards lateral pathway, then finished then retracing all steps in supine having pt return demo assessing her pressure and technique. Min VC's for less pressure but overall pt doing well with this.  P/ROM during MLD into flex and abd with scapular depression throughout and educated her about incorporating end ROM stretches into her day STM to scar tissue in Rt axillary during P/ROM, this was mod tight and softened significantly by end of session, instructed pt in same  07/11/24: In supine: Short neck, 5 diaphragmatic breaths, L axillary nodes and establishment of interaxillary pathway, R inguinal nodes and establishment of axilloinguinal pathway,  then R breast moving fluid towards pathways spending extra time in any areas of fibrosis then retracing all steps. Instructed pt throughout and had her return demonstrate each step while therapist provided appropriate v/c and t/c. Pt requiring less cueing today compared with last session.   07/04/24: In supine: Short neck, 5 diaphragmatic breaths, L axillary nodes and establishment of interaxillary pathway, R inguinal nodes and establishment of axilloinguinal pathway, then R breast moving fluid towards pathways spending extra time in any areas of fibrosis then retracing all steps. Instructed pt throughout and had her return demonstrate each step while therapist provided appropriate v/c and t/c. Issued handout for pt to begin to practice self MLD at home.        PATIENT EDUCATION:  Education details: anatomy and physiology of the lymphatic system, need for compression bra and self MLD to manage Person educated: Patient Education method: Explanation Education comprehension: verbalized understanding  HOME EXERCISE PROGRAM: Obtain compression bra  ASSESSMENT:  CLINICAL IMPRESSION: Pt has been wearing her new compression bras. She reports her swelling is getting better. She has been doing her self massage. Continued with MLD today with decrease in edema noted by end of session. Will plan for d/c next session.    OBJECTIVE IMPAIRMENTS: decreased knowledge of condition, decreased knowledge of use of DME, increased edema, increased fascial restrictions, and postural dysfunction.   ACTIVITY LIMITATIONS: none  PARTICIPATION LIMITATIONS: none  PERSONAL FACTORS: Time since onset of injury/illness/exacerbation are also affecting patient's functional outcome.   REHAB POTENTIAL: Good  CLINICAL DECISION MAKING:  Stable/uncomplicated  EVALUATION COMPLEXITY: Low  GOALS: Goals reviewed with patient? Yes  SHORT TERM GOALS=LONG TERM GOALS Target date: 07/27/24  Pt will be indepndent in self MLD  for long term management of lymphedema.  Baseline: Goal status: IN PROGRESS 07/27/24  2.  Pt will obtain a compression bra for managmenet of lymphedema.  Baseline:  Goal status: 07/27/24- IN PROGRESS - pt has appt to be measured on 08/04/24  3.  Pt will report a 50% improvement in feeling of heaviness in R breast to allow improved comfort.  Baseline:  Goal status: 07/27/24 IN PROGRESS - 25% improvement  4.  Pt will be independent in a home exercise program for continued stretching and strengtheing.  Baseline:  Goal status: 07/27/24- IN PROGRESS   PLAN:  PT FREQUENCY: 1x/week  PT DURATION: 4 weeks  PLANNED INTERVENTIONS: 97164- PT Re-evaluation, 97110-Therapeutic exercises, 97530- Therapeutic activity, 97112- Neuromuscular re-education, 97535- Self Care, [id number]- Manual therapy, [phone]- Orthotic Initial, [phone]- Orthotic/Prosthetic subsequent, Patient/Family education, Balance training, Joint mobilization, Therapeutic exercises, Therapeutic activity, Neuromuscular re-education, Gait training, and Self Care  PLAN FOR NEXT SESSION: d/c next session  Florina Lanis Carbon, PT 08/17/2024, 5:01 PM

## 2024-08-21 ENCOUNTER — Inpatient Hospital Stay: Attending: Hematology and Oncology | Admitting: Adult Health

## 2024-08-21 ENCOUNTER — Encounter: Payer: Self-pay | Admitting: Adult Health

## 2024-08-21 DIAGNOSIS — Z8249 Family history of ischemic heart disease and other diseases of the circulatory system: Secondary | ICD-10-CM | POA: Insufficient documentation

## 2024-08-21 DIAGNOSIS — C50411 Malignant neoplasm of upper-outer quadrant of right female breast: Secondary | ICD-10-CM | POA: Insufficient documentation

## 2024-08-21 DIAGNOSIS — Z79811 Long term (current) use of aromatase inhibitors: Secondary | ICD-10-CM | POA: Insufficient documentation

## 2024-08-21 DIAGNOSIS — M85851 Other specified disorders of bone density and structure, right thigh: Secondary | ICD-10-CM | POA: Insufficient documentation

## 2024-08-21 DIAGNOSIS — Z1721 Progesterone receptor positive status: Secondary | ICD-10-CM | POA: Insufficient documentation

## 2024-08-21 DIAGNOSIS — M810 Age-related osteoporosis without current pathological fracture: Secondary | ICD-10-CM | POA: Insufficient documentation

## 2024-08-21 DIAGNOSIS — Z170 Estrogen receptor positive status [ER+]: Secondary | ICD-10-CM | POA: Insufficient documentation

## 2024-08-21 DIAGNOSIS — Z79899 Other long term (current) drug therapy: Secondary | ICD-10-CM | POA: Insufficient documentation

## 2024-08-21 DIAGNOSIS — E039 Hypothyroidism, unspecified: Secondary | ICD-10-CM | POA: Insufficient documentation

## 2024-08-21 DIAGNOSIS — E785 Hyperlipidemia, unspecified: Secondary | ICD-10-CM | POA: Insufficient documentation

## 2024-08-21 DIAGNOSIS — K30 Functional dyspepsia: Secondary | ICD-10-CM | POA: Insufficient documentation

## 2024-08-21 DIAGNOSIS — E559 Vitamin D deficiency, unspecified: Secondary | ICD-10-CM

## 2024-08-21 DIAGNOSIS — Z1731 Human epidermal growth factor receptor 2 positive status: Secondary | ICD-10-CM | POA: Insufficient documentation

## 2024-08-21 DIAGNOSIS — Z860102 Personal history of hyperplastic colon polyps: Secondary | ICD-10-CM | POA: Insufficient documentation

## 2024-08-21 DIAGNOSIS — Z800 Family history of malignant neoplasm of digestive organs: Secondary | ICD-10-CM | POA: Insufficient documentation

## 2024-08-21 DIAGNOSIS — Z87891 Personal history of nicotine dependence: Secondary | ICD-10-CM | POA: Insufficient documentation

## 2024-08-21 DIAGNOSIS — Z803 Family history of malignant neoplasm of breast: Secondary | ICD-10-CM | POA: Insufficient documentation

## 2024-08-21 NOTE — Patient Instructions (Addendum)
 Zoledronic Acid Injection (Cancer) What is this medication? ZOLEDRONIC ACID (ZOE le dron ik AS id) treats high calcium levels in the blood caused by cancer. It may also be used with chemotherapy to treat weakened bones caused by cancer. It works by slowing down the release of calcium from bones. This lowers calcium levels in your blood. It also makes your bones stronger and less likely to break (fracture). It belongs to a group of medications called bisphosphonates. Consider every 6 months for two years then once a year.   This medicine may be used for other purposes; ask your health care provider or pharmacist if you have questions. COMMON BRAND NAME(S): Zometa, Zometa Powder What should I tell my care team before I take this medication? They need to know if you have any of these conditions: Dehydration Dental disease Kidney disease Liver disease Low levels of calcium in the blood Lung or breathing disease, such as asthma Receiving steroids, such as dexamethasone  or prednisone An unusual or allergic reaction to zoledronic acid, other medications, foods, dyes, or preservatives Pregnant or trying to get pregnant Breast-feeding How should I use this medication? This medication is injected into a vein. It is given by your care team in a hospital or clinic setting. Talk to your care team about the use of this medication in children. Special care may be needed. Overdosage: If you think you have taken too much of this medicine contact a poison control center or emergency room at once. NOTE: This medicine is only for you. Do not share this medicine with others. What if I miss a dose? Keep appointments for follow-up doses. It is important not to miss your dose. Call your care team if you are unable to keep an appointment. What may interact with this medication? Certain antibiotics given by injection Diuretics, such as bumetanide, furosemide NSAIDs, medications for pain and inflammation, such as  ibuprofen or naproxen Teriparatide Thalidomide This list may not describe all possible interactions. Give your health care provider a list of all the medicines, herbs, non-prescription drugs, or dietary supplements you use. Also tell them if you smoke, drink alcohol, or use illegal drugs. Some items may interact with your medicine. What should I watch for while using this medication? Visit your care team for regular checks on your progress. It may be some time before you see the benefit from this medication. Some people who take this medication have severe bone, joint, or muscle pain. This medication may also increase your risk for jaw problems or a broken thigh bone. Tell your care team right away if you have severe pain in your jaw, bones, joints, or muscles. Tell you care team if you have any pain that does not go away or that gets worse. Tell your dentist and dental surgeon that you are taking this medication. You should not have major dental surgery while on this medication. See your dentist to have a dental exam and fix any dental problems before starting this medication. Take good care of your teeth while on this medication. Make sure you see your dentist for regular follow-up appointments. You should make sure you get enough calcium and vitamin D  while you are taking this medication. Discuss the foods you eat and the vitamins you take with your care team. Check with your care team if you have severe diarrhea, nausea, and vomiting, or if you sweat a lot. The loss of too much body fluid may make it dangerous for you to take this medication. You  may need bloodwork while taking this medication. Talk to your care team if you wish to become pregnant or think you might be pregnant. This medication can cause serious birth defects. What side effects may I notice from receiving this medication? Side effects that you should report to your care team as soon as possible: Allergic reactions--skin rash,  itching, hives, swelling of the face, lips, tongue, or throat Kidney injury--decrease in the amount of urine, swelling of the ankles, hands, or feet Low calcium level--muscle pain or cramps, confusion, tingling, or numbness in the hands or feet Osteonecrosis of the jaw--pain, swelling, or redness in the mouth, numbness of the jaw, poor healing after dental work, unusual discharge from the mouth, visible bones in the mouth Severe bone, joint, or muscle pain Side effects that usually do not require medical attention (report to your care team if they continue or are bothersome): Constipation Fatigue Fever Loss of appetite Nausea Stomach pain This list may not describe all possible side effects. Call your doctor for medical advice about side effects. You may report side effects to FDA at 1-800-FDA-1088. Where should I keep my medication? This medication is given in a hospital or clinic. It will not be stored at home. NOTE: This sheet is a summary. It may not cover all possible information. If you have questions about this medicine, talk to your doctor, pharmacist, or health care provider.  2024 Elsevier/Gold Standard (2021-11-14 00:00:00)Bone Health Bones protect organs, store calcium, anchor muscles, and support the whole body. Keeping your bones strong is important, especially as you get older. You can take actions to help keep your bones strong and healthy. Why is keeping my bones healthy important?  Keeping your bones healthy is important because your body constantly replaces bone cells. Cells get old, and new cells take their place. As we age, we lose bone cells because the body may not be able to make enough new cells to replace the old cells. The amount of bone cells and bone tissue you have is referred to as bone mass. The higher your bone mass, the stronger your bones. The aging process leads to an overall loss of bone mass in the body, which can increase the likelihood of: Broken  bones. A condition in which the bones become weak and brittle (osteoporosis). A large decline in bone mass occurs in older adults. In women, it occurs about the time of menopause. What actions can I take to keep my bones healthy? Good health habits are important for maintaining healthy bones. This includes eating nutritious foods and exercising regularly. To have healthy bones, you need to get enough of the right minerals and vitamins. Most nutrition experts recommend getting these nutrients from the foods that you eat. In some cases, taking supplements may also be recommended. Doing certain types of exercise is also important for bone health. What are the nutritional recommendations for healthy bones?  Eating a well-balanced diet with plenty of calcium and vitamin D  will help to protect your bones. Nutritional recommendations vary from person to person. Ask your health care provider what is healthy for you. Here are some general guidelines. Get enough calcium Calcium is the most important (essential) mineral for bone health. Most people can get enough calcium from their diet, but supplements may be recommended for people who are at risk for osteoporosis. Good sources of calcium include: Dairy products, such as low-fat or nonfat milk, cheese, and yogurt. Dark green leafy vegetables, such as bok choy and broccoli. Foods that  have calcium added to them (are fortified). Foods that may be fortified with calcium include orange juice, cereal, bread, soy beverages, and tofu products. Nuts, such as almonds. Follow these recommended amounts for daily calcium intake: Infants, 0-6 months: 200 mg. Infants, 6-12 months: 260 mg. Children, age 71-3: 700 mg. Children, age 72-8: 1,000 mg. Children, age 729-13: 1,300 mg. Teens, age 721-18: 1,300 mg. Adults, age 32-50: 1,000 mg. Adults, age 23-70: Men: 1,000 mg. Women: 1,200 mg. Adults, age 21 or older: 1,200 mg. Pregnant and breastfeeding females: Teens: 1,300  mg. Adults: 1,000 mg. Get enough vitamin D  Vitamin D  is the most essential vitamin for bone health. It helps the body absorb calcium. Sunlight stimulates the skin to make vitamin D , so be sure to get enough sunlight. If you live in a cold climate or you do not get outside often, your health care provider may recommend that you take vitamin D  supplements. Good sources of vitamin D  in your diet include: Egg yolks. Saltwater fish. Milk and cereal fortified with vitamin D . Follow these recommended amounts for daily vitamin D  intake: Infants, 0-12 months: 400 international units (IU). Children and teens, age 71-18: 600 international units. Adults, age 727 or younger: 600 international units. Adults, age 58 or older: 600-1,000 international units. Get other important nutrients Other nutrients that are important for bone health include: Phosphorus. This mineral is found in meat, poultry, dairy foods, nuts, and legumes. The recommended daily intake for adult men and adult women is 700 mg. Magnesium. This mineral is found in seeds, nuts, dark green vegetables, and legumes. The recommended daily intake for adult men is 400-420 mg. For adult women, it is 310-320 mg. Vitamin K. This vitamin is found in green leafy vegetables. The recommended daily intake is 120 mcg for adult men and 90 mcg for adult women. What type of physical activity is best for building and maintaining healthy bones? Weight-bearing and strength-building activities are important for building and maintaining healthy bones. Weight-bearing activities cause muscles and bones to work against gravity. Strength-building activities increase the strength of the muscles that support bones. Weight-bearing and muscle-building activities include: Walking and hiking. Jogging and running. Dancing. Gym exercises. Lifting weights. Tennis and racquetball. Climbing stairs. Aerobics. Adults should get at least 30 minutes of moderate physical activity on  most days. Children should get at least 60 minutes of moderate physical activity on most days. Ask your health care provider what type of exercise is best for you. How can I find out if my bone mass is low? Bone mass can be measured with an X-ray test called a bone mineral density (BMD) test. This test is recommended for all women who are age 37 or older. It may also be recommended for: Men who are age 98 or older. People who are at risk for osteoporosis because of: Having a long-term disease that weakens bones, such as kidney disease or rheumatoid arthritis. Having menopause earlier than normal. Taking medicine that weakens bones, such as steroids, thyroid hormones, or hormone treatment for breast cancer or prostate cancer. Smoking. Drinking three or more alcoholic drinks a day. Being underweight. Sedentary lifestyle. If you find that you have a low bone mass, you may be able to prevent osteoporosis or further bone loss by changing your diet and lifestyle. Where can I find more information? Bone Health & Osteoporosis Foundation: https://carlson-fletcher.info/ [employer]: www.bones.http://www.myers.net/ International Osteoporosis Foundation: investment banker, operational.iofbonehealth.org Summary The aging process leads to an overall loss of bone mass in the body,  which can increase the likelihood of broken bones and osteoporosis. Eating a well-balanced diet with plenty of calcium and vitamin D  will help to protect your bones. Weight-bearing and strength-building activities are also important for building and maintaining strong bones. Bone mass can be measured with an X-ray test called a bone mineral density (BMD) test. This information is not intended to replace advice given to you by your health care provider. Make sure you discuss any questions you have with your health care provider. Document Revised: 03/05/2021 Document Reviewed: 03/05/2021 Elsevier Patient Education  2024 [employer].

## 2024-08-21 NOTE — Progress Notes (Signed)
 [facility] Cancer Follow up:    Perri Ronal PARAS, MD [address] [place] [address]   DIAGNOSIS: Cancer Staging  Malignant neoplasm of upper-outer quadrant of right breast in female, estrogen receptor positive (HCC) Staging form: Breast, AJCC 8th Edition - Clinical stage from 09/21/2023: Stage IA (cT1b, cN0, cM0, G2, ER+, PR+, HER2+) - Signed by Lanell Donald Stagger, PA-C on 09/21/2023 Stage prefix: Initial diagnosis Method of lymph node assessment: Clinical Histologic grading system: 3 grade system - Pathologic stage from 11/14/2023: Stage IA (pT1c, pN0(sn), cM0, G3, ER+, PR+, HER2-, Oncotype DX score: 21) - Signed by Lanell Donald Stagger, PA-C on 11/14/2023 Stage prefix: Initial diagnosis Method of lymph node assessment: Sentinel lymph node biopsy Multigene prognostic tests performed: Oncotype DX Recurrence score range: Greater than or equal to 11 Histologic grading system: 3 grade system  I connected with Zaniah D Harvel on 08/21/24 at 10:00 AM EST by telephone and verified that I am speaking with the correct person using two identifiers. I discussed the limitations, risks, security and privacy concerns of performing an evaluation and management service by telephone and the availability of in person appointments. I also discussed with the patient that there may be a patient responsible charge related to this service. The patient expressed understanding and agreed to proceed.   Patient location: home Provider location:  [facility] office Others participating in call: none   SUMMARY OF ONCOLOGIC HISTORY: Oncology History  Malignant neoplasm of upper-outer quadrant of right breast in female, estrogen receptor positive (HCC)  09/10/2023 Initial Diagnosis   Screening mammogram detected right breast mass UOQ: 0.8 cm at 10:00, axilla negative, biopsy: Grade 2 IDC ER 100%, PR 80%, Ki67 10%, HER2 positive by FISH ratio 3.26 copy #7    09/21/2023 Cancer Staging    Staging form: Breast, AJCC 8th Edition - Clinical stage from 09/21/2023: Stage IA (cT1b, cN0, cM0, G2, ER+, PR+, HER2+) - Signed by Lanell Donald Stagger, PA-C on 09/21/2023 Stage prefix: Initial diagnosis Method of lymph node assessment: Clinical Histologic grading system: 3 grade system   10/13/2023 Surgery   Right lumpectomy: Invasive poorly differentiated adenocarcinoma grade 3, DCIS intermediate grade, 1.7 cm, margins negative, negative for angiolymphatic invasion, 0/5 lymph nodes negative, ER 100%, PR 80%, HER2 positive, Ki-67 10%   10/20/2023 Genetic Testing   Negative [facility] Cancer[employer] Panel.  The report date is 10/20/2023.   The [facility] Cancer[employer] Panel includes sequencing and rearrangement analysis for the following 39 genes: APC, ATM, BAP1, BARD1, BMPR1A, BRCA1, BRCA2, BRIP1, CDH1, CDKN2A, CHEK2, FH, FLCN, MET, MLH1, MSH2, MSH6, MUTYH, NF1, NTHL1, PALB2, PMS2, PTEN, RAD51C, RAD51D, SMAD4, STK11, TP53, TSC1, TSC2, and VHL (sequencing and deletion/duplication); AXIN2, HOXB13, MBD4, MSH3, POLD1 and POLE (sequencing only); EPCAM and GREM1 (deletion/duplication only).  Of note, RNA analysis was unable to be completed due to insufficient sample quality.    11/14/2023 Cancer Staging   Staging form: Breast, AJCC 8th Edition - Pathologic stage from 11/14/2023: Stage IA (pT1c, pN0(sn), cM0, G3, ER+, PR+, HER2-, Oncotype DX score: 21) - Signed by Lanell Donald Stagger, PA-C on 11/14/2023 Stage prefix: Initial diagnosis Method of lymph node assessment: Sentinel lymph node biopsy Multigene prognostic tests performed: Oncotype DX Recurrence score range: Greater than or equal to 11 Histologic grading system: 3 grade system   12/16/2023 - 01/12/2024 Radiation Therapy   Plan Name: Breast_R Site: Breast, Right Technique: 3D Mode: Photon Dose Per Fraction: 2.66 Gy Prescribed Dose (Delivered / Prescribed): 42.56 Gy / 42.56 Gy Prescribed Fxs (Delivered /  Prescribed): 16 / 16   Plan Name: Breast_Rt_Bst Site:  Breast, Right Technique: 3D Mode: Photon Dose Per Fraction: 2 Gy Prescribed Dose (Delivered / Prescribed): 8 Gy / 8 Gy Prescribed Fxs (Delivered / Prescribed): 4 / 4     03/2024 -  Anti-estrogen oral therapy   Anastrozole  x 7 years     CURRENT THERAPY: Anastrozole    INTERVAL HISTORY:  Discussed the use of AI scribe software for clinical note transcription with the patient, who gave verbal consent to proceed.  History of Present Illness      Patient Active Problem List   Diagnosis Date Noted   Genetic testing 10/01/2023   Malignant neoplasm of upper-outer quadrant of right breast in female, estrogen receptor positive (HCC) 09/20/2023   Hyperplastic polyps of stomach 06/05/2019   Anxiety state 07/06/2013   Hypothyroidism 06/29/2011   GE reflux 06/29/2011   Hyperlipidemia 06/29/2011    has no known allergies.  MEDICAL HISTORY: Past Medical History:  Diagnosis Date   Breast cancer (HCC)    right breast IDC   GERD (gastroesophageal reflux disease)    Hyperlipidemia    Hypothyroidism    Thyroid disease    Vitamin D  deficiency     SURGICAL HISTORY: Past Surgical History:  Procedure Laterality Date   BREAST LUMPECTOMY WITH RADIOACTIVE SEED AND SENTINEL LYMPH NODE BIOPSY Right 10/13/2023   Procedure: RIGHT BREAST LUMPECTOMY WITH RADIOACTIVE SEED AND SENTINEL LYMPH NODE BIOPSY;  Surgeon: Curvin Deward MOULD, MD;  Location: [facility];  Service: General;  Laterality: Right;   COLONOSCOPY WITH PROPOFOL  N/A 07/24/2014   Procedure: COLONOSCOPY WITH PROPOFOL ;  Surgeon: Gladis MARLA Louder, MD;  Location: [facility];  Service: Endoscopy;  Laterality: N/A;   UPPER GASTROINTESTINAL ENDOSCOPY  08/2018    SOCIAL HISTORY: Social History   Socioeconomic History   Marital status: Married    Spouse name: Not on file   Number of children: 2   Years of education: Not on file   Highest education level: Not on file  Occupational History   Not on file  Tobacco Use    Smoking status: Former    Current packs/day: 0.00    Average packs/day: 0.1 packs/day for 4.0 years (0.4 ttl pk-yrs)    Types: Cigarettes    Start date: 05/05/1982    Quit date: 05/05/1986    Years since quitting: 38.3   Smokeless tobacco: Never  Vaping Use   Vaping status: Never Used  Substance and Sexual Activity   Alcohol use: Yes    Alcohol/week: 0.0 - 1.0 standard drinks of alcohol    Comment: social   Drug use: No   Sexual activity: Yes    Partners: Male    Birth control/protection: Post-menopausal  Other Topics Concern   Not on file  Social History Narrative   Not on file   Social Drivers of Health   Financial Resource Strain: Not on file  Food Insecurity: No Food Insecurity (10/11/2023)   Hunger Vital Sign    Worried About Running Out of Food in the Last Year: Never true    Ran Out of Food in the Last Year: Never true  Transportation Needs: No Transportation Needs (10/11/2023)   PRAPARE - Administrator, Civil Service (Medical): No    Lack of Transportation (Non-Medical): No  Physical Activity: Not on file  Stress: Not on file  Social Connections: Not on file  Intimate Partner Violence: Not At Risk (10/11/2023)   Humiliation, Afraid, Rape, and Kick  questionnaire    Fear of Current or Ex-Partner: No    Emotionally Abused: No    Physically Abused: No    Sexually Abused: No    FAMILY HISTORY: Family History  Problem Relation Age of Onset   Heart disease Father    Pancreatic cancer Maternal Aunt 32   Breast cancer Paternal Aunt 69 - 37   Breast cancer Cousin        maternal first cousin    Review of Systems - Oncology    PHYSICAL EXAMINATION    There were no vitals filed for this visit.  Physical Exam  LABORATORY DATA:  CBC    Component Value Date/Time   WBC 3.3 (L) 07/03/2024 0910   RBC 4.33 07/03/2024 0910   HGB 13.4 07/03/2024 0910   HGB 13.5 09/22/2023 1242   HCT 40.6 07/03/2024 0910   PLT 202 07/03/2024 0910   PLT 227 09/22/2023  1242   MCV 93.8 07/03/2024 0910   MCH 30.9 07/03/2024 0910   MCHC 33.0 07/03/2024 0910   RDW 12.2 07/03/2024 0910   LYMPHSABS 1.3 09/22/2023 1242   MONOABS 0.4 09/22/2023 1242   EOSABS 79 07/03/2024 0910   BASOSABS 50 07/03/2024 0910    CMP     Component Value Date/Time   NA 139 07/03/2024 0910   K 4.5 07/03/2024 0910   CL 102 07/03/2024 0910   CO2 29 07/03/2024 0910   GLUCOSE 99 07/03/2024 0910   BUN 9 07/03/2024 0910   CREATININE 0.79 07/03/2024 0910   CALCIUM 9.5 07/03/2024 0910   PROT 6.6 07/03/2024 0910   ALBUMIN 4.2 09/22/2023 1242   AST 18 07/03/2024 0910   AST 19 09/22/2023 1242   ALT 14 07/03/2024 0910   ALT 17 09/22/2023 1242   ALKPHOS 59 09/22/2023 1242   BILITOT 0.6 07/03/2024 0910   BILITOT 0.4 09/22/2023 1242   GFRNONAA >60 09/22/2023 1242   GFRNONAA 74 06/17/2020 0925   GFRAA 86 06/17/2020 0925     ASSESSMENT and THERAPY PLAN:   Malignant neoplasm of upper-outer quadrant of right breast in female, estrogen receptor positive (HCC) 09/10/2023: Screening mammogram detected right breast mass UOQ: 0.8 cm at 10:00, axilla negative, biopsy: Grade 2 IDC ER 100%, PR 80%, Ki67 10%, HER2 positive by FISH ratio 3.26 copy #7    10/13/2023: Right lumpectomy: Invasive poorly differentiated adenocarcinoma grade 3, DCIS intermediate grade, 1.7 cm, margins negative, negative for angiolymphatic invasion, 0/5 lymph nodes negative, ER 100%, PR 80%, HER2 positive on biopsy, Ki-67 10% Repeat breast prognostic panel: ER 95%, PR 60%, Ki67 10%, HER2 1+ negative Oncotype DX recurrence score: 21 (7% risk of distant recurrence at 9 years)   Treatment plan: Adjuvant radiation 12/17/2023-01/12/2024 Adjuvant antiestrogen therapy anastrozole  started 03/09/2024 (patient will start taking every other day and see how she tolerates it and then decide) ------------------------------------------------------------------------------------------------------------------- Anastrozole   toxicities:  Breast cancer surveillance: Mammogram has been ordered. Return to clinic in 1 year for follow-up   Assessment and Plan Assessment & Plan        The patient was provided an opportunity to ask questions and all were answered. The patient agreed with the plan and demonstrated an understanding of the instructions.   The patient was advised to call back or seek an in-person evaluation if the symptoms worsen or if the condition fails to improve as anticipated.   I provided *** minutes of {Blank single:19197::face-to-face video visit time,non face-to-face telephone visit time} during this encounter, and > 50% was spent counseling  as documented under my assessment & plan.  Morna Kendall, NP 08/21/24 10:23 AM Medical Oncology and Hematology [facility] [address] [place], [place] [address] Tel. [phone]    Fax. [phone]  *Total Encounter Time as defined by the Centers for Medicare and Medicaid Services includes, in addition to the face-to-face time of a patient visit (documented in the note above) non-face-to-face time: obtaining and reviewing outside history, ordering and reviewing medications, tests or procedures, care coordination (communications with other health care professionals or caregivers) and documentation in the medical record.

## 2024-08-21 NOTE — Assessment & Plan Note (Signed)
 09/10/2023: Screening mammogram detected right breast mass UOQ: 0.8 cm at 10:00, axilla negative, biopsy: Grade 2 IDC ER 100%, PR 80%, Ki67 10%, HER2 positive by FISH ratio 3.26 copy #7    10/13/2023: Right lumpectomy: Invasive poorly differentiated adenocarcinoma grade 3, DCIS intermediate grade, 1.7 cm, margins negative, negative for angiolymphatic invasion, 0/5 lymph nodes negative, ER 100%, PR 80%, HER2 positive on biopsy, Ki-67 10% Repeat breast prognostic panel: ER 95%, PR 60%, Ki67 10%, HER2 1+ negative Oncotype DX recurrence score: 21 (7% risk of distant recurrence at 9 years)   Treatment plan: Adjuvant radiation 12/17/2023-01/12/2024 Adjuvant antiestrogen therapy anastrozole  started 03/09/2024 (patient will start taking every other day and see how she tolerates it and then decide) ------------------------------------------------------------------------------------------------------------------- Anastrozole  toxicities:  Breast cancer surveillance: Mammogram has been ordered. Return to clinic in 1 year for follow-up

## 2024-08-22 ENCOUNTER — Ambulatory Visit: Admitting: Physical Therapy

## 2024-08-22 ENCOUNTER — Encounter: Payer: Self-pay | Admitting: Physical Therapy

## 2024-08-22 DIAGNOSIS — I890 Lymphedema, not elsewhere classified: Secondary | ICD-10-CM

## 2024-08-22 DIAGNOSIS — M25611 Stiffness of right shoulder, not elsewhere classified: Secondary | ICD-10-CM

## 2024-08-22 DIAGNOSIS — Z170 Estrogen receptor positive status [ER+]: Secondary | ICD-10-CM

## 2024-08-22 NOTE — Therapy (Signed)
 OUTPATIENT PHYSICAL THERAPY  UPPER EXTREMITY ONCOLOGY TREATMENT  Patient Name: Christie Molina MRN: [id number] DOB:1961/07/07, 63 y.o., female Today's Date: 08/22/2024  END OF SESSION:  PT End of Session - 08/22/24 1603     Visit Number 10    Number of Visits 10    Date for Recertification  08/24/24    Authorization Type none needed    PT Start Time 1602    PT Stop Time 1653    PT Time Calculation (min) 51 min    Activity Tolerance Patient tolerated treatment well    Behavior During Therapy WFL for tasks assessed/performed           Past Medical History:  Diagnosis Date   Breast cancer (HCC)    right breast IDC   GERD (gastroesophageal reflux disease)    Hyperlipidemia    Hypothyroidism    Thyroid disease    Vitamin D  deficiency    Past Surgical History:  Procedure Laterality Date   BREAST LUMPECTOMY WITH RADIOACTIVE SEED AND SENTINEL LYMPH NODE BIOPSY Right 10/13/2023   Procedure: RIGHT BREAST LUMPECTOMY WITH RADIOACTIVE SEED AND SENTINEL LYMPH NODE BIOPSY;  Surgeon: Curvin Deward MOULD, MD;  Location: [facility];  Service: General;  Laterality: Right;   COLONOSCOPY WITH PROPOFOL  N/A 07/24/2014   Procedure: COLONOSCOPY WITH PROPOFOL ;  Surgeon: Gladis MARLA Louder, MD;  Location: [facility];  Service: Endoscopy;  Laterality: N/A;   UPPER GASTROINTESTINAL ENDOSCOPY  08/2018   Patient Active Problem List   Diagnosis Date Noted   Genetic testing 10/01/2023   Malignant neoplasm of upper-outer quadrant of right breast in female, estrogen receptor positive (HCC) 09/20/2023   Hyperplastic polyps of stomach 06/05/2019   Anxiety state 07/06/2013   Hypothyroidism 06/29/2011   GE reflux 06/29/2011   Hyperlipidemia 06/29/2011    PCP: Ronal Hailstone, MD  REFERRING PROVIDER: Dr. Mackey Chad  REFERRING DIAG: [phone] (ICD-10-CM) - Malignant neoplasm of upper-outer quadrant of right breast in female, estrogen receptor positive (HCC)   THERAPY DIAG:  Lymphedema,  not elsewhere classified  Stiffness of right shoulder, not elsewhere classified  Malignant neoplasm of upper-outer quadrant of right breast in female, estrogen receptor positive (HCC)  ONSET DATE: 04/04/2024  Rationale for Evaluation and Treatment: Rehabilitation  SUBJECTIVE:                                                                                                                                                                                           SUBJECTIVE STATEMENT: I ended up not needing any extra info to get the bras.   PERTINENT HISTORY:  Patient was diagnosed on 09/10/2023 with right grade  2 invasive ductal carcinoma breast cancer. It measures 8 mm and is located in the upper outer quadrant. It is ER/PR positive, HER2 neg with a Ki67 of 10%. 10/13/23- R breast lumpectomy and SLNB (0/5) Completed  radiation.   PAIN:  Are you having pain? No  PRECAUTIONS: Other: at risk of R lymphedema  RED FLAGS: None   WEIGHT BEARING RESTRICTIONS: No  FALLS:  Has patient fallen in last 6 months? 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  COGNITION: Overall cognitive status: Within functional limits for tasks assessed   PALPATION: Fibrosis present in inferior breast  OBSERVATIONS / OTHER ASSESSMENTS: breast is about 25% larger than L breast with increased pore size noted  POSTURE: forward head, rounded shoulders  UPPER EXTREMITY AROM/PROM: WFL but does still have tightness at end range on the R  UPPER EXTREMITY STRENGTH: 5/5  LYMPHEDEMA ASSESSMENTS:   SURGERY TYPE/DATE: R breast lumpectomy on 10/13/23  NUMBER OF  LYMPH NODES REMOVED: 0/5  CHEMOTHERAPY: did not require  RADIATION:completed  HORMONE TREATMENT: on anastrozole   INFECTIONS: none   LYMPHEDEMA ASSESSMENTS: recently had ldex screen which was normal   BREAST COMPLAINTS SURVEY: BREAST COMPLAINTS QUESTIONNAIRE Pain: 1 Heaviness: 4 Swollen feeling: 10 Tense Skin: 2 Redness: 1 Bra Print: 10 Size of Pores: 3 Hard feeling:  4 Total:     35/80 A Score over 9 indicates lymphedema issues in the breast                                                                                                                             TREATMENT DATE:  08/22/24:  MLD to L breast as follows: In supine: Short neck, 5 diaphragmatic breaths, L axillary nodes and establishment of interaxillary pathway, R inguinal nodes and establishment of axilloinguinal pathway, then R breast moving fluid towards pathways spending extra time in any areas of fibrosis then retracing all steps.   08/17/24:  MLD to L breast as follows: In supine: Short neck, 5 diaphragmatic breaths, L axillary nodes and establishment of interaxillary pathway, R inguinal nodes and establishment of axilloinguinal pathway, then R breast moving fluid towards pathways spending extra time in any areas of fibrosis then retracing all steps. Swelling improved some with increased skin wrinkling noted by end of session compared to beginning.   08/10/24:  MLD to L breast as follows: In supine: Short neck, 5 diaphragmatic breaths, L axillary nodes and establishment of interaxillary pathway, R inguinal nodes and establishment of axilloinguinal pathway, then R breast moving fluid towards pathways spending extra time in any areas of fibrosis then retracing all steps. Swelling has improved some today.   08/03/24:  MLD to L breast as follows: In supine: Short neck, 5 diaphragmatic breaths, L axillary nodes and establishment of interaxillary pathway, R inguinal nodes and establishment of axilloinguinal pathway,  then R breast moving fluid towards pathways spending extra time in any areas of fibrosis then retracing all steps. Swelling has improved some today.   07/27/24:  MLD to L breast as follows: In supine: Short neck, 5 diaphragmatic breaths, L axillary nodes and establishment of interaxillary pathway, R inguinal nodes and establishment of axilloinguinal pathway, then R breast moving fluid towards pathways spending extra time in any areas of fibrosis then retracing all steps. Had pt demonstrate technique and she required less cues today. Educated her that using a metronome might help with speed. Created a foam chip pack for pt to wear in her bra at lateral breast for additional compression.   07/24/24:  Had pt demonstrate self MLD at beginning of session. She required v/c for speed and for sequence but demonstrated good skin stretch. Therapist took over after pt had demonstrate entire sequence as follows: In supine: Short neck, 5 diaphragmatic breaths, L axillary nodes and establishment of interaxillary pathway, R inguinal nodes and establishment of axilloinguinal pathway, then R breast moving fluid towards pathways spending extra time in any areas of fibrosis then retracing all steps.   07/13/24: Manual Therapy MLD in supine: Short neck, 5 diaphragmatic breaths, Lt axillary nodes and establishment of anterior interaxillary pathway, Rt inguinal nodes and establishment of Rt axilloinguinal pathway, then Rt superior and medial breast moving fluid towards anterior pathway, spending extra time in any areas of fibrosis, then into Lt S/L for focus to lateral and inferior breast redirecting towards lateral pathway, then finished then retracing all steps in supine having pt return demo assessing her pressure and technique. Min VC's for less pressure but overall pt doing well with this.  P/ROM during MLD into flex and abd with scapular depression throughout and educated her about incorporating end ROM stretches into her  day STM to scar tissue in Rt axillary during P/ROM, this was mod tight and softened significantly by end of session, instructed pt in same  07/11/24: In supine: Short neck, 5 diaphragmatic breaths, L axillary nodes and establishment of interaxillary pathway, R inguinal nodes and establishment of axilloinguinal pathway, then R breast moving fluid towards pathways spending extra time in any areas of fibrosis then retracing all steps. Instructed pt throughout and had her return demonstrate each step while therapist provided appropriate v/c and t/c. Pt requiring less cueing today compared with last session.   07/04/24: In supine: Short neck, 5 diaphragmatic breaths, L axillary nodes and establishment of interaxillary pathway, R inguinal nodes and establishment of axilloinguinal pathway, then R breast moving fluid towards pathways spending extra time in any areas of fibrosis then retracing all steps. Instructed pt throughout and had her return demonstrate each step while therapist provided appropriate v/c and t/c. Issued handout for pt to begin to practice self MLD at home.        PATIENT EDUCATION:  Education details: anatomy and physiology of the lymphatic system, need for compression bra and self MLD to manage Person educated: Patient Education method: Explanation Education comprehension: verbalized understanding  HOME EXERCISE PROGRAM: Obtain compression bra  ASSESSMENT:  CLINICAL IMPRESSION: Pt has now met all goals for therapy and is able to independently manage her lymphedema at home through self MLD and a compression bra. She will be discharged from skilled PT services at this time.    OBJECTIVE IMPAIRMENTS:  decreased knowledge of condition, decreased knowledge of use of DME, increased edema, increased fascial restrictions, and postural dysfunction.   ACTIVITY LIMITATIONS: none  PARTICIPATION LIMITATIONS: none  PERSONAL FACTORS: Time since onset of injury/illness/exacerbation are  also affecting patient's functional outcome.   REHAB POTENTIAL: Good  CLINICAL DECISION MAKING: Stable/uncomplicated  EVALUATION COMPLEXITY: Low  GOALS: Goals reviewed with patient? Yes  SHORT TERM GOALS=LONG TERM GOALS Target date: 07/27/24  Pt will be indepndent in self MLD for long term management of lymphedema.  Baseline: Goal status: MET 08/22/24  2.  Pt will obtain a compression bra for managmenet of lymphedema.  Baseline:  Goal status: MET 08/22/24  3.  Pt will report a 50% improvement in feeling of heaviness in R breast to allow improved comfort.  Baseline:  Goal status: 08/22/24  MET 50% or more improved  4.  Pt will be independent in a home exercise program for continued stretching and strengtheing.  Baseline:  Goal status: MET 08/22/24   PLAN:  PT FREQUENCY: 1x/week  PT DURATION: 4 weeks  PLANNED INTERVENTIONS: 97164- PT Re-evaluation, 97110-Therapeutic exercises, 97530- Therapeutic activity, 97112- Neuromuscular re-education, 97535- Self Care, 02859- Manual therapy, [phone]- Orthotic Initial, [phone]- Orthotic/Prosthetic subsequent, Patient/Family education, Balance training, Joint mobilization, Therapeutic exercises, Therapeutic activity, Neuromuscular re-education, Gait training, and Self Care  PLAN FOR NEXT SESSION: d/c   Florina Lanis Carbon, PT 08/22/2024, 4:55 PM  PHYSICAL THERAPY DISCHARGE SUMMARY  Visits from Start of Care: 10  Current functional level related to goals / functional outcomes: All goals met   Remaining deficits: None   Education / Equipment: HEP, MLD, compression bra   Patient agrees to discharge. Patient goals were met. Patient is being discharged due to meeting the stated rehab goals.  Florina Lanis [place], [place] 08/22/24 4:57 PM

## 2024-08-28 ENCOUNTER — Other Ambulatory Visit: Payer: Self-pay

## 2024-08-28 DIAGNOSIS — C50411 Malignant neoplasm of upper-outer quadrant of right female breast: Secondary | ICD-10-CM

## 2024-08-29 ENCOUNTER — Inpatient Hospital Stay

## 2024-08-29 DIAGNOSIS — C50411 Malignant neoplasm of upper-outer quadrant of right female breast: Secondary | ICD-10-CM

## 2024-08-29 DIAGNOSIS — Z8249 Family history of ischemic heart disease and other diseases of the circulatory system: Secondary | ICD-10-CM | POA: Diagnosis not present

## 2024-08-29 DIAGNOSIS — Z87891 Personal history of nicotine dependence: Secondary | ICD-10-CM | POA: Diagnosis not present

## 2024-08-29 DIAGNOSIS — Z79811 Long term (current) use of aromatase inhibitors: Secondary | ICD-10-CM | POA: Diagnosis not present

## 2024-08-29 DIAGNOSIS — K30 Functional dyspepsia: Secondary | ICD-10-CM | POA: Diagnosis not present

## 2024-08-29 DIAGNOSIS — Z1731 Human epidermal growth factor receptor 2 positive status: Secondary | ICD-10-CM | POA: Diagnosis not present

## 2024-08-29 DIAGNOSIS — Z860102 Personal history of hyperplastic colon polyps: Secondary | ICD-10-CM | POA: Diagnosis not present

## 2024-08-29 DIAGNOSIS — Z803 Family history of malignant neoplasm of breast: Secondary | ICD-10-CM | POA: Diagnosis not present

## 2024-08-29 DIAGNOSIS — Z170 Estrogen receptor positive status [ER+]: Secondary | ICD-10-CM | POA: Diagnosis not present

## 2024-08-29 DIAGNOSIS — M810 Age-related osteoporosis without current pathological fracture: Secondary | ICD-10-CM | POA: Diagnosis not present

## 2024-08-29 DIAGNOSIS — Z800 Family history of malignant neoplasm of digestive organs: Secondary | ICD-10-CM | POA: Diagnosis not present

## 2024-08-29 DIAGNOSIS — M85851 Other specified disorders of bone density and structure, right thigh: Secondary | ICD-10-CM | POA: Diagnosis not present

## 2024-08-29 DIAGNOSIS — E785 Hyperlipidemia, unspecified: Secondary | ICD-10-CM | POA: Diagnosis not present

## 2024-08-29 DIAGNOSIS — Z79899 Other long term (current) drug therapy: Secondary | ICD-10-CM | POA: Diagnosis not present

## 2024-08-29 DIAGNOSIS — E039 Hypothyroidism, unspecified: Secondary | ICD-10-CM | POA: Diagnosis not present

## 2024-08-29 DIAGNOSIS — Z1721 Progesterone receptor positive status: Secondary | ICD-10-CM | POA: Diagnosis not present

## 2024-08-29 LAB — CMP (CANCER CENTER ONLY)
ALT: 20 U/L (ref 0–44)
AST: 28 U/L (ref 15–41)
Albumin: 4.5 g/dL (ref 3.5–5.0)
Alkaline Phosphatase: 67 U/L (ref 38–126)
Anion gap: 9 (ref 5–15)
BUN: 11 mg/dL (ref 8–23)
CO2: 28 mmol/L (ref 22–32)
Calcium: 9.6 mg/dL (ref 8.9–10.3)
Chloride: 104 mmol/L (ref 98–111)
Creatinine: 0.83 mg/dL (ref 0.44–1.00)
GFR, Estimated: >60 mL/min (ref >60)
Glucose, Bld: 101 mg/dL — ABNORMAL HIGH (ref 70–99)
Potassium: 4.1 mmol/L (ref 3.5–5.1)
Sodium: 140 mmol/L (ref 135–145)
Total Bilirubin: 0.4 mg/dL (ref 0.0–1.2)
Total Protein: 7.1 g/dL (ref 6.5–8.1)

## 2024-08-29 LAB — CBC WITH DIFFERENTIAL (CANCER CENTER ONLY)
Abs Immature Granulocytes: 0.01 K/uL (ref 0.00–0.07)
Basophils Absolute: 0.1 K/uL (ref 0.0–0.1)
Basophils Relative: 1 %
Eosinophils Absolute: 0.1 K/uL (ref 0.0–0.5)
Eosinophils Relative: 2 %
HCT: 40.9 % (ref 36.0–46.0)
Hemoglobin: 13.7 g/dL (ref 12.0–15.0)
Immature Granulocytes: 0 %
Lymphocytes Relative: 32 %
Lymphs Abs: 1.2 K/uL (ref 0.7–4.0)
MCH: 30.3 pg (ref 26.0–34.0)
MCHC: 33.5 g/dL (ref 30.0–36.0)
MCV: 90.5 fL (ref 80.0–100.0)
Monocytes Absolute: 0.5 K/uL (ref 0.1–1.0)
Monocytes Relative: 13 %
Neutro Abs: 1.9 K/uL (ref 1.7–7.7)
Neutrophils Relative %: 52 %
Platelet Count: 223 K/uL (ref 150–400)
RBC: 4.52 MIL/uL (ref 3.87–5.11)
RDW: 12.3 % (ref 11.5–15.5)
WBC Count: 3.7 K/uL — ABNORMAL LOW (ref 4.0–10.5)
nRBC: 0 % (ref 0.0–0.2)

## 2024-09-01 ENCOUNTER — Encounter: Payer: Self-pay | Admitting: Adult Health

## 2024-09-07 ENCOUNTER — Other Ambulatory Visit: Payer: Self-pay

## 2024-09-07 DIAGNOSIS — E559 Vitamin D deficiency, unspecified: Secondary | ICD-10-CM

## 2024-09-21 ENCOUNTER — Ambulatory Visit: Payer: Self-pay

## 2024-09-21 ENCOUNTER — Inpatient Hospital Stay: Attending: Hematology and Oncology

## 2024-09-21 DIAGNOSIS — E559 Vitamin D deficiency, unspecified: Secondary | ICD-10-CM | POA: Insufficient documentation

## 2024-09-21 DIAGNOSIS — Z1721 Progesterone receptor positive status: Secondary | ICD-10-CM | POA: Diagnosis not present

## 2024-09-21 DIAGNOSIS — Z170 Estrogen receptor positive status [ER+]: Secondary | ICD-10-CM | POA: Diagnosis not present

## 2024-09-21 DIAGNOSIS — C50411 Malignant neoplasm of upper-outer quadrant of right female breast: Secondary | ICD-10-CM | POA: Diagnosis present

## 2024-09-21 LAB — VITAMIN D 25 HYDROXY (VIT D DEFICIENCY, FRACTURES): Vit D, 25-Hydroxy: 14.9 ng/mL — ABNORMAL LOW (ref 30–100)

## 2024-09-21 NOTE — Telephone Encounter (Addendum)
 Patient Communication Note  Patient concern: Patient expressed uncertainty about tolerating the prescribed vitamin D  dosage.  Response: Discussed potential differences between prescription and OTC formulations, and reassured patient regarding monitoring.  Patient decision: Patient is open to trial of prescribed regimen.  Plan: Will monitor tolerance and adherence; follow up as needed to ensure safe and effective supplementation.  ----- Message from Morna Kendall, NP sent at 09/21/2024  3:56 PM EST ----- Her vitamin d  is low. Is she able to tolerate vitamin D ? If so I will send in a weekly prescription.

## 2024-09-22 ENCOUNTER — Other Ambulatory Visit: Payer: Self-pay

## 2024-09-22 NOTE — Telephone Encounter (Signed)
 Informed patient that the prescription was sent to the pharmacy listed in the chart. Advised her to call the cancer center with any questions

## 2024-09-22 NOTE — Telephone Encounter (Signed)
-----   Message from Morna Kendall, NP sent at 09/22/2024  1:18 PM EST ----- Please call in ergocalciferol  50,000 units weekly disp 4, 1 refill ----- Message ----- From: Wonda Guerry DASEN, LPN Sent: [id number]/81/7974   4:06 PM EST To: Morna Dalton Kendall, NP  ----- Message from Guerry DASEN Wonda, LPN sent at 87/81/7974  4:06 PM EST -----   ----- Message ----- From: Kendall Morna Dalton, NP Sent: 09/21/2024   3:56 PM EST To: [facility] 4  Her vitamin d  is low. Is she able to tolerate vitamin D ? If so I will send in a weekly prescription.

## 2024-09-25 ENCOUNTER — Ambulatory Visit: Attending: General Surgery

## 2024-09-25 VITALS — Wt 139.5 lb

## 2024-09-25 DIAGNOSIS — Z483 Aftercare following surgery for neoplasm: Secondary | ICD-10-CM | POA: Insufficient documentation

## 2024-09-25 NOTE — Therapy (Signed)
 " OUTPATIENT PHYSICAL THERAPY SOZO SCREENING NOTE   Patient Name: Christie Molina MRN: [id number] DOB:03-07-1961, 63 y.o., female Today's Date: 09/25/2024  PCP: Perri Ronal PARAS, MD REFERRING PROVIDER: Curvin Deward MOULD, MD   PT End of Session - 09/25/24 1525     Visit Number 10   # unchanged due to screen only   PT Start Time 1521    PT Stop Time 1527    PT Time Calculation (min) 6 min    Activity Tolerance Patient tolerated treatment well    Behavior During Therapy WFL for tasks assessed/performed          Past Medical History:  Diagnosis Date   Breast cancer (HCC)    right breast IDC   GERD (gastroesophageal reflux disease)    Hyperlipidemia    Hypothyroidism    Thyroid disease    Vitamin D  deficiency    Past Surgical History:  Procedure Laterality Date   BREAST LUMPECTOMY WITH RADIOACTIVE SEED AND SENTINEL LYMPH NODE BIOPSY Right 10/13/2023   Procedure: RIGHT BREAST LUMPECTOMY WITH RADIOACTIVE SEED AND SENTINEL LYMPH NODE BIOPSY;  Surgeon: Curvin Deward MOULD, MD;  Location: [facility];  Service: General;  Laterality: Right;   COLONOSCOPY WITH PROPOFOL  N/A 07/24/2014   Procedure: COLONOSCOPY WITH PROPOFOL ;  Surgeon: Gladis MARLA Louder, MD;  Location: [facility];  Service: Endoscopy;  Laterality: N/A;   UPPER GASTROINTESTINAL ENDOSCOPY  08/2018   Patient Active Problem List   Diagnosis Date Noted   Genetic testing 10/01/2023   Malignant neoplasm of upper-outer quadrant of right breast in female, estrogen receptor positive (HCC) 09/20/2023   Hyperplastic polyps of stomach 06/05/2019   Anxiety state 07/06/2013   Hypothyroidism 06/29/2011   GE reflux 06/29/2011   Hyperlipidemia 06/29/2011    REFERRING DIAG: left breast cancer at risk for lymphedema  THERAPY DIAG: Aftercare following surgery for neoplasm  PERTINENT HISTORY: Patient was diagnosed on 09/10/2023 with right grade 2 invasive ductal carcinoma breast cancer. It measures 8 mm and is located in the  upper outer quadrant. It is ER/PR positive, HER2 neg with a Ki67 of 10%. 10/13/23- R breast lumpectomy and SLNB (0/5) Completed  radiation.   PRECAUTIONS: left UE Lymphedema risk, None  SUBJECTIVE: Pt returns for her 3 month L-Dex screen. I think I want to come back to physical therapy because my breast lymphedema is still really bothering me despite doing self MLD and wearing my compression bra.  PAIN:  Are you having pain? No  SOZO SCREENING: Patient was assessed today using the SOZO machine to determine the lymphedema index score. This was compared to her baseline score. It was determined that she is within the recommended range when compared to her baseline and no further action is needed at this time. She will continue SOZO screenings. These are done every 3 months for 2 years post operatively followed by every 6 months for 2 years, and then annually.  Patient reported a change in status to PTA which initiated the PTA consulting with a PT. PT determined it would be appropriate to initiate therapy at this time. PT requested a referral from patient's provider.    L-DEX FLOWSHEETS - 09/25/24 1500       L-DEX LYMPHEDEMA SCREENING   Measurement Type Unilateral    L-DEX MEASUREMENT EXTREMITY Upper Extremity    POSITION  Standing    DOMINANT SIDE Right    At Risk Side Right    BASELINE SCORE (UNILATERAL) 1.1    L-DEX SCORE (UNILATERAL) 3.8  VALUE CHANGE (UNILAT) 2.7          P: Eval for Lt breast lymphedema. Cont every 3 month L-Dex screens until 10/2025, then can transition to every 6 months until 10/2027.  Aden Berwyn Caldron, PTA 09/25/2024, 3:27 PM     "

## 2024-09-26 ENCOUNTER — Other Ambulatory Visit: Payer: Self-pay | Admitting: Adult Health

## 2024-09-26 DIAGNOSIS — I890 Lymphedema, not elsewhere classified: Secondary | ICD-10-CM

## 2024-10-10 ENCOUNTER — Encounter: Payer: Self-pay | Admitting: Hematology and Oncology

## 2024-10-12 ENCOUNTER — Other Ambulatory Visit: Payer: Self-pay

## 2024-10-12 ENCOUNTER — Ambulatory Visit: Attending: Adult Health | Admitting: Physical Therapy

## 2024-10-12 ENCOUNTER — Encounter: Payer: Self-pay | Admitting: Physical Therapy

## 2024-10-12 DIAGNOSIS — I890 Lymphedema, not elsewhere classified: Secondary | ICD-10-CM | POA: Insufficient documentation

## 2024-10-12 DIAGNOSIS — M25611 Stiffness of right shoulder, not elsewhere classified: Secondary | ICD-10-CM | POA: Diagnosis present

## 2024-10-12 DIAGNOSIS — Z483 Aftercare following surgery for neoplasm: Secondary | ICD-10-CM | POA: Insufficient documentation

## 2024-10-12 DIAGNOSIS — M79621 Pain in right upper arm: Secondary | ICD-10-CM | POA: Diagnosis present

## 2024-10-12 DIAGNOSIS — Z170 Estrogen receptor positive status [ER+]: Secondary | ICD-10-CM | POA: Diagnosis present

## 2024-10-12 DIAGNOSIS — C50411 Malignant neoplasm of upper-outer quadrant of right female breast: Secondary | ICD-10-CM | POA: Insufficient documentation

## 2024-10-12 DIAGNOSIS — R293 Abnormal posture: Secondary | ICD-10-CM | POA: Diagnosis present

## 2024-10-12 NOTE — Therapy (Signed)
 " OUTPATIENT PHYSICAL THERAPY  UPPER EXTREMITY ONCOLOGY EVALUATION  Patient Name: Christie Molina MRN: [id number] DOB:1961/03/29, 64 y.o., female Today's Date: 10/12/2024  END OF SESSION:  PT End of Session - 10/12/24 1658     Visit Number 1    Number of Visits 9    Date for Recertification  11/09/24    PT Start Time 1602    PT Stop Time 1650    PT Time Calculation (min) 48 min    Activity Tolerance Patient tolerated treatment well    Behavior During Therapy WFL for tasks assessed/performed           Past Medical History:  Diagnosis Date   Breast cancer (HCC)    right breast IDC   GERD (gastroesophageal reflux disease)    Hyperlipidemia    Hypothyroidism    Thyroid disease    Vitamin D  deficiency    Past Surgical History:  Procedure Laterality Date   BREAST LUMPECTOMY WITH RADIOACTIVE SEED AND SENTINEL LYMPH NODE BIOPSY Right 10/13/2023   Procedure: RIGHT BREAST LUMPECTOMY WITH RADIOACTIVE SEED AND SENTINEL LYMPH NODE BIOPSY;  Surgeon: Curvin Deward MOULD, MD;  [facility];  Service: General;  Laterality: Right;   COLONOSCOPY WITH PROPOFOL  N/A 07/24/2014   Procedure: COLONOSCOPY WITH PROPOFOL ;  Surgeon: Gladis MARLA Louder, MD;  Location: [facility];  Service: Endoscopy;  Laterality: N/A;   UPPER GASTROINTESTINAL ENDOSCOPY  08/2018   Patient Active Problem List   Diagnosis Date Noted   Genetic testing 10/01/2023   Malignant neoplasm of upper-outer quadrant of right breast in female, estrogen receptor positive (HCC) 09/20/2023   Hyperplastic polyps of stomach 06/05/2019   Anxiety state 07/06/2013   Hypothyroidism 06/29/2011   GE reflux 06/29/2011   Hyperlipidemia 06/29/2011    PCP: Ronal Hailstone, MD  REFERRING PROVIDER: Morna Kendall, NP  REFERRING DIAG: I89.0 (ICD-10-CM) - Lymphedema of breast  THERAPY DIAG:  Lymphedema, not elsewhere classified - Plan: PT plan of care cert/re-cert  Pain in right upper arm - Plan: PT plan of care  cert/re-cert  Stiffness of right shoulder, not elsewhere classified - Plan: PT plan of care cert/re-cert  Aftercare following surgery for neoplasm - Plan: PT plan of care cert/re-cert  Abnormal posture - Plan: PT plan of care cert/re-cert  Malignant neoplasm of upper-outer quadrant of right breast in female, estrogen receptor positive (HCC) - Plan: PT plan of care cert/re-cert  ONSET DATE: 09/01/24  Rationale for Evaluation and Treatment: Rehabilitation  SUBJECTIVE:  SUBJECTIVE STATEMENT: My breast started swelling worse after keeping my grandson and picking him up and decorating for christmas. I have been doing self massage and wearing the compression bra but nothing has helped. My shoulder is also sore and has been since I did all of that.   PERTINENT HISTORY:  Patient was diagnosed on 09/10/2023 with right grade 2 invasive ductal carcinoma breast cancer. It measures 8 mm and is located in the upper outer quadrant. It is ER/PR positive, HER2 neg with a Ki67 of 10%. 10/13/23- R breast lumpectomy and SLNB (0/5) Completed  radiation.   PAIN:  Are you having pain? Yes 2/10, R shoulder, heaviness, lifting things and sleeping things on that side make it worse, not using it helps  PRECAUTIONS: Other: at risk of R lymphedema  RED FLAGS: None   WEIGHT BEARING RESTRICTIONS: No  FALLS:  Has patient fallen in last 6 months? No  LIVING ENVIRONMENT: Lives with: lives with their spouse Lives in: House/apartment Has following equipment at home: None  OCCUPATION: full time, chiropodist  LEISURE: does not exercise  HAND DOMINANCE: right   PRIOR LEVEL OF FUNCTION: Independent  PATIENT GOALS: to reduce the breast swelling   OBJECTIVE: Note: Objective measures were completed at Evaluation unless  otherwise noted.  COGNITION: Overall cognitive status: Within functional limits for tasks assessed   PALPATION: Fibrosis present in inferior breast  OBSERVATIONS / OTHER ASSESSMENTS: breast is about 35% larger than L breast with increased pore size noted  POSTURE: forward head, rounded shoulders  UPPER EXTREMITY AROM/PROM: WFL but does still have tightness at end range on the R  UPPER EXTREMITY STRENGTH: 4/5  LYMPHEDEMA ASSESSMENTS:   SURGERY TYPE/DATE: R breast lumpectomy on 10/13/23  NUMBER OF LYMPH NODES REMOVED: 0/5  CHEMOTHERAPY: did not require  RADIATION:completed  HORMONE TREATMENT: on anastrozole   INFECTIONS: none   LYMPHEDEMA ASSESSMENTS: recently had ldex screen which was normal   BREAST COMPLAINTS SURVEY: BREAST COMPLAINTS QUESTIONNAIRE Pain: 2 Heaviness: 5 Swollen feeling: 8 Tense Skin: 0 Redness: 0 Bra Print: 3 Size of Pores: 0 Hard feeling:  0 Total:     18/80 A Score over 9 indicates lymphedema issues in the breast   Quick Dash - 10/12/24 0001     Open a tight or new jar Moderate difficulty    Do heavy household chores (wash walls, wash floors) Mild difficulty    Carry a shopping bag or briefcase Mild difficulty    Wash your back No difficulty    Use a knife to cut food No difficulty    Recreational activities in which you take some force or impact through your arm, shoulder, or hand (golf, hammering, tennis) Mild difficulty    During the past week, to what extent has your arm, shoulder or hand problem interfered with your normal social activities with family, friends, neighbors, or groups? Slightly    During the past week, to what extent has your arm, shoulder or hand problem limited your work or other regular daily activities Not at all    Arm, shoulder, or hand pain. Mild    Tingling (pins and needles) in your arm, shoulder, or hand None    Difficulty Sleeping Mild difficulty    DASH Score 18.18 %  TREATMENT DATE:  10/12/24: In supine: Short neck, 5 diaphragmatic breaths, L axillary nodes and establishment of interaxillary pathway, R inguinal nodes and establishment of axilloinguinal pathway, then R breast moving fluid towards pathways spending extra time in any areas of fibrosis then retracing all steps. Had pt demonstrate how she has been doing breast MLD. She was mostly using her fingers so educated pt to use whole hand and to focus on skin stretch. Pt able to demonstrate improved technique afterwards.  STM using WAVE took with cocoa butter in L sidelying to R edge of lats, serratus, rhomboids, ERs with improvement in trigger points noted by end of session     PATIENT EDUCATION:  Education details: proper skin stretch technique Person educated: Patient Education method: Explanation Education comprehension: verbalized understanding  HOME EXERCISE PROGRAM: Self MLD for breast with good skin stretch technique  ASSESSMENT:  CLINICAL IMPRESSION: Patient is a 64 y.o. female who was seen today for physical therapy evaluation and treatment for R breast lymphedema and end range R shoulder stiffness and pain that extends in to R upper arm. Pt reports her breast began swelling worse after carrying her grandson and carrying Christmas decorations after Thanksgiving. It is about 35% larger than the L breast. She has increased pore size and fibrosis and always has the imprint of her bra when she removes it. She has trigger points at edge of lats and serratus. She would benefit from skilled PT services to decrease R breast lymphedema, instruct pt in independent management and instruct pt in end range R shoulder stretching to decrease tightness and scapular strengthening exercises.    OBJECTIVE IMPAIRMENTS: decreased knowledge of condition, decreased knowledge of use of DME, decreased strength, increased edema,  increased fascial restrictions, postural dysfunction, and pain.   ACTIVITY LIMITATIONS: carrying, lifting, and reach over head  PARTICIPATION LIMITATIONS: cleaning and laundry  PERSONAL FACTORS: Time since onset of injury/illness/exacerbation are also affecting patient's functional outcome.   REHAB POTENTIAL: Good  CLINICAL DECISION MAKING: Stable/uncomplicated  EVALUATION COMPLEXITY: Low  GOALS: Goals reviewed with patient? Yes  SHORT TERM GOALS=LONG TERM GOALS Target date: 11/09/24  Pt will be indepndent in self MLD for long term management of lymphedema.  Baseline: Goal status: INITIAL  2.  Pt report no pain in R UE at rest to allow improved comfort.   Baseline:  Goal status: INITIAL  3.  Pt will report a 50% improvement in feeling of heaviness in R breast to allow improved comfort.  Baseline:  Goal status: INITIAL  4.  Pt will be indepennt in a home exercise program for continued stretching and strengtheing.  Baseline:  Goal status: INITIAL   PLAN:  PT FREQUENCY: 2x/week  PT DURATION: 4 weeks  PLANNED INTERVENTIONS: 97164- PT Re-evaluation, 97110-Therapeutic exercises, 97530- Therapeutic activity, 97112- Neuromuscular re-education, 97535- Self Care, [id number]- Manual therapy, [phone]- Orthotic Initial, [phone]- Orthotic/Prosthetic subsequent, Patient/Family education, Balance training, Joint mobilization, Therapeutic exercises, Therapeutic activity, Neuromuscular re-education, Gait training, and Self Care  PLAN FOR NEXT SESSION: work on self MLD technique, STM to tight scapular musculature, scapular strengthening, give end range R shoulder stretching  [employer], PT 10/12/2024, 5:08 PM  "

## 2024-10-23 ENCOUNTER — Ambulatory Visit

## 2024-10-26 ENCOUNTER — Ambulatory Visit

## 2024-10-26 DIAGNOSIS — M79621 Pain in right upper arm: Secondary | ICD-10-CM

## 2024-10-26 DIAGNOSIS — I890 Lymphedema, not elsewhere classified: Secondary | ICD-10-CM

## 2024-10-26 DIAGNOSIS — M25611 Stiffness of right shoulder, not elsewhere classified: Secondary | ICD-10-CM

## 2024-10-26 NOTE — Therapy (Signed)
 " OUTPATIENT PHYSICAL THERAPY  UPPER EXTREMITY ONCOLOGY TREATMENT  Patient Name: Christie Molina MRN: [id number] DOB:1961/02/17, 64 y.o., female Today's Date: 10/26/2024  END OF SESSION:  PT End of Session - 10/26/24 0810     Visit Number 2    Number of Visits 9    Date for Recertification  11/09/24    Authorization Type none needed    PT Start Time 0802    PT Stop Time 0858    PT Time Calculation (min) 56 min    Activity Tolerance Patient tolerated treatment well    Behavior During Therapy [facility] for tasks assessed/performed           Past Medical History:  Diagnosis Date   Breast cancer (HCC)    right breast IDC   GERD (gastroesophageal reflux disease)    Hyperlipidemia    Hypothyroidism    Thyroid disease    Vitamin D  deficiency    Past Surgical History:  Procedure Laterality Date   BREAST LUMPECTOMY WITH RADIOACTIVE SEED AND SENTINEL LYMPH NODE BIOPSY Right 10/13/2023   Procedure: RIGHT BREAST LUMPECTOMY WITH RADIOACTIVE SEED AND SENTINEL LYMPH NODE BIOPSY;  Surgeon: Curvin Deward MOULD, MD;  Location: [facility];  Service: General;  Laterality: Right;   COLONOSCOPY WITH PROPOFOL  N/A 07/24/2014   Procedure: COLONOSCOPY WITH PROPOFOL ;  Surgeon: Gladis MARLA Louder, MD;  Location: [facility];  Service: Endoscopy;  Laterality: N/A;   UPPER GASTROINTESTINAL ENDOSCOPY  08/2018   Patient Active Problem List   Diagnosis Date Noted   Genetic testing 10/01/2023   Malignant neoplasm of upper-outer quadrant of right breast in female, estrogen receptor positive (HCC) 09/20/2023   Hyperplastic polyps of stomach 06/05/2019   Anxiety state 07/06/2013   Hypothyroidism 06/29/2011   GE reflux 06/29/2011   Hyperlipidemia 06/29/2011    PCP: Ronal Hailstone, MD  REFERRING PROVIDER: Morna Kendall, NP  REFERRING DIAG: I89.0 (ICD-10-CM) - Lymphedema of breast  THERAPY DIAG:  Lymphedema, not elsewhere classified  Pain in right upper arm  Stiffness of right shoulder, not  elsewhere classified  ONSET DATE: 09/01/24  Rationale for Evaluation and Treatment: Rehabilitation  SUBJECTIVE:                                                                                                                                                                                           SUBJECTIVE STATEMENT: I don't really know what kind of stretches to do at home. I'm wearing the compression bra but I hate the zipper in front.   PERTINENT HISTORY:  Patient was diagnosed on 09/10/2023 with right grade 2 invasive ductal carcinoma breast cancer. It measures  8 mm and is located in the upper outer quadrant. It is ER/PR positive, HER2 neg with a Ki67 of 10%. 10/13/23- R breast lumpectomy and SLNB (0/5) Completed  radiation.   PAIN:  Are you having pain? Not currently  PRECAUTIONS: Other: at risk of R lymphedema  RED FLAGS: None   WEIGHT BEARING RESTRICTIONS: No  FALLS:  Has patient fallen in last 6 months? 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  COGNITION: Overall cognitive status: Within functional limits for tasks assessed   PALPATION: Fibrosis present in inferior breast  OBSERVATIONS / OTHER ASSESSMENTS: breast is about 35% larger than L breast with increased pore size noted  POSTURE: forward head, rounded shoulders  UPPER EXTREMITY AROM/PROM: WFL but does still have tightness at end range on the R  UPPER EXTREMITY STRENGTH: 4/5  LYMPHEDEMA ASSESSMENTS:   SURGERY TYPE/DATE: R breast lumpectomy on 10/13/23  NUMBER OF LYMPH NODES REMOVED: 0/5  CHEMOTHERAPY: did not  require  RADIATION:completed  HORMONE TREATMENT: on anastrozole   INFECTIONS: none   LYMPHEDEMA ASSESSMENTS: recently had ldex screen which was normal   BREAST COMPLAINTS SURVEY: BREAST COMPLAINTS QUESTIONNAIRE Pain: 2 Heaviness: 5 Swollen feeling: 8 Tense Skin: 0 Redness: 0 Bra Print: 3 Size of Pores: 0 Hard feeling:  0 Total:     18/80 A Score over 9 indicates lymphedema issues in the breast                                                                                                                               TREATMENT DATE:  10/26/24: Self Care Spent beginning of session educating pt on importance of incorporating end ROM stretches into her day and demonstrated hold high cabinet shelf with forward lean, same in doorway (had pt return demo of this) and table slide that she could do at her desk at work. Also discussed importance of awareness of correct posture during day and how this can help improve both her ROM and lymphatic flow.  Manual Therapy In supine: Short neck, superficial and deep abdominals, Lt axillary nodes and establishment of anterior interaxillary pathway, Rt inguinal nodes and establishment of Rt axilloinguinal pathway, then Rt superior and medial breast moving fluid towards anterior pathway, then into Lt S/L for focus to lateral and inferior breast redirecting towards lateral and posterior inter-axillary anastomoses (educated her that she could instruct her husband in this if she wanted assistance with posterior pathway), then finished retracing steps in supine spending extra time in any areas of fibrosis. Instructed pt in trying S/L to allow her increased ease with reaching lateral breast and pathway and reviewed importance of skin stretch only  and light pressure as she reports she knows she still struggles with this.  STM to Rt axilla and upper arm where palpable tightness, also along lateral trunk where pt with min-mod tightness.  P/ROM in supine to Lt  shoulder into flex, abd and D2 with scapular depression throughout by therapist for STM in end motion stretches  10/12/24: In supine: Short neck, 5 diaphragmatic breaths, L axillary nodes and establishment of interaxillary pathway, R inguinal nodes and establishment of axilloinguinal pathway, then R breast moving fluid towards pathways spending extra time in any areas of fibrosis then retracing all steps. Had pt demonstrate how she has been doing breast MLD. She was mostly using her fingers so educated pt to use whole hand and to focus on skin stretch. Pt able to demonstrate improved technique afterwards.  STM using WAVE took with cocoa butter in L sidelying to R edge of lats, serratus, rhomboids, ERs with improvement in trigger points noted by end of session     PATIENT EDUCATION:  Education details: proper skin stretch technique Person educated: Patient Education method: Explanation Education comprehension: verbalized understanding  HOME EXERCISE PROGRAM: Self MLD for breast with good skin stretch technique  ASSESSMENT:  CLINICAL IMPRESSION: Spent beginning of session discussing different ways pt could incorporate stretches into her day and importance of postural awareness as well as this will help improve not only her A/ROM, but lymphatic flow as well. Also demonstrated some exs for pt like reaching to a high shelf and holding shelf with a lean for end ROM stretch. Same could be done in doorway, like when she leaves the bathroom so that would be multiple times a day, and had pt return demo of this. Also before she gets out of shower by sliding arm up wall. Pt verbalized good understanding. Then continued with MLD to Rt breast while reviewing proper skin stretch and light pressure. Encouraged her to cont with daily wear of compression bra as much as able to help reduce as much lymphatic fluid now while her body is still healing from radiation which could take up to 2 years.   OBJECTIVE  IMPAIRMENTS: decreased knowledge of condition, decreased knowledge of use of DME, decreased strength, increased edema, increased fascial restrictions, postural dysfunction, and pain.   ACTIVITY LIMITATIONS: carrying, lifting, and reach over head  PARTICIPATION LIMITATIONS: cleaning and laundry  PERSONAL FACTORS: Time since onset of injury/illness/exacerbation are also affecting patient's functional outcome.   REHAB POTENTIAL: Good  CLINICAL DECISION MAKING: Stable/uncomplicated  EVALUATION COMPLEXITY: Low  GOALS: Goals reviewed with patient? Yes  SHORT TERM GOALS=LONG TERM GOALS Target date: 11/09/24  Pt will be indepndent in self MLD for long term management of lymphedema.  Baseline: Goal status: INITIAL  2.  Pt report no pain in R UE at rest to allow improved comfort.   Baseline:  Goal status: INITIAL  3.  Pt will report a 50% improvement in feeling of heaviness in R breast to allow improved comfort.  Baseline:  Goal status: INITIAL  4.  Pt will be indepennt in a home exercise program for continued stretching and strengtheing.  Baseline:  Goal status: INITIAL   PLAN:  PT FREQUENCY: 2x/week  PT DURATION: 4 weeks  PLANNED INTERVENTIONS: 97164- PT Re-evaluation, 97110-Therapeutic exercises, 97530- Therapeutic activity, 97112- Neuromuscular re-education, 97535- Self Care, 02859- Manual therapy, [phone]- Orthotic Initial, [phone]- Orthotic/Prosthetic subsequent, Patient/Family education, Balance training, Joint mobilization, Therapeutic exercises, Therapeutic activity, Neuromuscular re-education, Gait training, and Self Care  PLAN FOR NEXT SESSION: work on self  MLD technique, STM to tight scapular musculature, scapular strengthening, review end range Rt shoulder stretching and need any handouts of stretches?   Aden Berwyn Caldron, PTA 10/26/2024, 9:18 AM  "

## 2024-11-02 ENCOUNTER — Ambulatory Visit

## 2024-11-06 ENCOUNTER — Ambulatory Visit

## 2024-11-09 ENCOUNTER — Ambulatory Visit: Admitting: Physical Therapy

## 2024-11-10 ENCOUNTER — Ambulatory Visit: Attending: General Surgery

## 2024-11-16 ENCOUNTER — Ambulatory Visit: Admitting: Physical Therapy

## 2024-11-21 ENCOUNTER — Ambulatory Visit: Attending: General Surgery | Admitting: Physical Therapy

## 2024-12-25 ENCOUNTER — Ambulatory Visit: Attending: General Surgery

## 2025-02-12 ENCOUNTER — Ambulatory Visit: Admitting: Hematology and Oncology

## 2025-02-12 ENCOUNTER — Inpatient Hospital Stay

## 2025-05-28 ENCOUNTER — Ambulatory Visit (HOSPITAL_BASED_OUTPATIENT_CLINIC_OR_DEPARTMENT_OTHER): Admitting: Obstetrics & Gynecology

## 2025-06-05 ENCOUNTER — Ambulatory Visit (HOSPITAL_BASED_OUTPATIENT_CLINIC_OR_DEPARTMENT_OTHER): Admitting: Obstetrics & Gynecology
